# Patient Record
Sex: Female | Born: 1950 | Race: White | Hispanic: No | Smoking: Current every day smoker
Health system: Southern US, Community
[De-identification: ages and names within clinical notes are randomized; demographics above are authoritative.]

## PROBLEM LIST (undated history)

## (undated) DIAGNOSIS — R7989 Other specified abnormal findings of blood chemistry: Secondary | ICD-10-CM

## (undated) DIAGNOSIS — H269 Unspecified cataract: Secondary | ICD-10-CM

## (undated) DIAGNOSIS — K5900 Constipation, unspecified: Secondary | ICD-10-CM

## (undated) DIAGNOSIS — M810 Age-related osteoporosis without current pathological fracture: Secondary | ICD-10-CM

## (undated) DIAGNOSIS — F32A Depression, unspecified: Secondary | ICD-10-CM

## (undated) DIAGNOSIS — I6529 Occlusion and stenosis of unspecified carotid artery: Secondary | ICD-10-CM

## (undated) DIAGNOSIS — F329 Major depressive disorder, single episode, unspecified: Secondary | ICD-10-CM

## (undated) DIAGNOSIS — E7800 Pure hypercholesterolemia, unspecified: Secondary | ICD-10-CM

## (undated) DIAGNOSIS — R011 Cardiac murmur, unspecified: Secondary | ICD-10-CM

## (undated) DIAGNOSIS — M1990 Unspecified osteoarthritis, unspecified site: Secondary | ICD-10-CM

## (undated) DIAGNOSIS — I10 Essential (primary) hypertension: Secondary | ICD-10-CM

## (undated) HISTORY — DX: Other specified abnormal findings of blood chemistry: R79.89

## (undated) HISTORY — PX: KNEE SURGERY: SHX244

## (undated) HISTORY — DX: Cardiac murmur, unspecified: R01.1

## (undated) HISTORY — DX: Constipation, unspecified: K59.00

## (undated) HISTORY — PX: BREAST LUMPECTOMY: SHX2

## (undated) HISTORY — DX: Occlusion and stenosis of unspecified carotid artery: I65.29

## (undated) HISTORY — PX: ANKLE SURGERY: SHX546

## (undated) HISTORY — DX: Unspecified osteoarthritis, unspecified site: M19.90

## (undated) HISTORY — PX: POLYPECTOMY: SHX149

## (undated) HISTORY — DX: Age-related osteoporosis without current pathological fracture: M81.0

## (undated) HISTORY — DX: Unspecified cataract: H26.9

---

## 1998-02-18 ENCOUNTER — Other Ambulatory Visit: Admission: RE | Admit: 1998-02-18 | Discharge: 1998-02-18 | Payer: Self-pay | Admitting: *Deleted

## 1999-03-19 ENCOUNTER — Other Ambulatory Visit: Admission: RE | Admit: 1999-03-19 | Discharge: 1999-03-19 | Payer: Self-pay | Admitting: Obstetrics and Gynecology

## 2000-04-04 ENCOUNTER — Other Ambulatory Visit: Admission: RE | Admit: 2000-04-04 | Discharge: 2000-04-04 | Payer: Self-pay | Admitting: *Deleted

## 2001-04-26 ENCOUNTER — Other Ambulatory Visit: Admission: RE | Admit: 2001-04-26 | Discharge: 2001-04-26 | Payer: Self-pay | Admitting: *Deleted

## 2002-08-22 ENCOUNTER — Other Ambulatory Visit: Admission: RE | Admit: 2002-08-22 | Discharge: 2002-08-22 | Payer: Self-pay | Admitting: Obstetrics and Gynecology

## 2003-10-23 ENCOUNTER — Other Ambulatory Visit: Admission: RE | Admit: 2003-10-23 | Discharge: 2003-10-23 | Payer: Self-pay | Admitting: Obstetrics and Gynecology

## 2007-03-31 ENCOUNTER — Ambulatory Visit: Payer: Self-pay | Admitting: Internal Medicine

## 2007-04-13 ENCOUNTER — Encounter: Payer: Self-pay | Admitting: Internal Medicine

## 2007-04-13 ENCOUNTER — Ambulatory Visit: Payer: Self-pay | Admitting: Internal Medicine

## 2009-04-04 ENCOUNTER — Ambulatory Visit (HOSPITAL_BASED_OUTPATIENT_CLINIC_OR_DEPARTMENT_OTHER): Admission: RE | Admit: 2009-04-04 | Discharge: 2009-04-04 | Payer: Self-pay | Admitting: Orthopedic Surgery

## 2011-02-01 LAB — BASIC METABOLIC PANEL
CO2: 29 mEq/L (ref 19–32)
Calcium: 9.4 mg/dL (ref 8.4–10.5)
Creatinine, Ser: 0.74 mg/dL (ref 0.4–1.2)
GFR calc Af Amer: >60 mL/min (ref >60)
GFR calc non Af Amer: >60 mL/min (ref >60)

## 2011-03-09 NOTE — Op Note (Signed)
NAMESeleena, Reimers [name], M.D.   DATE OF BIRTH:  [birth year]   DATE OF PROCEDURE:  04/04/2009  DATE OF DISCHARGE:  04/04/2009                               OPERATIVE REPORT   PREOPERATIVE DIAGNOSIS:  Medial meniscal tear.   POSTOPERATIVE DIAGNOSES:  1. Medial meniscal tear.  2. Chondromalacia patella.   PROCEDURE:  1. Knee arthroscopy with debridement of medial meniscal tear.  2. Debridement of chondromalacia patellofemoral joint.   SURGEON:  Harvie Junior, [name]  We treated  conservatively for a period of time because of left knee pain.  Because  of continued complaints of left knee pain she was ultimately taken to  the operating room for operative knee arthroscopy after failure of all  conservative care.   PROCEDURE:  The patient was taken to the operating room.  After adequate  anesthesia was obtained with general anesthetic, the patient was placed  supine on the operating table.  The left leg was prepped and draped in a  sterile fashion.  Following this, a routine arthroscopic examination of  the knee revealed there was an obvious posterior horn medial meniscal  tear which was debrided back to a smooth and stable rim.  Once this was  completed, the attention was turned to the ACL which was normal.  Attention was turned to the lateral side normal.  Attention was turned  up in the patellofemoral joint where there was some grade 2 and grade 3  change in the patellofemoral joint which was debrided to a smooth and  stable rim of articular cartilage.  At this point, attention was turned  back medially where the medial meniscal tear was done.  It was  probed at  length to make sure and felt to be stable, to make sure there was no  tendency towards subluxation of the meniscal body and then no evidence  of fragmentation of cartilage or meniscus.  At this point, the knee was  copiously and thoroughly irrigated and suctioned dry.  The arthroscopic  portals were closed with a bandage.  Sterile compressive dressing was  applied.  The patient taken to recovery room where she was noted to be  in satisfactory condition.   ESTIMATED BLOOD LOSS DURING THE PROCEDURE:  None      Harvie Junior, M.D.  Electronically Signed     JLG/MEDQ  D:  05/05/2009  T:  05/06/2009  Job:  [id number]

## 2012-02-23 ENCOUNTER — Encounter: Payer: Self-pay | Admitting: Internal Medicine

## 2013-01-24 ENCOUNTER — Encounter: Payer: Self-pay | Admitting: Internal Medicine

## 2013-02-17 ENCOUNTER — Emergency Department (HOSPITAL_BASED_OUTPATIENT_CLINIC_OR_DEPARTMENT_OTHER): Payer: No Typology Code available for payment source

## 2013-02-17 ENCOUNTER — Encounter (HOSPITAL_BASED_OUTPATIENT_CLINIC_OR_DEPARTMENT_OTHER): Payer: Self-pay

## 2013-02-17 ENCOUNTER — Emergency Department (HOSPITAL_BASED_OUTPATIENT_CLINIC_OR_DEPARTMENT_OTHER)
Admission: EM | Admit: 2013-02-17 | Discharge: 2013-02-17 | Disposition: A | Discharge status: Home / Self Care | Payer: No Typology Code available for payment source | Attending: Emergency Medicine | Admitting: Emergency Medicine

## 2013-02-17 DIAGNOSIS — R079 Chest pain, unspecified: Secondary | ICD-10-CM

## 2013-02-17 DIAGNOSIS — M542 Cervicalgia: Secondary | ICD-10-CM

## 2013-02-17 DIAGNOSIS — Y9389 Activity, other specified: Secondary | ICD-10-CM | POA: Insufficient documentation

## 2013-02-17 DIAGNOSIS — S0990XA Unspecified injury of head, initial encounter: Secondary | ICD-10-CM | POA: Insufficient documentation

## 2013-02-17 DIAGNOSIS — S298XXA Other specified injuries of thorax, initial encounter: Secondary | ICD-10-CM | POA: Insufficient documentation

## 2013-02-17 DIAGNOSIS — Y92410 Unspecified street and highway as the place of occurrence of the external cause: Secondary | ICD-10-CM | POA: Insufficient documentation

## 2013-02-17 DIAGNOSIS — Z79899 Other long term (current) drug therapy: Secondary | ICD-10-CM | POA: Insufficient documentation

## 2013-02-17 DIAGNOSIS — I10 Essential (primary) hypertension: Secondary | ICD-10-CM | POA: Insufficient documentation

## 2013-02-17 HISTORY — DX: Essential (primary) hypertension: I10

## 2013-02-17 MED ORDER — METHOCARBAMOL 500 MG PO TABS: 500.0000 mg | ORAL_TABLET | Freq: Two times a day (BID) | ORAL | Status: DC

## 2013-02-17 MED ORDER — IBUPROFEN 800 MG PO TABS: 800.0000 mg | ORAL_TABLET | Freq: Three times a day (TID) | ORAL | Status: DC
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
## 2013-02-17 NOTE — ED Provider Notes (Signed)
Medical screening examination/treatment/procedure(s) were performed by non-physician practitioner and as supervising physician I was immediately available for consultation/collaboration.  Shelda Jakes, MD 02/17/13 [phone]

## 2013-02-21 ENCOUNTER — Encounter (HOSPITAL_BASED_OUTPATIENT_CLINIC_OR_DEPARTMENT_OTHER): Payer: Self-pay | Admitting: *Deleted

## 2013-02-21 ENCOUNTER — Emergency Department (HOSPITAL_BASED_OUTPATIENT_CLINIC_OR_DEPARTMENT_OTHER)
Admission: EM | Admit: 2013-02-21 | Discharge: 2013-02-21 | Disposition: A | Discharge status: Home / Self Care | Payer: Federal, State, Local not specified - PPO | Attending: Emergency Medicine | Admitting: Emergency Medicine

## 2013-02-21 DIAGNOSIS — H538 Other visual disturbances: Secondary | ICD-10-CM | POA: Insufficient documentation

## 2013-02-21 DIAGNOSIS — Z79899 Other long term (current) drug therapy: Secondary | ICD-10-CM | POA: Insufficient documentation

## 2013-02-21 DIAGNOSIS — I10 Essential (primary) hypertension: Secondary | ICD-10-CM | POA: Insufficient documentation

## 2013-02-21 DIAGNOSIS — H5789 Other specified disorders of eye and adnexa: Secondary | ICD-10-CM | POA: Insufficient documentation

## 2013-02-21 DIAGNOSIS — H02846 Edema of left eye, unspecified eyelid: Secondary | ICD-10-CM

## 2013-02-21 MED ORDER — DIPHENHYDRAMINE HCL 25 MG PO CAPS
50.0000 mg | ORAL_CAPSULE | Freq: Once | ORAL | Status: AC
  Administered 2013-02-21: 50 mg via ORAL
  Filled 2013-02-21: qty 2

## 2013-02-21 NOTE — ED Provider Notes (Signed)
  I performed a history and physical examination of Stacy Duncan and discussed her management with Dr. Elwyn Reach.  I agree with the history, physical, assessment, and plan of care, with the following exceptions: None  I was present for the following procedures: None Time Spent in Critical Care of the patient: None Time spent in discussions with the patient and family: 10    Stacy Axton Corlis Leak, MD 02/21/13 1328

## 2013-02-21 NOTE — ED Notes (Signed)
Pt declines hospital gown.
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
## 2013-05-16 ENCOUNTER — Encounter: Payer: Self-pay | Admitting: Internal Medicine

## 2013-06-12 DIAGNOSIS — F341 Dysthymic disorder: Secondary | ICD-10-CM | POA: Insufficient documentation

## 2013-07-10 ENCOUNTER — Encounter: Payer: Self-pay | Admitting: Internal Medicine

## 2013-07-18 ENCOUNTER — Ambulatory Visit (AMBULATORY_SURGERY_CENTER): Payer: Self-pay | Admitting: *Deleted

## 2013-07-18 VITALS — Ht 65.0 in | Wt 239.8 lb

## 2013-07-18 DIAGNOSIS — Z800 Family history of malignant neoplasm of digestive organs: Secondary | ICD-10-CM

## 2013-07-18 MED ORDER — NA SULFATE-K SULFATE-MG SULF 17.5-3.13-1.6 GM/177ML PO SOLN: ORAL | Status: DC

## 2013-07-18 NOTE — Progress Notes (Signed)
Patient denies any allergies to eggs or soy. Patient denies any problems with anesthesia.  

## 2013-07-20 ENCOUNTER — Encounter: Payer: Self-pay | Admitting: Internal Medicine

## 2013-07-24 ENCOUNTER — Telehealth: Payer: Self-pay | Admitting: Internal Medicine

## 2013-07-24 NOTE — Telephone Encounter (Signed)
Called into [facility]on [facility]. in [place]. a new Rx for Suprep 1 kit ,take as directed / per pt's request. Left message for the pt the RX was called into the pharmacy and to call our office if she has any problems.Cherylann Ratel

## 2013-07-31 ENCOUNTER — Ambulatory Visit (AMBULATORY_SURGERY_CENTER): Payer: Federal, State, Local not specified - PPO | Admitting: Internal Medicine

## 2013-07-31 ENCOUNTER — Encounter: Payer: Self-pay | Admitting: Internal Medicine

## 2013-07-31 VITALS — BP 155/77 | HR 60 | Temp 98.0°F | Resp 15 | Ht 65.0 in | Wt 239.0 lb

## 2013-07-31 DIAGNOSIS — D126 Benign neoplasm of colon, unspecified: Secondary | ICD-10-CM

## 2013-07-31 DIAGNOSIS — Z1211 Encounter for screening for malignant neoplasm of colon: Secondary | ICD-10-CM

## 2013-07-31 DIAGNOSIS — K5730 Diverticulosis of large intestine without perforation or abscess without bleeding: Secondary | ICD-10-CM

## 2013-07-31 DIAGNOSIS — K644 Residual hemorrhoidal skin tags: Secondary | ICD-10-CM

## 2013-07-31 DIAGNOSIS — K648 Other hemorrhoids: Secondary | ICD-10-CM

## 2013-07-31 DIAGNOSIS — Z800 Family history of malignant neoplasm of digestive organs: Secondary | ICD-10-CM

## 2013-07-31 MED ORDER — SODIUM CHLORIDE 0.9 % IV SOLN: 500.0000 mL | INTRAVENOUS | Status: DC

## 2013-07-31 NOTE — Patient Instructions (Addendum)
I found and removed one tiny polyp. It looks benign.   You also have a condition called diverticulosis - common and not usually a problem. Please read the handout provided. Hemorrhoids also seen.   I will let you know pathology results and when to have another routine colonoscopy by mail.  If you have hemorrhoid problems (swelling, itching, bleeding) I am able to treat those with an in-office procedure. If you like, please call my office at [phone] to schedule an appointment and I can evaluate you further.  I appreciate the opportunity to care for you. Iva Boop, MD, FACG   YOU HAD AN ENDOSCOPIC PROCEDURE TODAY AT THE [facility]: Refer to the procedure report that was given to you for any specific questions about what was found during the examination.  If the procedure report does not answer your questions, please call your gastroenterologist to clarify.  If you requested that your care partner not be given the details of your procedure findings, then the procedure report has been included in a sealed envelope for you to review at your convenience later.  YOU SHOULD EXPECT: Some feelings of bloating in the abdomen. Passage of more gas than usual.  Walking can help get rid of the air that was put into your GI tract during the procedure and reduce the bloating. If you had a lower endoscopy (such as a colonoscopy or flexible sigmoidoscopy) you may notice spotting of blood in your stool or on the toilet paper. If you underwent a bowel prep for your procedure, then you may not have a normal bowel movement for a few days.  DIET: Your first meal following the procedure should be a light meal and then it is ok to progress to your normal diet.  A half-sandwich or bowl of soup is an example of a good first meal.  Heavy or fried foods are harder to digest and may make you feel nauseous or bloated.  Likewise meals heavy in dairy and vegetables can cause extra gas to form and this can also  increase the bloating.  Drink plenty of fluids but you should avoid alcoholic beverages for 24 hours.  ACTIVITY: Your care partner should take you home directly after the procedure.  You should plan to take it easy, moving slowly for the rest of the day.  You can resume normal activity the day after the procedure however you should NOT DRIVE or use heavy machinery for 24 hours (because of the sedation medicines used during the test).    SYMPTOMS TO REPORT IMMEDIATELY: A gastroenterologist can be reached at any hour.  During normal business hours, 8:30 AM to 5:00 PM Monday through Friday, call [phone].  After hours and on weekends, please call the GI answering service at [phone]  Emergency number who will take a message and have the physician on call contact you.   Following lower endoscopy (colonoscopy or flexible sigmoidoscopy):  Excessive amounts of blood in the stool  Significant tenderness or worsening of abdominal pains  Swelling of the abdomen that is new, acute  Fever of 100F or higher F FOLLOW UP: If any biopsies were taken you will be contacted by phone or by letter within the next 1-3 weeks.  Call your gastroenterologist if you have not heard about the biopsies in 3 weeks.  Our staff will call the home number listed on your records the next business day following your procedure to check on you and address any questions or concerns  that you may have at that time regarding the information given to you following your procedure. This is a courtesy call and so if there is no answer at the home number and we have not heard from you through the emergency physician on call, we will assume that you have returned to your regular daily activities without incident.  SIGNATURES/CONFIDENTIALITY: You and/or your care partner have signed paperwork which will be entered into your electronic medical record.  These signatures attest to the fact that that the information above on your After  Visit Summary has been reviewed and is understood.  Full responsibility of the confidentiality of this discharge information lies with you and/or your care-partner.

## 2013-07-31 NOTE — Op Note (Signed)
[facility] [address] [place], [address]   COLONOSCOPY PROCEDURE REPORT  PATIENT: Stacy Duncan, Stacy Duncan  MR#: [id number] BIRTHDATE: 01/05/1951 , 61  yrs. old GENDER: Female ENDOSCOPIST: Iva Boop, MD, [facility] PROCEDURE DATE:  07/31/2013 PROCEDURE:   Colonoscopy with biopsy First Screening Colonoscopy - Avg.  risk and is 50 yrs.  old or older - No.  Prior Negative Screening - Now for repeat screening. N/A  History of Adenoma - Now for follow-up colonoscopy & has been > or = to 3 yrs.  N/A  Polyps Removed Today? Yes. ASA CLASS:   Class II INDICATIONS:Patient's immediate family history of colon cancer. MEDICATIONS: propofol (Diprivan) 250mg  IV, MAC sedation, administered by CRNA, and These medications were titrated to patient response per physician's verbal order  DESCRIPTION OF PROCEDURE:   After the risks benefits and alternatives of the procedure were thoroughly explained, informed consent was obtained.  A digital rectal exam revealed no abnormalities of the rectum.   The LB PFC-H190 [id number] and LB CF-H180AL Loaner [id number]  endoscope was introduced through the anus and advanced to the cecum, which was identified by both the appendix and ileocecal valve. No adverse events experienced.   The quality of the prep was excellent using Suprep  The instrument was then slowly withdrawn as the colon was fully examined.   COLON FINDINGS: A sessile polyp measuring 3 mm in size was found in the transverse colon.  A polypectomy was performed with cold forceps.  The resection was complete and the polyp tissue was completely retrieved.   Moderate diverticulosis was noted in the sigmoid colon.   The colon mucosa was otherwise normal.   A right colon retroflexion was performed.  Retroflexed views revealed internal/external hemorrhoids. The time to cecum=2 minutes 13 seconds.  Withdrawal time=7 minutes 49 seconds.  The scope was withdrawn and the procedure  completed. COMPLICATIONS: There were no complications.  ENDOSCOPIC IMPRESSION: 1.   Sessile polyp measuring 3 mm in size was found in the transverse colon; polypectomy was performed with cold forceps 2.   Moderate diverticulosis was noted in the sigmoid colon 3.   The colon mucosa was otherwise normal 4.   Internal hemorrhoids 5.   External hemorrhoids  RECOMMENDATIONS: Timing of repeat colonoscopy will be determined by pathology findings.   eSigned:  Iva Boop, MD, [facility] 07/31/2013 11:04 AM  cc: The Patient and Nolon Nations, MD

## 2013-07-31 NOTE — Progress Notes (Signed)
Called to room to assist during endoscopic procedure.  Patient ID and intended procedure confirmed with present staff. Received instructions for my participation in the procedure from the performing physician.  

## 2013-08-01 ENCOUNTER — Telehealth: Payer: Self-pay | Admitting: *Deleted

## 2013-08-01 NOTE — Telephone Encounter (Signed)
  Follow up Call-  Call back number 07/31/2013  Post procedure Call Back phone  # [phone]  Permission to leave phone message Yes     Patient questions:  Do you have a fever, pain , or abdominal swelling? no Pain Score  0 *  Have you tolerated food without any problems? yes  Have you been able to return to your normal activities? yes  Do you have any questions about your discharge instructions: Diet   no Medications  no Follow up visit  no  Do you have questions or concerns about your Care? no  Actions: * If pain score is 4 or above: No action needed, pain <4.

## 2013-08-08 ENCOUNTER — Encounter: Payer: Self-pay | Admitting: Internal Medicine

## 2013-08-08 DIAGNOSIS — Z860100 Personal history of colon polyps, unspecified: Secondary | ICD-10-CM | POA: Insufficient documentation

## 2013-08-08 DIAGNOSIS — Z800 Family history of malignant neoplasm of digestive organs: Secondary | ICD-10-CM | POA: Insufficient documentation

## 2013-08-08 NOTE — Progress Notes (Signed)
Quick Note:  Diminutive adenoma and FHx CRCA Repeat colonoscopy 2019 ______

## 2015-09-30 DIAGNOSIS — K8020 Calculus of gallbladder without cholecystitis without obstruction: Secondary | ICD-10-CM | POA: Insufficient documentation

## 2015-09-30 DIAGNOSIS — I6523 Occlusion and stenosis of bilateral carotid arteries: Secondary | ICD-10-CM | POA: Insufficient documentation

## 2016-10-26 DIAGNOSIS — E559 Vitamin D deficiency, unspecified: Secondary | ICD-10-CM | POA: Insufficient documentation

## 2016-10-27 ENCOUNTER — Ambulatory Visit: Payer: Medicare Other | Attending: Family Medicine | Admitting: Physical Therapy

## 2016-10-27 DIAGNOSIS — M25562 Pain in left knee: Secondary | ICD-10-CM | POA: Insufficient documentation

## 2016-10-27 DIAGNOSIS — R2689 Other abnormalities of gait and mobility: Secondary | ICD-10-CM | POA: Insufficient documentation

## 2016-10-27 DIAGNOSIS — G8929 Other chronic pain: Secondary | ICD-10-CM | POA: Diagnosis present

## 2016-10-27 DIAGNOSIS — R262 Difficulty in walking, not elsewhere classified: Secondary | ICD-10-CM | POA: Diagnosis present

## 2016-10-27 DIAGNOSIS — M6281 Muscle weakness (generalized): Secondary | ICD-10-CM | POA: Insufficient documentation

## 2016-10-27 DIAGNOSIS — M25561 Pain in right knee: Secondary | ICD-10-CM | POA: Diagnosis present

## 2016-10-27 NOTE — Therapy (Signed)
[facility] [address]  [place], [place], [address] Phone: [phone]   Fax:  [phone]  Physical Therapy Evaluation  Patient Details  Name: Stacy Duncan MRN: [id number] Date of Birth: [birth year] Referring Provider: Gentry Fitz, MD  Encounter Date: 10/27/2016      PT End of Session - 10/27/16 1201    Visit Number 1   Number of Visits 12   Date for PT Re-Evaluation 12/24/16   Authorization Type Medicare/Federal BCBS   Authorization - Number of Visits [place]   PT Start Time 1106   PT Stop Time 1148   PT Time Calculation (min) 42 min   Activity Tolerance Patient tolerated treatment well;Patient limited by pain   Behavior During Therapy [facility] for tasks assessed/performed      Past Medical History:  Diagnosis Date  . Cataract   . Hypertension     Past Surgical History:  Procedure Laterality Date  . ANKLE SURGERY    . BREAST LUMPECTOMY    . KNEE SURGERY      There were no vitals filed for this visit.       Subjective Assessment - 10/27/16 1110    Subjective Pt reports h/o L knee mensical tear which required arthroscopic surgery in ~2009. 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    Pertinent History L knee scope for meniscal tear 2009   Limitations Standing;Walking   How long can you stand comfortably? 15 minutes   How long can you walk comfortably? 15 minutes   Diagnostic tests x-rays taken at MD office show OA (per pt)   Patient Stated Goals "to get my balance back and walk better"   Currently in Pain? Yes   Pain Score --  6-7/10; least 4/10, avg 6-7/10, worst 10/10   Pain Location Knee   Pain Orientation Left   Pain Descriptors / Indicators Jabbing;Dull   Pain Type Chronic pain   Pain Radiating Towards n/a   Pain Onset More than a month ago  ~1 yr   Pain Frequency Constant   Aggravating Factors  activity - standing    Pain Relieving Factors ice, rest/elevation   Effect of Pain on Daily Activities needs to walk with cane, pain causes her to feel unsteady   Multiple Pain Sites Yes   Pain Score --  6-7/10; least 3/10, avg 5-6/10, worst 10/10   Pain Location Knee   Pain Orientation Right   Pain Descriptors / Indicators Sharp  grinding   Pain Type Chronic pain   Pain Radiating Towards down toward ankle (h/o ankle fracture)   Pain Onset More than a month ago  6 mo -1 yr   Pain Frequency Constant   Aggravating Factors  activity - standing   Pain Relieving Factors ice, rest/elevation   Effect of Pain on Daily Activities needs to walk with cane, pain causes her to feel unsteady            [facility] PT Assessment - 10/27/16 1106      Assessment   Medical Diagnosis B knee pain & arthritis   Referring Provider Gentry Fitz, MD   Onset Date/Surgical Date --  6 mo - 1 yr   Next MD Visit TBD   Prior Therapy remote h/o of OP PT after knee scope     Precautions   Precautions Fall     Balance Screen  Has the patient fallen in the past 6 months Yes   How many times? 1   Has the patient had a decrease in activity level because of a fear of falling?  Yes   Is the patient reluctant to leave their home because of a fear of falling?  No     Home Environment   Living Environment Private residence   Type of [place] Access Stairs to enter   Entrance Stairs-Number of Steps 2   Entrance Stairs-Rails Left   Home Layout One level   [place] - single point     Prior Function   Level of Independence Independent   Vocation Retired     Observation/Other Assessments   Focus on Therapeutic Outcomes (FOTO)  Knee - 30% (70% limitation); predicted 45% (55% limitation)     ROM / Strength   AROM / PROM / Strength AROM;PROM;Strength     AROM   AROM Assessment Site Knee   Right/Left Knee Right;Left   Right Knee Extension 2   Right Knee Flexion 119   Left Knee Extension 16   Left  Knee Flexion 100     PROM   PROM Assessment Site Knee   Right/Left Knee Right;Left   Right Knee Extension -3   Right Knee Flexion 126   Left Knee Extension -2   Left Knee Flexion 126     Strength   Strength Assessment Site Hip;Knee   Right/Left Hip Right;Left   Right Hip Flexion 4/5   Right Hip Extension 3+/5   Right Hip External Rotation  4-/5   Right Hip Internal Rotation 3+/5   Right Hip ABduction 4-/5   Right Hip ADduction 4/5   Left Hip Flexion 4/5   Left Hip Extension 4-/5   Left Hip External Rotation 4-/5   Left Hip Internal Rotation 3+/5   Left Hip ABduction 4-/5   Left Hip ADduction 4-/5   Right/Left Knee Right;Left   Right Knee Flexion 4-/5   Right Knee Extension 4/5   Left Knee Flexion 4-/5   Left Knee Extension 4/5     Flexibility   Soft Tissue Assessment /Muscle Length yes   Hamstrings mildly tight B   ITB mildly tight B   Piriformis mildly tight B     Ambulation/Gait   Assistive device Straight cane   Gait Pattern Decreased weight shift to left;Decreased arm swing - left;Decreased hip/knee flexion - left;Decreased hip/knee flexion - right;Poor foot clearance - left;Poor foot clearance - right;Antalgic   Ambulation Surface Level;Indoor                           PT Education - 10/27/16 1200    Education provided Yes   Education Details PT eval findings & POC   Person(s) Educated Patient   Methods Explanation   Comprehension Verbalized understanding          PT Short Term Goals - 10/27/16 1225      PT SHORT TERM GOAL #1   Title Independent with initial HEP by 11/19/16   Status New           PT Long Term Goals - 10/27/16 1225      PT LONG TERM GOAL #1   Title Independent with advanced HEP by 12/24/16   Status New     PT LONG TERM GOAL #2   Title B hip & knee strength >/= 4/5 for improved joint stability by  12/24/16   Status New     PT LONG TERM GOAL #3   Title B knee AROM to w/in 5 dg of PROM to promote normalized  gait and stair negotiation by 12/24/16   Status New     PT LONG TERM GOAL #4   Title Pt able to demonstrate normal gait pattern w/o AD by 12/24/16   Status New     PT LONG TERM GOAL #5   Title Pt will report overall improvement in B knee pain by 50% by 12/24/16   Status New               Plan - November 05, 2016 1205    Clinical Impression Statement Stacy Duncan is a 66 y/o female who presents to OP PT for B knee pain from arthritis. Pt reports pain has worsened over the past 6 months to a year and limits her ability to walk and requires her to use a SPC. Pt also notes difficulty with transfers sit to/from stand. Pt reports pain is constant in B knees but varies with activity. Assessment reveals deficits in AROM as compared to available PROM along with B LE weakness (refer to above flowsheet). Pt demonstrates significant gait deviations from pain and weakness but hopes to be able to wean from cane. POC will focus on restoring full AROM, core/LE strengthening to improve joint stability and balance, with modalities PRN for pain management.   Rehab Potential Good   Clinical Impairments Affecting Rehab Potential h/o R ankle fracture   PT Frequency 2x / week   PT Duration 6 weeks   PT Treatment/Interventions Patient/family education;Therapeutic exercise;Therapeutic activities;Functional mobility training;Neuromuscular re-education;Gait training;Stair training;Manual techniques;Taping;Passive range of motion;Electrical Stimulation;Cryotherapy;Vasopneumatic Device;ADLs/Self Care Home Management   PT Next Visit Plan Initiate HEP; B knee AROM; LE flexibility; Core/LE strengthening; Gait training to normalize gait pattern; Modalities PRN for pain   Consulted and Agree with Plan of Care Patient      Patient will benefit from skilled therapeutic intervention in order to improve the following deficits and impairments:  Pain, Decreased strength, Decreased range of motion, Impaired flexibility, Abnormal gait, Difficulty  walking, Decreased activity tolerance, Decreased balance, Decreased mobility  Visit Diagnosis: Chronic pain of left knee  Chronic pain of right knee  Muscle weakness (generalized)  Difficulty in walking, not elsewhere classified  Other abnormalities of gait and mobility      G-Codes - 11-05-2016 1155    Functional Assessment Tool Used Knee - 30% (70% limitation); predicted 45% (55% limitation)   Functional Limitation Mobility: Walking and moving around   Mobility: Walking and Moving Around Current Status [id number]) At least 60 percent but less than 80 percent impaired, limited or restricted   Mobility: Walking and Moving Around Goal Status [phone]) At least 40 percent but less than 60 percent impaired, limited or restricted       Problem List Patient Active Problem List   Diagnosis Date Noted  . Personal history of colonic adenoma 08/08/2013  . Family history of colon cancer 08/08/2013    Percival Spanish, PT, MPT 11/05/2016, 12:43 PM  [facility] [address]  [place], [place], [address] Phone: [phone]   Fax:  [phone]  Name: Stacy Duncan MRN: [id number] Date of Birth: Jan 12, 1951

## 2016-10-28 ENCOUNTER — Ambulatory Visit: Payer: Medicare Other

## 2016-10-28 DIAGNOSIS — M6281 Muscle weakness (generalized): Secondary | ICD-10-CM

## 2016-10-28 DIAGNOSIS — M25562 Pain in left knee: Secondary | ICD-10-CM | POA: Diagnosis not present

## 2016-10-28 DIAGNOSIS — M25561 Pain in right knee: Secondary | ICD-10-CM

## 2016-10-28 DIAGNOSIS — R2689 Other abnormalities of gait and mobility: Secondary | ICD-10-CM

## 2016-10-28 DIAGNOSIS — R262 Difficulty in walking, not elsewhere classified: Secondary | ICD-10-CM

## 2016-10-28 DIAGNOSIS — G8929 Other chronic pain: Secondary | ICD-10-CM

## 2016-10-28 NOTE — Therapy (Deleted)
[facility] [address]  [place], [place], [address] Phone: [phone]   Fax:  [phone]  Physical Therapy Treatment  Patient Details  Name: Stacy Duncan MRN: [id number] Date of Birth: [birth year] Referring Provider: Gentry Fitz, MD  Encounter Date: 10/28/2016      PT End of Session - 10/28/16 1110    Visit Number 2   Number of Visits 12   Date for PT Re-Evaluation 12/24/16   Authorization Type Medicare/Federal BCBS   Authorization - Number of Visits 75  Federal [employer]   PT Start Time 1104   PT Stop Time 1150   PT Time Calculation (min) 46 min   Activity Tolerance Patient tolerated treatment well;Patient limited by pain   Behavior During Therapy [facility] for tasks assessed/performed      Past Medical History:  Diagnosis Date  . Cataract   . Hypertension     Past Surgical History:  Procedure Laterality Date  . ANKLE SURGERY    . BREAST LUMPECTOMY    . KNEE SURGERY      There were no vitals filed for this visit.      Subjective Assessment - 10/28/16 1107    Subjective Pt. reporting R knee pain is worse than L today.       Patient Stated Goals "to get my balance back and walk better"   Currently in Pain? Yes   Pain Score 4    Pain Location Knee   Pain Orientation Right   Pain Descriptors / Indicators Jabbing;Dull   Pain Type Chronic pain   Pain Onset More than a month ago   Pain Frequency Intermittent   Multiple Pain Sites No                         [facility] Adult PT Treatment/Exercise - 10/28/16 1210      Knee/Hip Exercises: Aerobic   Nustep NuStep: lvl 3, 6 min      Knee/Hip Exercises: Supine   Hip Adduction Isometric AROM;Both;10 reps   Hip Adduction Isometric Limitations 5" hold with ball squeeze  5" hold; added to HEP    Bridges AROM;1 set;10 reps;Both  3" hold; added to HEP    Bridges with [employer] AROM;1 set;10 reps;Both  5" hold; added to HEP    Bridges  with [employer] AROM;Right;Left;1 set;10 reps  with green TB alternating hip abd/ER; added to HEP    Other Supine Knee/Hip Exercises Hooklying hip abduction/ER with green TB x 10 reps each side   added to HEP      Knee/Hip Exercises: Sidelying   Clams with green TB 3" hold   added to HEP      Kinesiotix   Create Space Chrondromalacia patellar pattern to B knees - 2 strips 30% medial and lateral to patella crossing at tibial tuberosity & distal quad + horiz strip 50% across patellar tendon                  PT Short Term Goals - 10/28/16 1110      PT SHORT TERM GOAL #1   Title Independent with initial HEP by 11/19/16   Status On-going           PT Long Term Goals - 10/28/16 1121      PT LONG TERM GOAL #1   Title Independent with advanced HEP by 12/24/16   Status On-going     PT LONG TERM GOAL #  2   Title B hip & knee strength >/= 4/5 for improved joint stability by 12/24/16   Status On-going     PT LONG TERM GOAL #3   Title B knee AROM to w/in 5 dg of PROM to promote normalized gait and stair negotiation by 12/24/16   Status On-going     PT LONG TERM GOAL #4   Title Pt able to demonstrate normal gait pattern w/o AD by 12/24/16   Status On-going     PT LONG TERM GOAL #5   Title Pt will report overall improvement in B knee pain by 50% by 12/24/16   Status On-going               Plan - 10/28/16 1127    Clinical Impression Statement Today's treatment focused on HEP creation/demonstration for hip/LE strengthening.  Bridge and bridge variations included in HEP today with pt. able to perform all.  Pt. with bilateral HS cramping x 2 today requiring rest break with therex however these quickly subsided.  Pt. agreeing to trial of taping to bilateral knees thus taping applied to Bilateral knees applied in chondromalacia patella pattern.  Will plan to monitor response to B knee taping and initial HEP next visit.      PT Treatment/Interventions Patient/family  education;Therapeutic exercise;Therapeutic activities;Functional mobility training;Neuromuscular re-education;Gait training;Stair training;Manual techniques;Taping;Passive range of motion;Electrical Stimulation;Cryotherapy;Vasopneumatic Device;ADLs/Self Care Home Management   PT Next Visit Plan Initiate HEP; B knee AROM; LE flexibility; Core/LE strengthening; Gait training to normalize gait pattern; Modalities PRN for pain      Patient will benefit from skilled therapeutic intervention in order to improve the following deficits and impairments:  Pain, Decreased strength, Decreased range of motion, Impaired flexibility, Abnormal gait, Difficulty walking, Decreased activity tolerance, Decreased balance, Decreased mobility  Visit Diagnosis: Chronic pain of left knee  Chronic pain of right knee  Muscle weakness (generalized)  Difficulty in walking, not elsewhere classified  Other abnormalities of gait and mobility     Problem List Patient Active Problem List   Diagnosis Date Noted  . Personal history of colonic adenoma 08/08/2013  . Family history of colon cancer 08/08/2013    Bess Harvest, PTA 10/28/16 12:17 PM  [facility] [address]  [place], [place], [address] Phone: [phone]   Fax:  [phone]  Name: Stacy Duncan MRN: [id number] Date of Birth: August 09, 1951

## 2016-10-28 NOTE — Therapy (Signed)
[facility] [address]  [place], [place], [address] Phone: [phone]   Fax:  [phone]  Physical Therapy Treatment  Patient Details  Name: Stacy Duncan MRN: [id number] Date of Birth: 11-22-1950 Referring Provider: Gentry Fitz, MD  Encounter Date: 10/28/2016      PT End of Session - 10/28/16 1110    Visit Number 2   Number of Visits 12   Date for PT Re-Evaluation 12/24/16   Authorization Type Medicare/Federal BCBS   Authorization - Number of Visits 75  Federal [employer]BS   PT Start Time 1104   PT Stop Time 1150   PT Time Calculation (min) 46 min   Activity Tolerance Patient tolerated treatment well;Patient limited by pain   Behavior During Therapy [facility] for tasks assessed/performed      Past Medical History:  Diagnosis Date  . Cataract   . Hypertension     Past Surgical History:  Procedure Laterality Date  . ANKLE SURGERY    . BREAST LUMPECTOMY    . KNEE SURGERY      There were no vitals filed for this visit.      Subjective Assessment - 10/28/16 1107    Subjective Pt. reporting R knee pain is worse than L today.       Patient Stated Goals "to get my balance back and walk better"   Currently in Pain? Yes   Pain Score 4    Pain Location Knee   Pain Orientation Right   Pain Descriptors / Indicators Jabbing;Dull   Pain Type Chronic pain   Pain Onset More than a month ago   Pain Frequency Intermittent   Multiple Pain Sites No            [facility] Adult PT Treatment/Exercise - 10/28/16 1210      Knee/Hip Exercises: Aerobic   Nustep NuStep: lvl 3, 6 min      Knee/Hip Exercises: Supine   Hip Adduction Isometric AROM;Both;10 reps   Hip Adduction Isometric Limitations 5" hold with ball squeeze  5" hold; added to HEP    Bridges AROM;1 set;10 reps;Both  3" hold; added to HEP    Bridges with [employer] AROM;1 set;10 reps;Both  5" hold; added to HEP    Bridges with [employer]  AROM;Right;Left;1 set;10 reps  with green TB alternating hip abd/ER; added to HEP    Other Supine Knee/Hip Exercises Hooklying hip abduction/ER with green TB x 10 reps each side   added to HEP      Knee/Hip Exercises: Sidelying   Clams with green TB 3" hold   added to HEP      Kinesiotix   Create Space Chrondromalacia patellar pattern to B knees - 2 strips 30% medial and lateral to patella crossing at tibial tuberosity & distal quad + horiz strip 50% across patellar tendon           PT Education - 10/28/16 1225    Education provided Yes   Education Details seated hip flexion march, bridge, bridge adduction ball squeeze, bridge with sustained hip abd/ER with green TB issued to pt., Hooklying alternating hip abd/ER with green TB    Person(s) Educated Patient   Methods Explanation;Demonstration;Verbal cues;Handout   Comprehension Verbalized understanding;Returned demonstration;Verbal cues required;Need further instruction          PT Short Term Goals - 10/28/16 1110      PT SHORT TERM GOAL #1   Title Independent with initial HEP by 11/19/16  Status On-going           PT Long Term Goals - 10/28/16 1121      PT LONG TERM GOAL #1   Title Independent with advanced HEP by 12/24/16   Status On-going     PT LONG TERM GOAL #2   Title B hip & knee strength >/= 4/5 for improved joint stability by 12/24/16   Status On-going     PT LONG TERM GOAL #3   Title B knee AROM to w/in 5 dg of PROM to promote normalized gait and stair negotiation by 12/24/16   Status On-going     PT LONG TERM GOAL #4   Title Pt able to demonstrate normal gait pattern w/o AD by 12/24/16   Status On-going     PT LONG TERM GOAL #5   Title Pt will report overall improvement in B knee pain by 50% by 12/24/16   Status On-going               Plan - 10/28/16 1127    Clinical Impression Statement Today's treatment focused on HEP creation/demonstration for hip/LE strengthening.  Bridge and bridge  variations included in HEP today with pt. able to perform all.  Pt. with bilateral HS cramping x 2 today requiring rest break with therex however these quickly subsided.  Pt. agreeing to trial of taping to bilateral knees thus taping applied to Bilateral knees applied in chondromalacia patella pattern.  Will plan to monitor response to B knee taping and initial HEP next visit.      PT Treatment/Interventions Patient/family education;Therapeutic exercise;Therapeutic activities;Functional mobility training;Neuromuscular re-education;Gait training;Stair training;Manual techniques;Taping;Passive range of motion;Electrical Stimulation;Cryotherapy;Vasopneumatic Device;ADLs/Self Care Home Management   PT Next Visit Plan Review initial HEP; B knee AROM; LE flexibility; Core/LE strengthening; Gait training to normalize gait pattern; Modalities PRN for pain      Patient will benefit from skilled therapeutic intervention in order to improve the following deficits and impairments:  Pain, Decreased strength, Decreased range of motion, Impaired flexibility, Abnormal gait, Difficulty walking, Decreased activity tolerance, Decreased balance, Decreased mobility  Visit Diagnosis: Chronic pain of left knee  Chronic pain of right knee  Muscle weakness (generalized)  Difficulty in walking, not elsewhere classified  Other abnormalities of gait and mobility     Problem List Patient Active Problem List   Diagnosis Date Noted  . Personal history of colonic adenoma 08/08/2013  . Family history of colon cancer 08/08/2013    Bess Harvest, PTA 10/28/16 12:29 PM  [facility] [address]  [place], [place], [address] Phone: [phone]   Fax:  [phone]  Name: Stacy Duncan MRN: [id number] Date of Birth: [birth year]

## 2016-11-01 ENCOUNTER — Ambulatory Visit: Payer: Medicare Other

## 2016-11-01 DIAGNOSIS — G8929 Other chronic pain: Secondary | ICD-10-CM

## 2016-11-01 DIAGNOSIS — R2689 Other abnormalities of gait and mobility: Secondary | ICD-10-CM

## 2016-11-01 DIAGNOSIS — M25562 Pain in left knee: Secondary | ICD-10-CM | POA: Diagnosis not present

## 2016-11-01 DIAGNOSIS — R262 Difficulty in walking, not elsewhere classified: Secondary | ICD-10-CM

## 2016-11-01 DIAGNOSIS — M6281 Muscle weakness (generalized): Secondary | ICD-10-CM

## 2016-11-01 DIAGNOSIS — M25561 Pain in right knee: Secondary | ICD-10-CM

## 2016-11-01 NOTE — Therapy (Signed)
[facility] [address]  [place], [place], [address] Phone: [phone]   Fax:  [phone]  Physical Therapy Treatment  Patient Details  Name: Stacy Duncan MRN: [id number] Date of Birth: [birth year] Referring Provider: Gentry Fitz, MD  Encounter Date: 11/01/2016      PT End of Session - 11/01/16 1021    Visit Number 3   Number of Visits 12   Date for PT Re-Evaluation 12/24/16   Authorization Type Medicare/Federal BCBS   Authorization - Number of Visits 75  Federal BCBS   PT Start Time 1014   PT Stop Time 1100   PT Time Calculation (min) 46 min   Activity Tolerance Patient tolerated treatment well;Patient limited by pain   Behavior During Therapy [facility] for tasks assessed/performed      Past Medical History:  Diagnosis Date  . Cataract   . Hypertension     Past Surgical History:  Procedure Laterality Date  . ANKLE SURGERY    . BREAST LUMPECTOMY    . KNEE SURGERY      There were no vitals filed for this visit.      Subjective Assessment - 11/01/16 1018    Subjective Pt. reporting she has been performing the HEP without issue over weekend.   Pt. reporting plans to be gone from therapy January 14-22nd.     Patient Stated Goals "to get my balance back and walk better"   Pain Score 6    Pain Location Knee   Pain Orientation Left   Pain Descriptors / Indicators Jabbing;Dull   Pain Type Chronic pain   Pain Onset More than a month ago   Pain Frequency Intermittent   Multiple Pain Sites No            [facility] Adult PT Treatment/Exercise - 11/01/16 1043      Knee/Hip Exercises: Stretches   Active Hamstring Stretch 1 rep;30 seconds   Active Hamstring Stretch Limitations with strap; added to HEP   ITB Stretch 1 rep;30 seconds   ITB Stretch Limitations with strap; added to HEP    Piriformis Stretch 1 rep;30 seconds   Piriformis Stretch Limitations added to HEP; mod piriformis; Bilaterally       Knee/Hip Exercises: Aerobic   Nustep NuStep: lvl 6, 7 min      Knee/Hip Exercises: Seated   Long Arc Quad AROM;10 reps   Long Arc Quad Weight 3 lbs.   Long [employer] Limitations with adduction ball squeeze; Bilaterally   Other Seated Knee/Hip Exercises Seated B fitter leg press (1 black band) x 10 reps     Knee/Hip Exercises: Supine   Hip Adduction Isometric AROM;Both;15 reps   Hip Adduction Isometric Limitations 3" hold    Bridges with Diona Foley Squeeze AROM;1 set;Both;15 reps   Bridges with Clamshell AROM;Right;Left;1 set  x 12 reps; with green TB      Knee/Hip Exercises: Sidelying   Clams x 12 repswith green TB 3" hold      Kinesiotix   Create Space --  B horizontal strips lateral to medial over tibial tuberosity                PT Education - 11/01/16 1035    Education provided Yes   Education Details B ITB stretch, B HS stretch, B modified piriformis stretch (pt. has strap)   Person(s) Educated Patient   Methods Explanation;Demonstration;Verbal cues;Handout   Comprehension Verbalized understanding;Returned demonstration;Verbal cues required;Need further instruction  PT Short Term Goals - 10/28/16 1110      PT SHORT TERM GOAL #1   Title Independent with initial HEP by 11/19/16   Status On-going           PT Long Term Goals - 10/28/16 1121      PT LONG TERM GOAL #1   Title Independent with advanced HEP by 12/24/16   Status On-going     PT LONG TERM GOAL #2   Title B hip & knee strength >/= 4/5 for improved joint stability by 12/24/16   Status On-going     PT LONG TERM GOAL #3   Title B knee AROM to w/in 5 dg of PROM to promote normalized gait and stair negotiation by 12/24/16   Status On-going     PT LONG TERM GOAL #4   Title Pt able to demonstrate normal gait pattern w/o AD by 12/24/16   Status On-going     PT LONG TERM GOAL #5   Title Pt will report overall improvement in B knee pain by 50% by 12/24/16   Status On-going                Plan - 11/01/16 1038    Clinical Impression Statement Pt. reporting being able to perform initial strengthening HEP everyday with green looped TB without issue over weekend.  Pt. noting benefit from B knee taping with taping still intact with exception of reapplication of cross strips today.  Pt. reporting plans to be gone from therapy January 14-22nd.  Supine/seated hip and knee strengthening therex was the focus on today's visit with pt. tolerating all well.  L knee pain > R today however pain nearly unchanged with therex today.  All strengthening therex advanced today with addition of seated fitter leg press and LAQ 3#.  Pt. issued handout for LE flexibility HEP today and reports she has a strap at home to assist with stretches.  Pt. continues to be compliant and motivated with therapy at this point and will continues to benefit from therapy to improve LE strength, flexibility, and maximize function.     PT Treatment/Interventions Patient/family education;Therapeutic exercise;Therapeutic activities;Functional mobility training;Neuromuscular re-education;Gait training;Stair training;Manual techniques;Taping;Passive range of motion;Electrical Stimulation;Cryotherapy;Vasopneumatic Device;ADLs/Self Care Home Management   PT Next Visit Plan Review stretching HEP; B knee AROM; LE flexibility; Core/LE strengthening; Gait training to normalize gait pattern; Modalities PRN for pain      Patient will benefit from skilled therapeutic intervention in order to improve the following deficits and impairments:  Pain, Decreased strength, Decreased range of motion, Impaired flexibility, Abnormal gait, Difficulty walking, Decreased activity tolerance, Decreased balance, Decreased mobility  Visit Diagnosis: Chronic pain of left knee  Chronic pain of right knee  Muscle weakness (generalized)  Difficulty in walking, not elsewhere classified  Other abnormalities of gait and mobility     Problem List Patient  Active Problem List   Diagnosis Date Noted  . Personal history of colonic adenoma 08/08/2013  . Family history of colon cancer 08/08/2013    Bess Harvest, PTA 11/01/16 11:38 AM  [facility] [address]  [place], [place], [address] Phone: [phone]   Fax:  [phone]  Name: Stacy Duncan MRN: [id number] Date of Birth: [birth year]

## 2016-11-04 ENCOUNTER — Ambulatory Visit: Payer: Medicare Other | Admitting: Physical Therapy

## 2016-11-04 DIAGNOSIS — M25562 Pain in left knee: Principal | ICD-10-CM

## 2016-11-04 DIAGNOSIS — M25561 Pain in right knee: Secondary | ICD-10-CM

## 2016-11-04 DIAGNOSIS — G8929 Other chronic pain: Secondary | ICD-10-CM

## 2016-11-04 DIAGNOSIS — R2689 Other abnormalities of gait and mobility: Secondary | ICD-10-CM

## 2016-11-04 DIAGNOSIS — R262 Difficulty in walking, not elsewhere classified: Secondary | ICD-10-CM

## 2016-11-04 DIAGNOSIS — M6281 Muscle weakness (generalized): Secondary | ICD-10-CM

## 2016-11-04 NOTE — Therapy (Signed)
[facility] [address]  [place], [place], [address] Phone: [phone]   Fax:  [phone]  Physical Therapy Treatment  Patient Details  Name: Stacy Duncan MRN: [id number] Date of Birth: [birth year] Referring Provider: Gentry Fitz, MD  Encounter Date: 11/04/2016      PT End of Session - 11/04/16 1107    Visit Number 4   Number of Visits 12   Date for PT Re-Evaluation 12/24/16   Authorization Type Medicare/Federal BCBS   Authorization - Number of Visits 75   PT Start Time 1100   PT Stop Time 1148   PT Time Calculation (min) 48 min   Activity Tolerance Patient tolerated treatment well   Behavior During Therapy [facility] for tasks assessed/performed      Past Medical History:  Diagnosis Date  . Cataract   . Hypertension     Past Surgical History:  Procedure Laterality Date  . ANKLE SURGERY    . BREAST LUMPECTOMY    . KNEE SURGERY      There were no vitals filed for this visit.      Subjective Assessment - 11/04/16 1103    Subjective Pt reports she had reaction to pain medication and has hemorrhoids so has been unable to perform HEP. Both knees felt better with the tape following last session. L Knee is feeling painful today.   Patient Stated Goals "to get my balance back and walk better"   Currently in Pain? Yes   Pain Score 7    Pain Location Knee   Pain Orientation Left   Pain Descriptors / Indicators Dull;Sharp   Pain Frequency Intermittent                         [facility] Adult PT Treatment/Exercise - 11/04/16 1059      Knee/Hip Exercises: Stretches   Passive Hamstring Stretch 1 rep;30 seconds;Both   Passive Hamstring Stretch Limitations supine with strap    ITB Stretch 1 rep;30 seconds;Both   ITB Stretch Limitations supine with strap   Piriformis Stretch 1 rep;30 seconds;Both     Knee/Hip Exercises: Aerobic   Nustep NuStep: lvl 6 x 7'     Knee/Hip Exercises: Standing    Knee Flexion Both;1 set;10 reps   Knee Flexion Limitations  2#; counter for support     Knee/Hip Exercises: Seated   Other Seated Knee/Hip Exercises Seated B fitter leg press; 1 black  x 10 reps     Knee/Hip Exercises: Supine   Bridges with Clamshell AROM;Right;Left;1 set;10 reps   Straight Leg Raises Both;1 set;10 reps   Straight Leg Raises Limitations 2#      Knee/Hip Exercises: Sidelying   Clams x 10 reps with green TB 3" hold     Kinesiotix   Create Space Chrondromalacia patellar pattern to B knees - 2 strips 30% medial and lateral to patella crossing at tibial tuberosity & distal quad + horiz strip 50% across patellar tendon                  PT Short Term Goals - 10/28/16 1110      PT SHORT TERM GOAL #1   Title Independent with initial HEP by 11/19/16   Status On-going           PT Long Term Goals - 10/28/16 1121      PT LONG TERM GOAL #1   Title Independent with advanced HEP by 12/24/16  Status On-going     PT LONG TERM GOAL #2   Title B hip & knee strength >/= 4/5 for improved joint stability by 12/24/16   Status On-going     PT LONG TERM GOAL #3   Title B knee AROM to w/in 5 dg of PROM to promote normalized gait and stair negotiation by 12/24/16   Status On-going     PT LONG TERM GOAL #4   Title Pt able to demonstrate normal gait pattern w/o AD by 12/24/16   Status On-going     PT LONG TERM GOAL #5   Title Pt will report overall improvement in B knee pain by 50% by 12/24/16   Status On-going               Plan - 11/04/16 1146    Clinical Impression Statement Pt reported being unable to perform HEP over the last few days due to not feeling well. Pt was able to recall stretches recently added to HEP as well as receive clarification on a few exercises from the original HEP. Pt was able to tolerate new exercises focusing on hip & knee strengthening without an increase in pain during or following the session. Pt will be out of town next week but was  reminded to continue with current HEP. Pt reports that knees feel much better with walking following taping for chondromalacia patella.   Rehab Potential Good   PT Treatment/Interventions Patient/family education;Therapeutic exercise;Therapeutic activities;Functional mobility training;Neuromuscular re-education;Gait training;Stair training;Manual techniques;Taping;Passive range of motion;Electrical Stimulation;Cryotherapy;Vasopneumatic Device;ADLs/Self Care Home Management   PT Next Visit Plan Review how HEP went over break; progression of Core/LE strengthening; gait training to normalize gait pattern; modalities & manual therapy PRN for pain.   Consulted and Agree with Plan of Care Patient      Patient will benefit from skilled therapeutic intervention in order to improve the following deficits and impairments:  Pain, Decreased strength, Decreased range of motion, Impaired flexibility, Abnormal gait, Difficulty walking, Decreased activity tolerance, Decreased balance, Decreased mobility  Visit Diagnosis: Chronic pain of left knee  Chronic pain of right knee  Muscle weakness (generalized)  Difficulty in walking, not elsewhere classified  Other abnormalities of gait and mobility     Problem List Patient Active Problem List   Diagnosis Date Noted  . Personal history of colonic adenoma 08/08/2013  . Family history of colon cancer 08/08/2013    Lauralee Evener, SPT 11/04/2016, 1:21 PM  [facility] [address]  [place], [place], [address] Phone: [phone]   Fax:  [phone]  Name: Stacy Duncan MRN: [id number] Date of Birth: April 15, 1951

## 2016-11-17 ENCOUNTER — Ambulatory Visit: Payer: Medicare Other | Admitting: Physical Therapy

## 2016-11-17 DIAGNOSIS — M25562 Pain in left knee: Principal | ICD-10-CM

## 2016-11-17 DIAGNOSIS — G8929 Other chronic pain: Secondary | ICD-10-CM

## 2016-11-17 DIAGNOSIS — R2689 Other abnormalities of gait and mobility: Secondary | ICD-10-CM

## 2016-11-17 DIAGNOSIS — M25561 Pain in right knee: Secondary | ICD-10-CM

## 2016-11-17 DIAGNOSIS — R262 Difficulty in walking, not elsewhere classified: Secondary | ICD-10-CM

## 2016-11-17 DIAGNOSIS — M6281 Muscle weakness (generalized): Secondary | ICD-10-CM

## 2016-11-17 NOTE — Therapy (Signed)
[facility] [address]  [place], [place], [address] Phone: [phone]   Fax:  [phone]  Physical Therapy Treatment  Patient Details  Name: Stacy Duncan MRN: [id number] Date of Birth: [birth year] Referring Provider: Gentry Fitz, MD  Encounter Date: 11/17/2016      PT End of Session - 11/17/16 1109    Visit Number 5   Number of Visits 12   Date for PT Re-Evaluation 12/24/16   Authorization Type Medicare/Federal BCBS   Authorization - Number of Visits 32   PT Start Time 1103   PT Stop Time 1148   PT Time Calculation (min) 45 min   Activity Tolerance Patient tolerated treatment well   Behavior During Therapy [facility] for tasks assessed/performed      Past Medical History:  Diagnosis Date  . Cataract   . Hypertension     Past Surgical History:  Procedure Laterality Date  . ANKLE SURGERY    . BREAST LUMPECTOMY    . KNEE SURGERY      There were no vitals filed for this visit.      Subjective Assessment - 11/17/16 1107    Subjective Pt reports she has been sick over the past week and also rolled her L ankle on the ice. She states that her foot feels more flat than normal and that the ankle pain is a 6-7/10 which is an improvement from last Tuesday when it occurred. She has not gone to see anyone yet for her ankle. She reports her knees have been feeling "weak and wobbly". Pt reports she was able to do bridges and the hamstring stretch while away from therapy last week but was unable to do the other stretches & exercises due to being sick. Pt reports she did feel stronger and could tell a difference following last session.   Patient Stated Goals "to get my balance back and walk better"   Currently in Pain? Yes   Pain Score 4    Pain Location Knee   Pain Orientation Left   Pain Descriptors / Indicators Dull;Sharp   Pain Type Chronic pain   Pain Onset More than a month ago   Pain Frequency  Intermittent   Aggravating Factors  activity- standing   Pain Relieving Factors ice, rest/elevation   Effect of Pain on Daily Activities uses cane to steady when coming out of chair   Pain Score 4   Pain Location Knee   Pain Orientation Right   Pain Descriptors / Indicators Sharp   Pain Type Chronic pain   Pain Onset More than a month ago   Pain Frequency Constant   Aggravating Factors  activity- standing   Pain Relieving Factors ice, rest/elevation   Effect of Pain on Daily Activities uses cane to steady when getting out of chair                         [facility] Adult PT Treatment/Exercise - 11/17/16 1103      Knee/Hip Exercises: Aerobic   Nustep lvl 6 x 7'     Knee/Hip Exercises: Standing   Functional Squat 10 reps;3 seconds   Functional Squat Limitations Counter for B UE support   Other Standing Knee Exercises Side stepping with yellow band; 20' each side     Knee/Hip Exercises: Supine   Bridges Both;10 reps   Bridges Limitations 1st set: legs extended on peanut ball, 5 sec holds; 2nd set: hamstring  curl with legs on peanut ball (performed 2 reps before pain in knee occurred)     Knee/Hip Exercises: Sidelying   Hip ABduction Both;15 reps   Hip ABduction Limitations 3" holds, 2# ankle weight     Kinesiotix   Create Space Chrondromalacia patellar pattern to B knees - 2 strips 30% medial and lateral to patella crossing at tibial tuberosity & distal quad + horiz strip 50% across patellar tendon                  PT Short Term Goals - 10/28/16 1110      PT SHORT TERM GOAL #1   Title Independent with initial HEP by 11/19/16   Status On-going           PT Long Term Goals - 10/28/16 1121      PT LONG TERM GOAL #1   Title Independent with advanced HEP by 12/24/16   Status On-going     PT LONG TERM GOAL #2   Title B hip & knee strength >/= 4/5 for improved joint stability by 12/24/16   Status On-going     PT LONG TERM GOAL #3   Title B knee AROM  to w/in 5 dg of PROM to promote normalized gait and stair negotiation by 12/24/16   Status On-going     PT LONG TERM GOAL #4   Title Pt able to demonstrate normal gait pattern w/o AD by 12/24/16   Status On-going     PT LONG TERM GOAL #5   Title Pt will report overall improvement in B knee pain by 50% by 12/24/16   Status On-going               Plan - 11/17/16 1148    Clinical Impression Statement Pt was unable to perform HEP while gone from therapy last week due to having the flu. She was able to do the hamstring stretch and bridges a few times but not regularly. She sprained her L ankle last Tuesday on the ice and reports the pain being 6-7/10. Pt was able to tolerate progression of core/LE strengthening with only a few standing exercises included due to the L ankle sprain.  She was able to tolerate the few standing exercises well with only minimal knee pain when performing squats. She required cueing during squats to keep her knees behind her toes and to remain in an upright posture. She did not have any increased pain following treatment.    Rehab Potential Good   Clinical Impairments Affecting Rehab Potential h/o R ankle fracture   PT Treatment/Interventions Patient/family education;Therapeutic exercise;Therapeutic activities;Functional mobility training;Neuromuscular re-education;Gait training;Stair training;Manual techniques;Taping;Passive range of motion;Electrical Stimulation;Cryotherapy;Vasopneumatic Device;ADLs/Self Care Home Management   PT Next Visit Plan Assess short term goal; continue with progression of core/LE strengthening; try to introduce more standing exercises if able to tolerate; modalities and manual therapy PRN for pain   Consulted and Agree with Plan of Care Patient      Patient will benefit from skilled therapeutic intervention in order to improve the following deficits and impairments:  Pain, Decreased strength, Decreased range of motion, Impaired flexibility,  Abnormal gait, Difficulty walking, Decreased activity tolerance, Decreased balance, Decreased mobility  Visit Diagnosis: Chronic pain of left knee  Chronic pain of right knee  Muscle weakness (generalized)  Difficulty in walking, not elsewhere classified  Other abnormalities of gait and mobility     Problem List Patient Active Problem List   Diagnosis Date Noted  . Personal history  of colonic adenoma 08/08/2013  . Family history of colon cancer 08/08/2013    Lauralee Evener, SPT 11/17/2016, 12:18 PM  [facility] [address]  [place], [place], [address] Phone: [phone]   Fax:  [phone]  Name: Tamantha Sedberry MRN: [id number] Date of Birth: 1951/06/04

## 2016-11-19 ENCOUNTER — Ambulatory Visit: Payer: Medicare Other

## 2016-11-19 DIAGNOSIS — G8929 Other chronic pain: Secondary | ICD-10-CM

## 2016-11-19 DIAGNOSIS — M25562 Pain in left knee: Principal | ICD-10-CM

## 2016-11-19 DIAGNOSIS — M25561 Pain in right knee: Secondary | ICD-10-CM

## 2016-11-19 DIAGNOSIS — M6281 Muscle weakness (generalized): Secondary | ICD-10-CM

## 2016-11-19 DIAGNOSIS — R2689 Other abnormalities of gait and mobility: Secondary | ICD-10-CM

## 2016-11-19 DIAGNOSIS — R262 Difficulty in walking, not elsewhere classified: Secondary | ICD-10-CM

## 2016-11-19 NOTE — Therapy (Signed)
[facility] [address]  [place], [place], [address] Phone: [phone]   Fax:  [phone]  Physical Therapy Treatment  Patient Details  Name: Stacy Duncan MRN: [id number] Date of Birth: 1951-05-30 Referring Provider: Gentry Fitz, MD  Encounter Date: 11/19/2016      PT End of Session - 11/19/16 1109    Visit Number 6   Number of Visits 12   Date for PT Re-Evaluation 12/24/16   Authorization Type Medicare/Federal BCBS   Authorization - Number of Visits 75   PT Start Time 1105   PT Stop Time 1150   PT Time Calculation (min) 45 min   Activity Tolerance Patient tolerated treatment well   Behavior During Therapy [facility] for tasks assessed/performed      Past Medical History:  Diagnosis Date  . Cataract   . Hypertension     Past Surgical History:  Procedure Laterality Date  . ANKLE SURGERY    . BREAST LUMPECTOMY    . KNEE SURGERY      There were no vitals filed for this visit.      Subjective Assessment - 11/19/16 1109    Subjective Pt. reporting she has been more consistent with HEP since being sick two weeks ago.     Patient Stated Goals "to get my balance back and walk better"   Currently in Pain? Yes   Pain Score 6    Pain Location Knee   Pain Orientation Left   Pain Descriptors / Indicators Dull;Sharp   Pain Type Chronic pain   Pain Onset More than a month ago   Pain Frequency Intermittent   Aggravating Factors  use, prolonged walking   Pain Relieving Factors ice, rest/   Multiple Pain Sites No           [facility] Adult PT Treatment/Exercise - 11/19/16 1130      Knee/Hip Exercises: Aerobic   Nustep lvl 6 x 6'     Knee/Hip Exercises: Standing   Hip Flexion AROM;Stengthening;Right;Left;1 set;10 reps;Knee straight   Hip Flexion Limitations holding onto counter   Hip Abduction AROM;Right;Left;1 set;10 reps;Knee straight   Abduction Limitations Holding onto counter    Hip Extension  AROM;Stengthening;Right;Left;1 set;10 reps;Knee straight   Extension Limitations holding onto counter    Functional Squat 10 reps;3 seconds   Functional Squat Limitations Counter for B UE support; verbal cues provided proper LE spacing     Knee/Hip Exercises: Seated   Long Arc Quad AROM;15 reps   Long Arc Quad Weight 3 lbs.   Long [employer] Limitations with adduction ball squeeze; Bilaterally   Other Seated Knee/Hip Exercises Seated B fitter leg press; (1 black, 1blue) x 15 reps     Knee/Hip Exercises: Supine   Bridges with Ball Squeeze AROM;Both;1 set;15 reps   Bridges with Clamshell AROM;Strengthening;Right;Left;1 set;15 reps  with green TB    Straight Leg Raises --   Straight Leg Raises Limitations --   Other Supine Knee/Hip Exercises Hooklying alteranting hip abd/ER with green TB x 15 reps each side      Knee/Hip Exercises: Sidelying   Clams x 15 reps with green TB 3" hold            PT Short Term Goals - 11/19/16 1124      PT SHORT TERM GOAL #1   Title Independent with initial HEP by 11/19/16   Status Achieved  1.26.18: pt. excellent recall of initial HEP  PT Long Term Goals - 10/28/16 1121      PT LONG TERM GOAL #1   Title Independent with advanced HEP by 12/24/16   Status On-going     PT LONG TERM GOAL #2   Title B hip & knee strength >/= 4/5 for improved joint stability by 12/24/16   Status On-going     PT LONG TERM GOAL #3   Title B knee AROM to w/in 5 dg of PROM to promote normalized gait and stair negotiation by 12/24/16   Status On-going     PT LONG TERM GOAL #4   Title Pt able to demonstrate normal gait pattern w/o AD by 12/24/16   Status On-going     PT LONG TERM GOAL #5   Title Pt will report overall improvement in B knee pain by 50% by 12/24/16   Status On-going               Plan - 11/19/16 1114    Clinical Impression Statement Pt. still noting benefit from B knee taping at this point with taping still intact today.  Today's  treatment focused on full HEP review with demonstration of all strengthening activities to check for appropriate difficulty level following sickness with flue two weeks ago.  Pt. still reporting appropriate difficulty level with green TB with HEP with muscular soreness following performance.  Today's treatment with more standing hip strengthening activities, which was tolerated well.  Pt. with complaint of B knee stiffness and aching pain throughout treatment today with all therex.  Will plan to continue progressing strengthening activities per pt. tolerance in coming visits.    PT Treatment/Interventions Patient/family education;Therapeutic exercise;Therapeutic activities;Functional mobility training;Neuromuscular re-education;Gait training;Stair training;Manual techniques;Taping;Passive range of motion;Electrical Stimulation;Cryotherapy;Vasopneumatic Device;ADLs/Self Care Home Management   PT Next Visit Plan Continue with progression of core/LE strengthening; try to introduce more standing exercises if able to tolerate; modalities and manual therapy PRN for pain      Patient will benefit from skilled therapeutic intervention in order to improve the following deficits and impairments:  Pain, Decreased strength, Decreased range of motion, Impaired flexibility, Abnormal gait, Difficulty walking, Decreased activity tolerance, Decreased balance, Decreased mobility  Visit Diagnosis: Chronic pain of left knee  Chronic pain of right knee  Muscle weakness (generalized)  Difficulty in walking, not elsewhere classified  Other abnormalities of gait and mobility     Problem List Patient Active Problem List   Diagnosis Date Noted  . Personal history of colonic adenoma 08/08/2013  . Family history of colon cancer 08/08/2013    Bess Harvest, PTA 11/19/16 12:09 PM   [facility] [address]  [place], [place], [address] Phone:  [phone]   Fax:  [phone]  Name: Stacy Duncan MRN: [id number] Date of Birth: 04/26/1951

## 2016-11-22 ENCOUNTER — Ambulatory Visit: Payer: Medicare Other

## 2016-11-22 DIAGNOSIS — M6281 Muscle weakness (generalized): Secondary | ICD-10-CM

## 2016-11-22 DIAGNOSIS — M25562 Pain in left knee: Principal | ICD-10-CM

## 2016-11-22 DIAGNOSIS — M25561 Pain in right knee: Secondary | ICD-10-CM

## 2016-11-22 DIAGNOSIS — R2689 Other abnormalities of gait and mobility: Secondary | ICD-10-CM

## 2016-11-22 DIAGNOSIS — R262 Difficulty in walking, not elsewhere classified: Secondary | ICD-10-CM

## 2016-11-22 DIAGNOSIS — G8929 Other chronic pain: Secondary | ICD-10-CM

## 2016-11-22 NOTE — Therapy (Signed)
[facility] [address]  [place], [place], [address] Phone: [phone]   Fax:  [phone]  Physical Therapy Treatment  Patient Details  Name: Stacy Duncan MRN: [id number] Date of Birth: 01-Aug-1951 Referring Provider: Gentry Fitz, MD  Encounter Date: 11/22/2016      PT End of Session - 11/22/16 1108    Visit Number 7   Number of Visits 12   Date for PT Re-Evaluation 12/24/16   Authorization Type Medicare/Federal BCBS   Authorization - Number of Visits 25   PT Start Time 1103   PT Stop Time 1152   PT Time Calculation (min) 49 min   Activity Tolerance Patient tolerated treatment well   Behavior During Therapy [facility] for tasks assessed/performed      Past Medical History:  Diagnosis Date  . Cataract   . Hypertension     Past Surgical History:  Procedure Laterality Date  . ANKLE SURGERY    . BREAST LUMPECTOMY    . KNEE SURGERY      There were no vitals filed for this visit.      Subjective Assessment - 11/22/16 1105    Subjective Pt. reporting she was able to get to the HEP over weekend and felt good following last treatment.  Pt. still noting benefit from B taping however noting taping still intact with exception of R LE cross-strip.   Patient Stated Goals "to get my balance back and walk better"   Currently in Pain? Yes   Pain Score 5    Pain Location Knee   Pain Orientation Right;Left   Pain Descriptors / Indicators Dull;Sharp   Pain Type Chronic pain   Pain Onset More than a month ago   Pain Frequency Intermittent   Multiple Pain Sites No           [facility] Adult PT Treatment/Exercise - 11/22/16 1113      Knee/Hip Exercises: Aerobic   Nustep lvl 7 x 6'     Knee/Hip Exercises: Standing   Hip Flexion AROM;Stengthening;Right;Left;1 set;10 reps;Knee straight   Hip Flexion Limitations holding onto counter; with yellow looped TB   Hip Abduction AROM;Right;Left;1 set;10 reps;Knee  straight   Abduction Limitations Holding onto counter; with yellow looped TB    Hip Extension AROM;Stengthening;Right;Left;1 set;10 reps;Knee straight   Extension Limitations holding onto counter; with yellow TB    Functional Squat 3 seconds;15 reps   Functional Squat Limitations Counter for B UE support    Other Standing Knee Exercises Side stepping with red TB 2 x 20 ft; monster walk with red TB 2 x 20 ft      Knee/Hip Exercises: Seated   Long Arc Quad AROM;15 reps   Long Arc Quad Weight 4 lbs.   Long [employer] Limitations with adduction ball squeeze; Bilaterally   Other Seated Knee/Hip Exercises Seated B fitter leg press; (1 black, 1blue) x 20 reps   Hamstring Curl AROM;Strengthening;Right;Left;15 reps   Hamstring Limitations with black looped TB aroun ankles and anchored by therapist            PT Short Term Goals - 11/19/16 1124      PT SHORT TERM GOAL #1   Title Independent with initial HEP by 11/19/16   Status Achieved  1.26.18: pt. excellent recall of initial HEP           PT Long Term Goals - 10/28/16 1121      PT LONG TERM GOAL #1  Title Independent with advanced HEP by 12/24/16   Status On-going     PT LONG TERM GOAL #2   Title B hip & knee strength >/= 4/5 for improved joint stability by 12/24/16   Status On-going     PT LONG TERM GOAL #3   Title B knee AROM to w/in 5 dg of PROM to promote normalized gait and stair negotiation by 12/24/16   Status On-going     PT LONG TERM GOAL #4   Title Pt able to demonstrate normal gait pattern w/o AD by 12/24/16   Status On-going     PT LONG TERM GOAL #5   Title Pt will report overall improvement in B knee pain by 50% by 12/24/16   Status On-going               Plan - 11/22/16 1117    Clinical Impression Statement Pt. still noting benefit from B knee taping with taping still intact with exception of R LE cross-strip.  R LE cross-strip reapplied today.  Today's treatment focused on hip/knee strengthening activity  with advancement of side stepping and monster walk.  Pt. tolerating therex well reporting appropriate fatigue following treatment.  Pt. will continue to benefit from further skilled therapy to improve LE strength and functional capacity.      PT Treatment/Interventions Patient/family education;Therapeutic exercise;Therapeutic activities;Functional mobility training;Neuromuscular re-education;Gait training;Stair training;Manual techniques;Taping;Passive range of motion;Electrical Stimulation;Cryotherapy;Vasopneumatic Device;ADLs/Self Care Home Management   PT Next Visit Plan Continue with progression of core/LE strengthening; try to introduce more standing exercises if able to tolerate; modalities and manual therapy PRN for pain      Patient will benefit from skilled therapeutic intervention in order to improve the following deficits and impairments:  Pain, Decreased strength, Decreased range of motion, Impaired flexibility, Abnormal gait, Difficulty walking, Decreased activity tolerance, Decreased balance, Decreased mobility  Visit Diagnosis: Chronic pain of left knee  Chronic pain of right knee  Muscle weakness (generalized)  Difficulty in walking, not elsewhere classified  Other abnormalities of gait and mobility     Problem List Patient Active Problem List   Diagnosis Date Noted  . Personal history of colonic adenoma 08/08/2013  . Family history of colon cancer 08/08/2013    Bess Harvest, PTA 11/22/16 12:18 PM  [facility] [address]  [place], [place], [address] Phone: [phone]   Fax:  [phone]  Name: Stacy Duncan MRN: [id number] Date of Birth: 1951/04/12

## 2016-11-25 ENCOUNTER — Ambulatory Visit: Payer: Medicare Other | Attending: Family Medicine

## 2016-11-25 DIAGNOSIS — M6281 Muscle weakness (generalized): Secondary | ICD-10-CM | POA: Diagnosis present

## 2016-11-25 DIAGNOSIS — M25561 Pain in right knee: Secondary | ICD-10-CM | POA: Diagnosis present

## 2016-11-25 DIAGNOSIS — M25562 Pain in left knee: Secondary | ICD-10-CM | POA: Diagnosis not present

## 2016-11-25 DIAGNOSIS — R2689 Other abnormalities of gait and mobility: Secondary | ICD-10-CM | POA: Diagnosis present

## 2016-11-25 DIAGNOSIS — G8929 Other chronic pain: Secondary | ICD-10-CM | POA: Diagnosis present

## 2016-11-25 DIAGNOSIS — R262 Difficulty in walking, not elsewhere classified: Secondary | ICD-10-CM | POA: Insufficient documentation

## 2016-11-25 NOTE — Therapy (Signed)
[facility] [address]  [place], [place], [address] Phone: [phone]   Fax:  [phone]  Physical Therapy Treatment  Patient Details  Name: Stacy Duncan MRN: [id number] Date of Birth: 09/03/1951 Referring Provider: Gentry Fitz, MD  Encounter Date: 11/25/2016      PT End of Session - 11/25/16 1152    Visit Number 8   Number of Visits 12   Date for PT Re-Evaluation 12/24/16   Authorization Type Medicare/Federal BCBS   Authorization - Number of Visits 75   PT Start Time 1106   PT Stop Time 1200   PT Time Calculation (min) 54 min   Activity Tolerance Patient tolerated treatment well   Behavior During Therapy [facility] for tasks assessed/performed      Past Medical History:  Diagnosis Date  . Cataract   . Hypertension     Past Surgical History:  Procedure Laterality Date  . ANKLE SURGERY    . BREAST LUMPECTOMY    . KNEE SURGERY      There were no vitals filed for this visit.      Subjective Assessment - 11/25/16 1108    Subjective Pt. reporting B knee stiffness is worse today with L>R.  Pt. noting she removed taping to R knee this morning however L knee taping still intact.   Patient Stated Goals "to get my balance back and walk better"   Currently in Pain? Yes   Pain Score 7    Pain Location Knee   Pain Orientation Left   Pain Descriptors / Indicators Sharp;Aching   Pain Type Chronic pain   Pain Radiating Towards n/a    Pain Onset More than a month ago   Pain Frequency Intermittent   Multiple Pain Sites No   Pain Score 5   Pain Location Knee   Pain Orientation Right   Pain Descriptors / Indicators Dull   Pain Type Chronic pain   Pain Onset More than a month ago   Pain Frequency Constant   Pain Relieving Factors ice, rest            [facility] Adult PT Treatment/Exercise - 11/25/16 1119      Knee/Hip Exercises: Aerobic   Nustep lvl 7 x 6'     Knee/Hip Exercises: Standing   Hip  Flexion AROM;Stengthening;Right;Left;1 set;Knee straight;15 reps   Hip Flexion Limitations holding onto counter; with yellow looped TB   Hip Abduction AROM;Right;Left;1 set;Knee straight;15 reps;Stengthening   Abduction Limitations Holding onto counter; with yellow looped TB    Hip Extension AROM;Stengthening;Right;Left;1 set;Knee straight;15 reps   Extension Limitations holding onto counter; with yellow TB    Other Standing Knee Exercises Side stepping with red TB 2 x 20 ft; monster walk with red TB 2 x 20 ft      Knee/Hip Exercises: Seated   Long Arc Quad AROM;20 reps;Right;Left   Long Arc Quad Weight 4 lbs.   Long [employer] Limitations with adduction ball squeeze; Bilaterally   Other Seated Knee/Hip Exercises Seated B fitter leg press; (1 black, 1blue) x 20 reps     Modalities   Modalities Vasopneumatic     Vasopneumatic   Number Minutes Vasopneumatic  10 minutes   Vasopnuematic Location  Knee  L    Vasopneumatic Pressure Medium   Vasopneumatic Temperature  coldest temp.       [employer]   Create Space Chrondromalacia patellar pattern to B knees - 2 strips 30% medial and lateral to  patella crossing at tibial tuberosity & distal quad + horiz strip 50% across patellar tendon           PT Short Term Goals - 11/19/16 1124      PT SHORT TERM GOAL #1   Title Independent with initial HEP by 11/19/16   Status Achieved  1.26.18: pt. excellent recall of initial HEP           PT Long Term Goals - 10/28/16 1121      PT LONG TERM GOAL #1   Title Independent with advanced HEP by 12/24/16   Status On-going     PT LONG TERM GOAL #2   Title B hip & knee strength >/= 4/5 for improved joint stability by 12/24/16   Status On-going     PT LONG TERM GOAL #3   Title B knee AROM to w/in 5 dg of PROM to promote normalized gait and stair negotiation by 12/24/16   Status On-going     PT LONG TERM GOAL #4   Title Pt able to demonstrate normal gait pattern w/o AD by 12/24/16   Status On-going      PT LONG TERM GOAL #5   Title Pt will report overall improvement in B knee pain by 50% by 12/24/16   Status On-going               Plan - 11/25/16 1153    Clinical Impression Statement Pt. noting worse B knee stiffness this morning with L > R without known trigger.  L knee taping still intact however pt. reporting she removed R knee taping this morning.  Pt. still noting benefit from taping thus R knee taping reapplied in chondromalacia patella pattern.  Hip/knee strengthening activity with only slight progression today due to increased L knee stiffness and pain.  Pt. with limited tolerance for standing activity today due to B knee stiffness.  Pt. admitting to limited adherence to HEP this past week due to increased pain and stiffness in B knees. Visible swelling noted at L knee following therex with pt. reporting this as typical response to standing activity at home.  Ice/compression applied to L knee to end treatment to promote reduction in edema and pain.  Pt. will continue to benefit from further skilled therapy to improve LE strength and activity tolerance.   PT Treatment/Interventions Patient/family education;Therapeutic exercise;Therapeutic activities;Functional mobility training;Neuromuscular re-education;Gait training;Stair training;Manual techniques;Taping;Passive range of motion;Electrical Stimulation;Cryotherapy;Vasopneumatic Device;ADLs/Self Care Home Management   PT Next Visit Plan Continue with progression of core/LE strengthening; Continue standing exercises as tolerated; modalities and manual therapy PRN for pain      Patient will benefit from skilled therapeutic intervention in order to improve the following deficits and impairments:  Pain, Decreased strength, Decreased range of motion, Impaired flexibility, Abnormal gait, Difficulty walking, Decreased activity tolerance, Decreased balance, Decreased mobility  Visit Diagnosis: Chronic pain of left knee  Chronic pain of  right knee  Muscle weakness (generalized)  Difficulty in walking, not elsewhere classified  Other abnormalities of gait and mobility     Problem List Patient Active Problem List   Diagnosis Date Noted  . Personal history of colonic adenoma 08/08/2013  . Family history of colon cancer 08/08/2013    Bess Harvest, PTA 11/25/16 12:14 PM  [facility] [address]  [place], [place], [address] Phone: [phone]   Fax:  [phone]  Name: Stacy Duncan MRN: [id number] Date of Birth: 10-Mar-1951

## 2016-11-29 ENCOUNTER — Ambulatory Visit: Payer: Medicare Other | Admitting: Physical Therapy

## 2016-11-29 DIAGNOSIS — G8929 Other chronic pain: Secondary | ICD-10-CM

## 2016-11-29 DIAGNOSIS — R2689 Other abnormalities of gait and mobility: Secondary | ICD-10-CM

## 2016-11-29 DIAGNOSIS — M25561 Pain in right knee: Secondary | ICD-10-CM

## 2016-11-29 DIAGNOSIS — M25562 Pain in left knee: Secondary | ICD-10-CM | POA: Diagnosis not present

## 2016-11-29 DIAGNOSIS — M6281 Muscle weakness (generalized): Secondary | ICD-10-CM

## 2016-11-29 DIAGNOSIS — R262 Difficulty in walking, not elsewhere classified: Secondary | ICD-10-CM

## 2016-11-29 NOTE — Therapy (Signed)
[facility] [address]  [place], [place], [address] Phone: [phone]   Fax:  [phone]  Physical Therapy Treatment  Patient Details  Name: Stacy Duncan MRN: [id number] Date of Birth: [birth year] Referring Provider: Gentry Fitz, MD  Encounter Date: 11/29/2016      PT End of Session - 11/29/16 1106    Visit Number 9   Number of Visits 12   Date for PT Re-Evaluation 12/24/16   Authorization Type Medicare/Federal BCBS   Authorization - Number of Visits 48   PT Start Time 1103   PT Stop Time 1143   PT Time Calculation (min) 40 min   Activity Tolerance Patient tolerated treatment well   Behavior During Therapy [facility] for tasks assessed/performed      Past Medical History:  Diagnosis Date  . Cataract   . Hypertension     Past Surgical History:  Procedure Laterality Date  . ANKLE SURGERY    . BREAST LUMPECTOMY    . KNEE SURGERY      There were no vitals filed for this visit.      Subjective Assessment - 11/29/16 1107    Subjective Pt reports knee pain comes and goes. Pt does report HEP seems to be giving her more mobility and that she has been able to do the exercises more consistently.    Pertinent History L knee scope for meniscal tear 2009   Limitations Standing;Walking   Patient Stated Goals "to get my balance back and walk better"   Currently in Pain? Yes   Pain Score 5    Pain Location Knee   Pain Orientation Left   Pain Descriptors / Indicators Aching  "pulling"   Pain Type Chronic pain   Pain Onset More than a month ago   Pain Frequency Constant   Aggravating Factors  Use, prolonged walking   Pain Relieving Factors ice, rest   Pain Score 4   Pain Location Knee   Pain Orientation Right   Pain Descriptors / Indicators Dull;Aching   Pain Type Chronic pain   Pain Onset More than a month ago   Pain Frequency Constant   Aggravating Factors  use,prolonged walking   Pain Relieving  Factors ice, rest                         [facility] Adult PT Treatment/Exercise - 11/29/16 0001      Knee/Hip Exercises: Aerobic   Nustep lvl 7 x 6'      Knee/Hip Exercises: Standing   Hip Flexion Both;10 reps;Knee straight   Hip Flexion Limitations yellow TB; 2 poles for B UE support   Hip ADduction Both;10 reps   Hip ADduction Limitations Yellow TB; 2 poles for B UE support   Hip Abduction Both;15 reps;Knee straight   Abduction Limitations Yellow TB; 2 poles for B UE support   Hip Extension Both;10 reps;Knee straight   Extension Limitations Yellow TB; 2 poles for B UE support   Functional Squat 10 reps;3 seconds   Functional Squat Limitations Counter for B UE support   Other Standing Knee Exercises Side Stepping with red TB; 20 ft; Both legs     Knee/Hip Exercises: Supine   Bridges Both;10 reps   Bridges Limitations legs extended on peanut ball                PT Education - 11/29/16 1158    Education provided Yes  Education Details updated HEP; Red TB for lateral band walks   Person(s) Educated Patient   Methods Explanation;Demonstration;Handout   Comprehension Verbalized understanding;Returned demonstration          PT Short Term Goals - 11/29/16 1306      PT SHORT TERM GOAL #1   Title Independent with initial HEP by 11/19/16   Status Achieved           PT Long Term Goals - 10/28/16 1121      PT LONG TERM GOAL #1   Title Independent with advanced HEP by 12/24/16   Status On-going     PT LONG TERM GOAL #2   Title B hip & knee strength >/= 4/5 for improved joint stability by 12/24/16   Status On-going     PT LONG TERM GOAL #3   Title B knee AROM to w/in 5 dg of PROM to promote normalized gait and stair negotiation by 12/24/16   Status On-going     PT LONG TERM GOAL #4   Title Pt able to demonstrate normal gait pattern w/o AD by 12/24/16   Status On-going     PT LONG TERM GOAL #5   Title Pt will report overall improvement in B knee pain  by 50% by 12/24/16   Status On-going               Plan - 11/29/16 1144    Clinical Impression Statement Pt reports she has been doing her HEP more consistently and notices improvement in knee pain and mobility when she begins performing. Pt was reminded that movement will be beneficial to help with improvement in knee pain. She was able to tolerate progressions in standing LE exercises with an update to her current HEP. Pt reports she will be gone again next week, starting 12-06-16, due to being out of town. She did not report any increased pain but did state that the exercises were challenging. Pt does continue to report benefit from B knee taping. She will continue to benefit from skilled therapy to improve LE strength & overall function.     Rehab Potential Good   Clinical Impairments Affecting Rehab Potential h/o R ankle fracture   PT Treatment/Interventions Patient/family education;Therapeutic exercise;Therapeutic activities;Functional mobility training;Neuromuscular re-education;Gait training;Stair training;Manual techniques;Taping;Passive range of motion;Electrical Stimulation;Cryotherapy;Vasopneumatic Device;ADLs/Self Care Home Management   PT Next Visit Plan needs 10th visit FOTO; update HEP with hip 4 way and monster walks if pt tolerates well; continue with LE strengthening & standing exercises as tolerate; manual therapy & modalities PRN   Consulted and Agree with Plan of Care Patient      Patient will benefit from skilled therapeutic intervention in order to improve the following deficits and impairments:  Pain, Decreased strength, Decreased range of motion, Impaired flexibility, Abnormal gait, Difficulty walking, Decreased activity tolerance, Decreased balance, Decreased mobility  Visit Diagnosis: Chronic pain of left knee  Chronic pain of right knee  Muscle weakness (generalized)  Difficulty in walking, not elsewhere classified  Other abnormalities of gait and  mobility     Problem List Patient Active Problem List   Diagnosis Date Noted  . Personal history of colonic adenoma 08/08/2013  . Family history of colon cancer 08/08/2013    Lauralee Evener, SPT 11/29/2016, 1:06 PM  [facility] [address]  [place], [place], [address] Phone: [phone]   Fax:  [phone]  Name: Stacy Duncan MRN: [id number] Date of Birth: 1951/07/24

## 2016-12-02 ENCOUNTER — Ambulatory Visit: Payer: Medicare Other | Admitting: Physical Therapy

## 2016-12-02 DIAGNOSIS — R262 Difficulty in walking, not elsewhere classified: Secondary | ICD-10-CM

## 2016-12-02 DIAGNOSIS — M25562 Pain in left knee: Principal | ICD-10-CM

## 2016-12-02 DIAGNOSIS — G8929 Other chronic pain: Secondary | ICD-10-CM

## 2016-12-02 DIAGNOSIS — M6281 Muscle weakness (generalized): Secondary | ICD-10-CM

## 2016-12-02 DIAGNOSIS — M25561 Pain in right knee: Secondary | ICD-10-CM

## 2016-12-02 DIAGNOSIS — R2689 Other abnormalities of gait and mobility: Secondary | ICD-10-CM

## 2016-12-02 NOTE — Therapy (Signed)
[facility] [address]  [place], [place], [address] Phone: [phone]   Fax:  [phone]  Physical Therapy Treatment  Patient Details  Name: Stacy Duncan MRN: [id number] Date of Birth: [birth year] Referring Provider: Gentry Fitz, MD  Encounter Date: 12/02/2016      PT End of Session - 12/02/16 1057    Visit Number 10   Number of Visits 12   Date for PT Re-Evaluation 12/24/16   Authorization Type Medicare/Federal BCBS   Authorization - Number of Visits 15   PT Start Time 1057   PT Stop Time 1200   PT Time Calculation (min) 63 min   Activity Tolerance Patient tolerated treatment well   Behavior During Therapy [facility] for tasks assessed/performed      Past Medical History:  Diagnosis Date  . Cataract   . Hypertension     Past Surgical History:  Procedure Laterality Date  . ANKLE SURGERY    . BREAST LUMPECTOMY    . KNEE SURGERY      There were no vitals filed for this visit.      Subjective Assessment - 12/02/16 1059    Subjective pt reports some soreness in the lateral muscles of leg following HEP. Pt reports she was able to do her HEP over the last two days. Pt will be out of town next week.    Diagnostic tests x-rays taken at MD office show OA (per pt)   Patient Stated Goals "to get my balance back and walk better"   Currently in Pain? Yes   Pain Score 4    Pain Location Knee   Pain Orientation Left   Pain Score --  3.5/10   Pain Location Knee   Pain Orientation Right            [facility] PT Assessment - 12/02/16 1110      AROM   Right Knee Extension 0   Right Knee Flexion 119   Left Knee Extension 12   Left Knee Flexion 112     PROM   Right Knee Extension -3   Right Knee Flexion 128   Left Knee Extension -2   Left Knee Flexion 126     Strength   Right Hip Flexion 4/5   Right Hip Extension 3+/5   Right Hip External Rotation  4/5   Right Hip Internal Rotation 4-/5   Right  Hip ABduction 4/5   Right Hip ADduction 4/5   Left Hip Flexion 4/5   Left Hip Extension 4-/5   Left Hip External Rotation 4-/5   Left Hip Internal Rotation 4-/5   Left Hip ABduction 4/5   Left Hip ADduction 4/5   Right Knee Flexion 4+/5   Right Knee Extension 4+/5   Left Knee Flexion 4/5   Left Knee Extension 4+/5  Within Available AROM                     [facility] Adult PT Treatment/Exercise - 12/02/16 1055      Knee/Hip Exercises: Aerobic   Nustep lvl 7 x 6'     Knee/Hip Exercises: Standing   Hip Flexion Both;10 reps;Knee straight   Hip Flexion Limitations Red TB; Chair for UE suport   Hip ADduction Both;10 reps   Hip ADduction Limitations Red Tb; chair for UE support   Hip Abduction Both;10 reps;Knee straight   Abduction Limitations Red TB; chair for UE support; 15 reps on L leg  Hip Extension Both;10 reps;Knee straight   Extension Limitations Red Tb; chair for UE support     Knee/Hip Exercises: Supine   Bridges with Clamshell Both;10 reps  Green TB with alt Hip ER/ABD      Kinesiotix   Create Space Chrondromalacia patellar pattern to B knees - 2 strips 30% medial and lateral to patella crossing at tibial tuberosity & distal quad + horiz strip 50% across patellar tendon                PT Education - 12/02/16 1149    Education provided Yes   Education Details Standing Hip 4 way with Red TB & bridge with alt clamshells with green TB   Person(s) Educated Patient   Methods Explanation;Demonstration;Handout   Comprehension Verbalized understanding;Returned demonstration          PT Short Term Goals - 11/29/16 1306      PT SHORT TERM GOAL #1   Title Independent with initial HEP by 11/19/16   Status Achieved           PT Long Term Goals - 12/02/16 1125      PT LONG TERM GOAL #1   Title Independent with advanced HEP by 12/24/16   Status On-going     PT LONG TERM GOAL #2   Title B hip & knee strength >/= 4/5 for improved joint stability by  12/24/16   Status On-going     PT LONG TERM GOAL #3   Title B knee AROM to w/in 5 dg of PROM to promote normalized gait and stair negotiation by 12/24/16   Status On-going     PT LONG TERM GOAL #4   Title Pt able to demonstrate normal gait pattern w/o AD by 12/24/16   Status On-going     PT LONG TERM GOAL #5   Title Pt will report overall improvement in B knee pain by 50% by 12/24/16   Status Achieved  Pt reports at least 50% improvement               Plan - 12/02/16 1156    Clinical Impression Statement Pt reports to therapy with report that her knees were sore following her HEP but she has felt good today. Today's session involved discussing goals & progress made with patient, as well as going over HEP for pt since she will be out of town next week. Pt has shown slight improvement in B knee range of motion & B hip & knee strength. She reports she only uses her cane at home after she has been resting for a while due to knee stiffness upon moving. She does note she feels she has made improvement since beginning therapy. Pt will attempt HEP throughout the next week and will return the following week to further discuss direction of plan of care. Pt will continue to benefit from skilled therapy to improve B knee ROM, B hip/knee strength, and overall function of LEs.    Rehab Potential Good   Clinical Impairments Affecting Rehab Potential h/o R ankle fracture   PT Treatment/Interventions Patient/family education;Therapeutic exercise;Therapeutic activities;Functional mobility training;Neuromuscular re-education;Gait training;Stair training;Manual techniques;Taping;Passive range of motion;Electrical Stimulation;Cryotherapy;Vasopneumatic Device;ADLs/Self Care Home Management   PT Next Visit Plan Follow up with pt about continuing with HEP independently after week off and since approaching end of POC; Continue with LE strengthening & standing exercises as tolerated; manual therapy & modalities PRN.     Consulted and Agree with Plan of Care Patient  Patient will benefit from skilled therapeutic intervention in order to improve the following deficits and impairments:  Pain, Decreased strength, Decreased range of motion, Impaired flexibility, Abnormal gait, Difficulty walking, Decreased activity tolerance, Decreased balance, Decreased mobility  Visit Diagnosis: Chronic pain of left knee  Chronic pain of right knee  Muscle weakness (generalized)  Difficulty in walking, not elsewhere classified  Other abnormalities of gait and mobility       G-Codes - 12/03/16 1206    Functional Assessment Tool Used Knee - 41% (59% limitation)   Functional Limitation Mobility: Walking and moving around   Mobility: Walking and Moving Around Current Status [phone]) At least 40 percent but less than 60 percent impaired, limited or restricted   Mobility: Walking and Moving Around Goal Status [id number]) At least 40 percent but less than 60 percent impaired, limited or restricted      Problem List Patient Active Problem List   Diagnosis Date Noted  . Personal history of colonic adenoma 08/08/2013  . Family history of colon cancer 08/08/2013    Lauralee Evener, SPT 03-Dec-2016, 1:49 PM  [facility] [address]  [place], [place], [address] Phone: [phone]   Fax:  [phone]  Percival Spanish, PT, MPT 03-Dec-2016, 1:49 PM  [facility] [address]  [place], [place], [address] Phone: [phone]   Fax:  [phone]   Name: Stacy Duncan MRN: [id number] Date of Birth: [birth year]  Percival Spanish, PT, MPT 12/03/16, 1:49 PM  [facility] [address]  [place], [place], [address] Phone: [phone]   Fax:  [phone]

## 2016-12-13 ENCOUNTER — Ambulatory Visit: Payer: Medicare Other

## 2016-12-13 DIAGNOSIS — R262 Difficulty in walking, not elsewhere classified: Secondary | ICD-10-CM

## 2016-12-13 DIAGNOSIS — R2689 Other abnormalities of gait and mobility: Secondary | ICD-10-CM

## 2016-12-13 DIAGNOSIS — M25561 Pain in right knee: Secondary | ICD-10-CM

## 2016-12-13 DIAGNOSIS — M6281 Muscle weakness (generalized): Secondary | ICD-10-CM

## 2016-12-13 DIAGNOSIS — M25562 Pain in left knee: Principal | ICD-10-CM

## 2016-12-13 DIAGNOSIS — G8929 Other chronic pain: Secondary | ICD-10-CM

## 2016-12-13 NOTE — Therapy (Addendum)
[facility] [address]  [place], [place], [address] Phone: [phone]   Fax:  [phone]  Physical Therapy Treatment  Patient Details  Name: Stacy Duncan MRN: [id number] Date of Birth: [birth year] Referring Provider: Gentry Fitz, MD  Encounter Date: 12/13/2016      PT End of Session - 12/13/16 1112    Visit Number 11   Number of Visits 12   Date for PT Re-Evaluation 12/24/16   Authorization Type Medicare/Federal BCBS   Authorization - Number of Visits 75   PT Start Time [id number]   PT Stop Time 1147  only 2 units due to MMT   PT Time Calculation (min) 45 min   Activity Tolerance Patient tolerated treatment well   Behavior During Therapy [facility] for tasks assessed/performed      Past Medical History:  Diagnosis Date  . Cataract   . Hypertension     Past Surgical History:  Procedure Laterality Date  . ANKLE SURGERY    . BREAST LUMPECTOMY    . KNEE SURGERY      There were no vitals filed for this visit.      Subjective Assessment - 12/13/16 1107    Subjective Pt. reports only performing HEP once since last treatment.     Patient Stated Goals "to get my balance back and walk better"   Currently in Pain? Yes   Pain Score 6    Pain Location Knee   Pain Orientation Left   Pain Descriptors / Indicators Dull   Pain Type Chronic pain   Pain Onset More than a month ago   Pain Frequency Constant   Aggravating Factors  HEP activities, flexing knees, stanidng up from chair   Pain Relieving Factors ice, rest   Effect of Pain on Daily Activities less able to do housework   Multiple Pain Sites Yes   Pain Score 4   Pain Location Knee   Pain Orientation Right   Pain Descriptors / Indicators Dull;Sharp   Pain Type Chronic pain   Pain Onset More than a month ago   Pain Frequency Intermittent   Aggravating Factors  use, getting up/down    Pain Relieving Factors ice, rest            [facility] PT  Assessment - 12/13/16 1119      Strength   Strength Assessment Site Hip;Knee   Right/Left Hip Right;Left   Right Hip Flexion 4/5   Right Hip Extension 4-/5   Right Hip ABduction 4/5   Right Hip ADduction 4-/5   Left Hip Flexion 4/5   Left Hip Extension 4/5   Left Hip External Rotation 4/5   Left Hip ADduction 4/5   Right/Left Knee Right;Left   Right Knee Flexion 4+/5   Right Knee Extension 4+/5   Left Knee Flexion 4/5   Left Knee Extension 4+/5            [facility] Adult PT Treatment/Exercise - 12/13/16 1155      Self-Care   Self-Care Other Self-Care Comments   Other Self-Care Comments  Pt. educated on rationale for daily HEP performance and current POC status regarding this     Knee/Hip Exercises: Aerobic   Stationary Bike Recumbent bike: level 2, 7 min      Knee/Hip Exercises: Standing   Hip Flexion Both;Knee straight;5 reps   Hip Flexion Limitations yellow TB; lower resistance/reps for HEP review  mod cues for technique   Hip ADduction Both;5  reps   Hip ADduction Limitations Yellow TB; lower resistance/reps for HEP review   mod cues for technique   Hip Abduction Both;Knee straight;5 reps   Abduction Limitations yellow TB; lower resistance/reps for HEP review  mod cues for technique   Hip Extension Both;Knee straight;5 reps   Extension Limitations Yellow TB; lower resistance/reps for HEP review  mod cues for techinqeu    Functional Squat 10 reps;3 seconds   Functional Squat Limitations Counter for B UE support  cues required for feet spacing and upright posture    Other Standing Knee Exercises Side-Stepping with red TB; 30 ft; Both legs  verbal cues required to prevent back foot drag             PT Short Term Goals - 11/29/16 1306      PT SHORT TERM GOAL #1   Title Independent with initial HEP by 11/19/16   Status Achieved           PT Long Term Goals - 12/13/16 1114      PT LONG TERM GOAL #1   Title Independent with advanced HEP by 12/24/16    Status On-going     PT LONG TERM GOAL #2   Title B hip & knee strength >/= 4/5 for improved joint stability by 12/24/16   Status On-going     PT LONG TERM GOAL #3   Title B knee AROM to w/in 5 dg of PROM to promote normalized gait and stair negotiation by 12/24/16   Status On-going     PT LONG TERM GOAL #4   Title Pt able to demonstrate normal gait pattern w/o AD by 12/24/16   Status On-going     PT LONG TERM GOAL #5   Title Pt will report overall improvement in B knee pain by 50% by 12/24/16   Status Achieved  Pt reports at least 50% improvement               Plan - 12/13/16 1132    Clinical Impression Statement Pt. returning to therapy after 11 day break reporting she has only performed HEP x 1 since last treatment.  Pt. has showed limited progress toward therapy goals and has had limited performance of HEP over duration of POC.   Consulted with PT regarding pt. status regarding pt.'s limited progression and poor compliance with HEP.  Decision made to plan for d/c at end of current POC.  Possibility of d/c discussed with pt. today with pt. confirming she feels comfortable transitioning to home program.  Today's treatment focusing on HEP review in preparation for planned d/c next visit.  Pt. requiring mod cues today to correct form with updated HEP.     PT Treatment/Interventions Patient/family education;Therapeutic exercise;Therapeutic activities;Functional mobility training;Neuromuscular re-education;Gait training;Stair training;Manual techniques;Taping;Passive range of motion;Electrical Stimulation;Cryotherapy;Vasopneumatic Device;ADLs/Self Care Home Management   PT Next Visit Plan Monitor adherence to HEP; check goals; prepare for d/c       Patient will benefit from skilled therapeutic intervention in order to improve the following deficits and impairments:  Pain, Decreased strength, Decreased range of motion, Impaired flexibility, Abnormal gait, Difficulty walking, Decreased activity  tolerance, Decreased balance, Decreased mobility  Visit Diagnosis: Chronic pain of left knee  Chronic pain of right knee  Muscle weakness (generalized)  Difficulty in walking, not elsewhere classified  Other abnormalities of gait and mobility     Problem List Patient Active Problem List   Diagnosis Date Noted  . Personal history of colonic adenoma 08/08/2013  .  Family history of colon cancer 08/08/2013    Bess Harvest, PTA 12/13/16 4:30 PM  [facility] [address]  [place], [place], [address] Phone: [phone]   Fax:  [phone]  Name: Stacy Duncan MRN: [id number] Date of Birth: 13-Oct-1951

## 2016-12-16 ENCOUNTER — Ambulatory Visit: Payer: Medicare Other | Admitting: Physical Therapy

## 2016-12-16 DIAGNOSIS — G8929 Other chronic pain: Secondary | ICD-10-CM

## 2016-12-16 DIAGNOSIS — M25562 Pain in left knee: Secondary | ICD-10-CM | POA: Diagnosis not present

## 2016-12-16 DIAGNOSIS — M25561 Pain in right knee: Secondary | ICD-10-CM

## 2016-12-16 DIAGNOSIS — M6281 Muscle weakness (generalized): Secondary | ICD-10-CM

## 2016-12-16 DIAGNOSIS — R2689 Other abnormalities of gait and mobility: Secondary | ICD-10-CM

## 2016-12-16 DIAGNOSIS — R262 Difficulty in walking, not elsewhere classified: Secondary | ICD-10-CM

## 2016-12-16 NOTE — Therapy (Signed)
[facility] [address]  [place], [place], [address] Phone: [phone]   Fax:  [phone]  Physical Therapy Treatment  Patient Details  Name: Stacy Duncan MRN: [id number] Date of Birth: [birth year] Referring Provider: Gentry Fitz, MD  Encounter Date: 12/16/2016      PT End of Session - 12/16/16 1105    Visit Number 12   Number of Visits 12   Date for PT Re-Evaluation 12/24/16   Authorization Type Medicare/Federal BCBS   Authorization - Number of Visits 88   PT Start Time 1102   PT Stop Time 1143   PT Time Calculation (min) 41 min   Activity Tolerance Patient tolerated treatment well   Behavior During Therapy [facility] for tasks assessed/performed      Past Medical History:  Diagnosis Date  . Cataract   . Hypertension     Past Surgical History:  Procedure Laterality Date  . ANKLE SURGERY    . BREAST LUMPECTOMY    . KNEE SURGERY      There were no vitals filed for this visit.      Subjective Assessment - 12/16/16 1103    Subjective Pt reports her knees are feeling tired because of doing her HEP more regularly.    Patient Stated Goals "to get my balance back and walk better"   Currently in Pain? Yes   Pain Score 6    Pain Location Knee   Pain Orientation Left   Pain Score 4   Pain Location Knee   Pain Orientation Right            [facility] PT Assessment - 12/16/16 0001      Observation/Other Assessments   Focus on Therapeutic Outcomes (FOTO)  Knee - 51% (49% limitation)      AROM   Right Knee Extension 0   Right Knee Flexion 131   Left Knee Extension 10   Left Knee Flexion 126     PROM   Right Knee Extension -3   Right Knee Flexion 135   Left Knee Extension -2   Left Knee Flexion 133     Strength   Right Hip Flexion 4/5   Right Hip Extension 4/5   Right Hip ABduction 4/5   Right Hip ADduction 4/5   Left Hip Flexion 4/5   Left Hip Extension 4/5   Left Hip ABduction 4/5   Left Hip ADduction 4/5   Right Knee Flexion 4+/5   Right Knee Extension 4+/5   Left Knee Flexion 4/5   Left Knee Extension 4+/5                     [facility] Adult PT Treatment/Exercise - 12/16/16 0001      Knee/Hip Exercises: Aerobic   Nustep lvl 7 x 6'     Knee/Hip Exercises: Standing   Hip Flexion Both;10 reps;Knee straight   Hip Flexion Limitations Red TB; B poles for UE support   Hip ADduction Both;10 reps   Hip ADduction Limitations Red TB; B poles for UE support   Hip Abduction Both;10 reps;Knee straight   Abduction Limitations Red TB; B poles for UE support   Hip Extension Both;10 reps;Knee straight   Extension Limitations Red TB; B poles for UE support   Functional Squat 10 reps;3 seconds   Functional Squat Limitations Counter for B UE support; chair placed behind pt for cue about where to direct weight  cues required for feet spacing  and upright posture     Knee/Hip Exercises: Seated   Long Arc Quad Both;10 reps   Long Arc Quad Weight 2 lbs.                PT Education - Jan 10, 2017 1201    Education provided Yes   Education Details updated final HEP   Person(s) Educated Patient   Methods Explanation;Demonstration;Handout   Comprehension Verbalized understanding;Returned demonstration          PT Short Term Goals - 11/29/16 1306      PT SHORT TERM GOAL #1   Title Independent with initial HEP by 11/19/16   Status Achieved           PT Long Term Goals - 01/10/2017 1107      PT LONG TERM GOAL #1   Title Independent with advanced HEP by 12/24/16   Status Achieved     PT LONG TERM GOAL #2   Title B hip & knee strength >/= 4/5 for improved joint stability by 12/24/16   Status Achieved     PT LONG TERM GOAL #3   Title B knee AROM to w/in 5 dg of PROM to promote normalized gait and stair negotiation by 12/24/16   Status Partially Met  L Knee AROM: 10-126; L Knee PROM: -2-133     PT LONG TERM GOAL #4   Title Pt able to demonstrate normal gait  pattern w/o AD by 12/24/16   Status Achieved     PT LONG TERM GOAL #5   Title Pt will report overall improvement in B knee pain by 50% by 12/24/16   Status Achieved               Plan - 01/10/2017 1106    Clinical Impression Statement Pt is reporting that she was more compliant with her HEP since she was last seen and is feeling some soreness in her knees. Today's session focused on assessing goals for planned D/C & reviewing any exercises from her HEP that she has questions or concerns about. Pt has improved B AROM & PROM of her knees. She has increased her B hip & knee strength. She reports she is only using her cane when rising from her chair at home but does not need it when she goes out into the community. She is reporting that she does see the benefits of remaining active & getting regular exercise to help reduce her B knee pain. She was able to have correct form with her exercises today & reported no problems with them. Pt feels she is ready for discharge and has everything she needs to be successful at home. Pt will be discharged following today's visit.    Rehab Potential Good   PT Treatment/Interventions Patient/family education;Therapeutic exercise;Therapeutic activities;Functional mobility training;Neuromuscular re-education;Gait training;Stair training;Manual techniques;Taping;Passive range of motion;Electrical Stimulation;Cryotherapy;Vasopneumatic Device;ADLs/Self Care Home Management   PT Next Visit Plan Discharge   Consulted and Agree with Plan of Care Patient      Patient will benefit from skilled therapeutic intervention in order to improve the following deficits and impairments:  Pain, Decreased strength, Decreased range of motion, Impaired flexibility, Abnormal gait, Difficulty walking, Decreased activity tolerance, Decreased balance, Decreased mobility  Visit Diagnosis: Chronic pain of left knee  Chronic pain of right knee  Muscle weakness (generalized)  Difficulty in  walking, not elsewhere classified  Other abnormalities of gait and mobility       G-Codes - 2017-01-10 1202    Functional Assessment Tool Used (  Outpatient Only) Knee- 51% (49% limitation)   Functional Limitation Mobility: Walking and moving around   Mobility: Walking and Moving Around Goal Status [phone]) At least 40 percent but less than 60 percent impaired, limited or restricted   Mobility: Walking and Moving Around Discharge Status [phone]) At least 40 percent but less than 60 percent impaired, limited or restricted      Problem List Patient Active Problem List   Diagnosis Date Noted  . Personal history of colonic adenoma 08/08/2013  . Family history of colon cancer 08/08/2013     PHYSICAL THERAPY DISCHARGE SUMMARY  Visits from Start of Care: 12  Current functional level related to goals / functional outcomes: Refer to clinical impression for current functional level. She met all goals except for her L knee ROM.   Remaining deficits: As above.    Education / Equipment: HEP Plan: Patient agrees to discharge.  Patient goals were partially met. Patient is being discharged due to being pleased with the current functional level.  ?????         Lauralee Evener, SPT 12/16/2016, 1:53 PM  [facility] [address]  [place], [place], [address] Phone: [phone]   Fax:  [phone]   Percival Spanish, PT, MPT 12/16/16, 1:56 PM  [facility] [address]  [place], [place], [address] Phone: [phone]   Fax:  [phone]    Name: Stacy Duncan MRN: [id number] Date of Birth: Dec 30, 1950

## 2018-09-19 ENCOUNTER — Other Ambulatory Visit: Payer: Self-pay | Admitting: Family Medicine

## 2018-09-19 DIAGNOSIS — M25561 Pain in right knee: Secondary | ICD-10-CM

## 2018-10-08 ENCOUNTER — Ambulatory Visit
Admission: RE | Admit: 2018-10-08 | Discharge: 2018-10-08 | Disposition: A | Discharge status: Home / Self Care | Payer: Federal, State, Local not specified - PPO | Source: Ambulatory Visit | Attending: Family Medicine | Admitting: Family Medicine

## 2018-10-08 DIAGNOSIS — M25561 Pain in right knee: Secondary | ICD-10-CM

## 2018-11-02 ENCOUNTER — Other Ambulatory Visit: Payer: Self-pay | Admitting: Orthopaedic Surgery

## 2018-11-09 ENCOUNTER — Encounter (HOSPITAL_COMMUNITY): Payer: Self-pay

## 2018-11-09 NOTE — Pre-Procedure Instructions (Signed)
Falisa Dezeeuw  11/09/2018      [id number] - [place], [place] [address] [place] [address] Phone: [phone] Fax: [phone]    Your procedure is scheduled on Tuesday January 28th.  Report to [facility] Admitting at 1030 A.M.  Call this number if you have problems the morning of surgery:  [phone]   Remember:  Do not eat or drink after midnight.    Take these medicines the morning of surgery with A SIP OF WATER  acetaminophen (TYLENOL)  If needed mometasone (NASONEX)   rosuvastatin (CRESTOR)   Follow your surgeon's instructions on when to stop Asprin.  If no instructions were given by your surgeon then you will need to call the office to get those instructions.    7 days prior to surgery STOP taking any Aspirin (unless otherwise instructed by your surgeon), Aleve, Naproxen, Ibuprofen, Motrin, Advil, Goody's, BC's, all herbal medications, fish oil, and all vitamins.    Do not wear jewelry, make-up or nail polish.  Do not wear lotions, powders, or perfumes, or deodorant.  Do not shave 48 hours prior to surgery.  Men may shave face and neck.  Do not bring valuables to the hospital.  [facility] is not responsible for any belongings or valuables.  Contacts, dentures or bridgework may not be worn into surgery.  Leave your suitcase in the car.  After surgery it may be brought to your room.  For patients admitted to the hospital, discharge time will be determined by your treatment team.  Patients discharged the day of surgery will not be allowed to drive home.    [place]- Preparing For Surgery  Before surgery, you can play an important role. Because skin is not sterile, your skin needs to be as free of germs as possible. You can reduce the number of germs on your skin by washing with CHG (chlorahexidine gluconate) Soap before surgery.  CHG is an antiseptic cleaner which kills germs and bonds  with the skin to continue killing germs even after washing.    Oral Hygiene is also important to reduce your risk of infection.  Remember - BRUSH YOUR TEETH THE MORNING OF SURGERY WITH YOUR REGULAR TOOTHPASTE  Please do not use if you have an allergy to CHG or antibacterial soaps. If your skin becomes reddened/irritated stop using the CHG.  Do not shave (including legs and underarms) for at least 48 hours prior to first CHG shower. It is OK to shave your face.  Please follow these instructions carefully.   1. Shower the NIGHT BEFORE SURGERY and the MORNING OF SURGERY with CHG.   2. If you chose to wash your hair, wash your hair first as usual with your normal shampoo.  3. After you shampoo, rinse your hair and body thoroughly to remove the shampoo.  4. Use CHG as you would any other liquid soap. You can apply CHG directly to the skin and wash gently with a scrungie or a clean washcloth.   5. Apply the CHG Soap to your body ONLY FROM THE NECK DOWN.  Do not use on open wounds or open sores. Avoid contact with your eyes, ears, mouth and genitals (private parts). Wash Face and genitals (private parts)  with your normal soap.  6. Wash thoroughly, paying special attention to the area where your surgery will be performed.  7. Thoroughly rinse your body with warm water from  the neck down.  8. DO NOT shower/wash with your normal soap after using and rinsing off the CHG Soap.  9. Pat yourself dry with a CLEAN TOWEL.  10. Wear CLEAN PAJAMAS to bed the night before surgery, wear comfortable clothes the morning of surgery  11. Place CLEAN SHEETS on your bed the night of your first shower and DO NOT SLEEP WITH PETS.    Day of Surgery:  Do not apply any deodorants/lotions.  Please wear clean clothes to the hospital/surgery center.   Remember to brush your teeth WITH YOUR REGULAR TOOTHPASTE.    Please read over the following fact sheets that you were given.

## 2018-11-10 ENCOUNTER — Other Ambulatory Visit: Payer: Self-pay

## 2018-11-10 ENCOUNTER — Encounter (HOSPITAL_COMMUNITY): Payer: Self-pay

## 2018-11-10 ENCOUNTER — Encounter (HOSPITAL_COMMUNITY)
Admission: RE | Admit: 2018-11-10 | Discharge: 2018-11-10 | Disposition: A | Discharge status: Home / Self Care | Payer: Medicare Other | Source: Ambulatory Visit | Attending: Orthopaedic Surgery | Admitting: Orthopaedic Surgery

## 2018-11-10 ENCOUNTER — Ambulatory Visit (HOSPITAL_COMMUNITY)
Admission: RE | Admit: 2018-11-10 | Discharge: 2018-11-10 | Disposition: A | Discharge status: Home / Self Care | Payer: Medicare Other | Source: Ambulatory Visit | Attending: Orthopaedic Surgery | Admitting: Orthopaedic Surgery

## 2018-11-10 DIAGNOSIS — Z01818 Encounter for other preprocedural examination: Secondary | ICD-10-CM | POA: Diagnosis not present

## 2018-11-10 HISTORY — DX: Depression, unspecified: F32.A

## 2018-11-10 HISTORY — DX: Pure hypercholesterolemia, unspecified: E78.00

## 2018-11-10 HISTORY — DX: Major depressive disorder, single episode, unspecified: F32.9

## 2018-11-10 LAB — BASIC METABOLIC PANEL
Anion gap: 13 (ref 5–15)
BUN: 17 mg/dL (ref 8–23)
CO2: 25 mmol/L (ref 22–32)
Calcium: 9.7 mg/dL (ref 8.9–10.3)
Chloride: 103 mmol/L (ref 98–111)
Creatinine, Ser: 0.83 mg/dL (ref 0.44–1.00)
GFR calc Af Amer: >60 mL/min (ref >60)
GFR calc non Af Amer: >60 mL/min (ref >60)
Glucose, Bld: 85 mg/dL (ref 70–99)
Potassium: 3.5 mmol/L (ref 3.5–5.1)
Sodium: 141 mmol/L (ref 135–145)

## 2018-11-10 LAB — URINALYSIS, ROUTINE W REFLEX MICROSCOPIC
Bilirubin Urine: NEGATIVE
Glucose, UA: NEGATIVE mg/dL
Hgb urine dipstick: NEGATIVE
KETONES UR: NEGATIVE mg/dL
Leukocytes, UA: NEGATIVE
Nitrite: NEGATIVE
Protein, ur: NEGATIVE mg/dL
Specific Gravity, Urine: 1.005 (ref 1.005–1.030)
pH: 7 (ref 5.0–8.0)

## 2018-11-10 LAB — CBC WITH DIFFERENTIAL/PLATELET
Abs Immature Granulocytes: 0.02 10*3/uL (ref 0.00–0.07)
Basophils Absolute: 0.1 10*3/uL (ref 0.0–0.1)
Basophils Relative: 1 %
EOS PCT: 4 %
Eosinophils Absolute: 0.3 10*3/uL (ref 0.0–0.5)
HCT: 47.9 % — ABNORMAL HIGH (ref 36.0–46.0)
Hemoglobin: 15.8 g/dL — ABNORMAL HIGH (ref 12.0–15.0)
Immature Granulocytes: 0 %
Lymphocytes Relative: 35 %
Lymphs Abs: 3.2 10*3/uL (ref 0.7–4.0)
MCH: 28.8 pg (ref 26.0–34.0)
MCHC: 33 g/dL (ref 30.0–36.0)
MCV: 87.4 fL (ref 80.0–100.0)
MONO ABS: 0.5 10*3/uL (ref 0.1–1.0)
Monocytes Relative: 6 %
Neutro Abs: 4.9 10*3/uL (ref 1.7–7.7)
Neutrophils Relative %: 54 %
Platelets: 314 10*3/uL (ref 150–400)
RBC: 5.48 MIL/uL — ABNORMAL HIGH (ref 3.87–5.11)
RDW: 14.3 % (ref 11.5–15.5)
WBC: 8.9 10*3/uL (ref 4.0–10.5)
nRBC: 0 % (ref 0.0–0.2)

## 2018-11-10 LAB — TYPE AND SCREEN
ABO/RH(D): A POS
Antibody Screen: NEGATIVE

## 2018-11-10 LAB — APTT: aPTT: 29 seconds (ref 24–36)

## 2018-11-10 LAB — PROTIME-INR
INR: 1
Prothrombin Time: 13.1 seconds (ref 11.4–15.2)

## 2018-11-10 LAB — SURGICAL PCR SCREEN
MRSA, PCR: NEGATIVE
Staphylococcus aureus: NEGATIVE

## 2018-11-10 LAB — ABO/RH: ABO/RH(D): A POS

## 2018-11-10 NOTE — Progress Notes (Signed)
PCP - Dr. Ethelene Hal  Chest x-ray - 11/10/18 EKG - 11/10/18  Blood Thinner Instructions: N/A Aspirin Instructions: pt will call Dr. Jerald Kief office   Anesthesia review: EKG review  Patient denies shortness of breath, fever, cough and chest pain at PAT appointment   Patient verbalized understanding of instructions that were given to them at the PAT appointment. Patient was also instructed that they will need to review over the PAT instructions again at home before surgery.

## 2018-11-13 NOTE — Anesthesia Preprocedure Evaluation (Addendum)
Anesthesia Evaluation  Patient identified by MRN, date of birth, ID band Patient awake    Reviewed: Allergy & Precautions, NPO status , Patient's Chart, lab work & pertinent test results  Airway Mallampati: II  TM Distance: >3 FB Neck ROM: Full    Dental no notable dental hx.    Pulmonary neg pulmonary ROS, former smoker,    Pulmonary exam normal breath sounds clear to auscultation       Cardiovascular hypertension, Pt. on medications negative cardio ROS Normal cardiovascular exam Rhythm:Regular Rate:Normal     Neuro/Psych Depression negative neurological ROS  negative psych ROS   GI/Hepatic negative GI ROS, Neg liver ROS,   Endo/Other  negative endocrine ROS  Renal/GU negative Renal ROS  negative genitourinary   Musculoskeletal negative musculoskeletal ROS (+)   Abdominal   Peds negative pediatric ROS (+)  Hematology negative hematology ROS (+)   Anesthesia Other Findings   Reproductive/Obstetrics negative OB ROS                            Anesthesia Physical Anesthesia Plan  ASA: II  Anesthesia Plan: Spinal   Post-op Pain Management:  Regional for Post-op pain   Induction: Intravenous  PONV Risk Score and Plan: 2 and Ondansetron and Midazolam  Airway Management Planned: Simple Face Mask  Additional Equipment:   Intra-op Plan:   Post-operative Plan:   Informed Consent: I have reviewed the patients History and Physical, chart, labs and discussed the procedure including the risks, benefits and alternatives for the proposed anesthesia with the patient or authorized representative who has indicated his/her understanding and acceptance.     Dental advisory given  Plan Discussed with: CRNA  Anesthesia Plan Comments: (Comparison EKG in [facility] from 04/02/2009, minimal interval change. )       Anesthesia Quick Evaluation

## 2018-11-16 ENCOUNTER — Encounter: Payer: Self-pay | Admitting: Internal Medicine

## 2018-11-16 NOTE — H&P (Signed)
TOTAL KNEE ADMISSION H&P  Patient is being admitted for right total knee arthroplasty.  Subjective:  Chief Complaint:right knee pain.  HPI: Stacy Duncan, 68 y.o. female, has a history of pain and functional disability in the right knee due to arthritis and has failed non-surgical conservative treatments for greater than 12 weeks to includeNSAID's and/or analgesics, corticosteriod injections, viscosupplementation injections, flexibility and strengthening excercises, use of assistive devices, weight reduction as appropriate and activity modification.  Onset of symptoms was gradual, starting 5 years ago with gradually worsening course since that time. The patient noted no past surgery on the right knee(s).  Patient currently rates pain in the right knee(s) at 10 out of 10 with activity. Patient has night pain, worsening of pain with activity and weight bearing, pain that interferes with activities of daily living, crepitus and joint swelling.  Patient has evidence of subchondral cysts, subchondral sclerosis, periarticular osteophytes and joint space narrowing by imaging studies. There is no active infection.  Patient Active Problem List   Diagnosis Date Noted  . Personal history of colonic adenoma 08/08/2013  . Family history of colon cancer 08/08/2013   Past Medical History:  Diagnosis Date  . Cataract   . Depression   . Hypercholesteremia   . Hypertension     Past Surgical History:  Procedure Laterality Date  . ANKLE SURGERY Right   . BREAST LUMPECTOMY Left    mid 3s  . KNEE SURGERY Left     No current facility-administered medications for this encounter.    Current Outpatient Medications  Medication Sig Dispense Refill Last Dose  . acetaminophen (TYLENOL) 500 MG tablet Take 500 mg by mouth every 6 (six) hours as needed for moderate pain.     Marland Kitchen aspirin EC 325 MG tablet Take 325 mg by mouth every morning.     . chlorthalidone (HYGROTON) 25 MG tablet Take 25 mg by mouth every morning.      . escitalopram (LEXAPRO) 10 MG tablet Take 10 mg by mouth every morning.     . meloxicam (MOBIC) 15 MG tablet Take 15 mg by mouth at bedtime as needed for pain.     . mometasone (NASONEX) 50 MCG/ACT nasal spray Place 1 spray into the nose daily.      . Multiple Vitamin (MULTIVITAMIN WITH MINERALS) TABS tablet Take 1 tablet by mouth daily.     . rosuvastatin (CRESTOR) 20 MG tablet Take 20 mg by mouth every morning.      . Vitamin D, Ergocalciferol, (DRISDOL) 1.25 MG (50000 UT) CAPS capsule Take 50,000 Units by mouth every 7 (seven) days.     Marland Kitchen ibuprofen (ADVIL,MOTRIN) 800 MG tablet Take 1 tablet (800 mg total) by mouth 3 (three) times daily. (Patient not taking: Reported on 11/07/2018) 21 tablet 0 Not Taking at Unknown time   No Known Allergies  Social History   Tobacco Use  . Smoking status: Former Smoker    Last attempt to quit: 10/25/2006    Years since quitting: 12.0  . Smokeless tobacco: Never Used  Substance Use Topics  . Alcohol use: Yes    Alcohol/week: 2.0 standard drinks    Types: 2 Glasses of wine per week    Comment: per week    Family History  Problem Relation Age of Onset  . Colon cancer Mother 61     Review of Systems  Musculoskeletal: Positive for joint pain.       Right knee  All other systems reviewed and are negative.  Objective:  Physical Exam  Constitutional: She is oriented to person, place, and time. She appears well-developed and well-nourished.  HENT:  Head: Normocephalic and atraumatic.  Eyes: Pupils are equal, round, and reactive to light.  Neck: Normal range of motion.  Cardiovascular: Normal rate and regular rhythm.  Respiratory: Effort normal.  GI: Soft.  Musculoskeletal:     Comments: Right knee motion is from 5-120.  She has crepitation and medial joint line pain.  Ligaments are stable.  Her skin is benign.  Sensation and motor function are intact distally with palpable pulses in her feet.  She has good hip motion which is painless.     Neurological: She is alert and oriented to person, place, and time.  Skin: Skin is warm and dry.  Psychiatric: She has a normal mood and affect. Her behavior is normal. Judgment and thought content normal.    Vital signs in last 24 hours:    Labs:   Estimated body mass index is 33.8 kg/m as calculated from the following:   Height as of 11/10/18: 5\' 5"  (1.651 m).   Weight as of 11/10/18: 92.1 kg.   Imaging Review Plain radiographs demonstrate severe degenerative joint disease of the right knee(s). The overall alignment isneutral. The bone quality appears to be good for age and reported activity level.   Preoperative templating of the joint replacement has been completed, documented, and submitted to the Operating Room personnel in order to optimize intra-operative equipment management.    Patient's anticipated LOS is less than 2 midnights, meeting these requirements: - Younger than 70 - Lives within 1 hour of care - Has a competent adult at home to recover with post-op recover - NO history of  - Chronic pain requiring opiods  - Diabetes  - Coronary Artery Disease  - Heart failure  - Heart attack  - Stroke  - DVT/VTE  - Cardiac arrhythmia  - Respiratory Failure/COPD  - Renal failure  - Anemia  - Advanced Liver disease        Assessment/Plan:  End stage primary arthritis, right knee   The patient history, physical examination, clinical judgment of the provider and imaging studies are consistent with end stage degenerative joint disease of the right knee(s) and total knee arthroplasty is deemed medically necessary. The treatment options including medical management, injection therapy arthroscopy and arthroplasty were discussed at length. The risks and benefits of total knee arthroplasty were presented and reviewed. The risks due to aseptic loosening, infection, stiffness, patella tracking problems, thromboembolic complications and other imponderables were discussed.  The patient acknowledged the explanation, agreed to proceed with the plan and consent was signed. Patient is being admitted for inpatient treatment for surgery, pain control, PT, OT, prophylactic antibiotics, VTE prophylaxis, progressive ambulation and ADL's and discharge planning. The patient is planning to be discharged home with home health services

## 2018-11-17 ENCOUNTER — Other Ambulatory Visit: Payer: Self-pay | Admitting: Orthopaedic Surgery

## 2018-11-17 NOTE — Care Plan (Signed)
Spoke with patient prior to surgery. She plans to discharge to home with roommate and HHPT. I have ordered her a rolling walker from [facility]. Choice offered.   Stacy Duncan, [place]

## 2018-11-20 MED ORDER — TRANEXAMIC ACID 1000 MG/10ML IV SOLN
2000.0000 mg | INTRAVENOUS | Status: AC
  Administered 2018-11-21: 2000 mg via TOPICAL
  Filled 2018-11-20: qty 20

## 2018-11-20 MED ORDER — BUPIVACAINE LIPOSOME 1.3 % IJ SUSP
20.0000 mL | INTRAMUSCULAR | Status: AC
  Administered 2018-11-21: 20 mL
  Filled 2018-11-20: qty 20

## 2018-11-21 ENCOUNTER — Ambulatory Visit (HOSPITAL_COMMUNITY): Payer: Medicare Other | Admitting: Physician Assistant

## 2018-11-21 ENCOUNTER — Encounter (HOSPITAL_COMMUNITY)
Admission: RE | Disposition: A | Discharge status: Home Health | Payer: Self-pay | Source: Home / Self Care | Attending: Orthopaedic Surgery

## 2018-11-21 ENCOUNTER — Inpatient Hospital Stay (HOSPITAL_COMMUNITY)
Admission: RE | Admit: 2018-11-21 | Discharge: 2018-11-23 | DRG: 470 | Disposition: A | Discharge status: Home Health | Payer: Medicare Other | Attending: Orthopaedic Surgery | Admitting: Orthopaedic Surgery

## 2018-11-21 ENCOUNTER — Ambulatory Visit (HOSPITAL_COMMUNITY): Payer: Medicare Other | Admitting: Anesthesiology

## 2018-11-21 ENCOUNTER — Other Ambulatory Visit: Payer: Self-pay

## 2018-11-21 ENCOUNTER — Encounter (HOSPITAL_COMMUNITY): Payer: Self-pay

## 2018-11-21 DIAGNOSIS — Z6833 Body mass index (BMI) 33.0-33.9, adult: Secondary | ICD-10-CM

## 2018-11-21 DIAGNOSIS — Z87891 Personal history of nicotine dependence: Secondary | ICD-10-CM

## 2018-11-21 DIAGNOSIS — E7800 Pure hypercholesterolemia, unspecified: Secondary | ICD-10-CM | POA: Diagnosis present

## 2018-11-21 DIAGNOSIS — F329 Major depressive disorder, single episode, unspecified: Secondary | ICD-10-CM | POA: Diagnosis present

## 2018-11-21 DIAGNOSIS — E669 Obesity, unspecified: Secondary | ICD-10-CM | POA: Diagnosis present

## 2018-11-21 DIAGNOSIS — M1711 Unilateral primary osteoarthritis, right knee: Principal | ICD-10-CM | POA: Diagnosis present

## 2018-11-21 DIAGNOSIS — Z79899 Other long term (current) drug therapy: Secondary | ICD-10-CM

## 2018-11-21 DIAGNOSIS — Z7982 Long term (current) use of aspirin: Secondary | ICD-10-CM

## 2018-11-21 DIAGNOSIS — I10 Essential (primary) hypertension: Secondary | ICD-10-CM | POA: Diagnosis present

## 2018-11-21 DIAGNOSIS — Z791 Long term (current) use of non-steroidal anti-inflammatories (NSAID): Secondary | ICD-10-CM

## 2018-11-21 HISTORY — PX: TOTAL KNEE ARTHROPLASTY: SHX125

## 2018-11-21 SURGERY — ARTHROPLASTY, KNEE, TOTAL
Anesthesia: Spinal | Site: Knee | Laterality: Right

## 2018-11-21 MED ORDER — ACETAMINOPHEN 325 MG PO TABS: 325.0000 mg | ORAL_TABLET | Freq: Four times a day (QID) | ORAL | Status: DC | PRN

## 2018-11-21 MED ORDER — PROPOFOL 500 MG/50ML IV EMUL
INTRAVENOUS | Status: DC | PRN
  Administered 2018-11-21: 100 ug/kg/min via INTRAVENOUS

## 2018-11-21 MED ORDER — ALUM & MAG HYDROXIDE-SIMETH 200-200-20 MG/5ML PO SUSP: 30.0000 mL | ORAL | Status: DC | PRN

## 2018-11-21 MED ORDER — ROPIVACAINE HCL 5 MG/ML IJ SOLN
INTRAMUSCULAR | Status: DC | PRN
  Administered 2018-11-21: 20 mL via PERINEURAL

## 2018-11-21 MED ORDER — HYDROCODONE-ACETAMINOPHEN 5-325 MG PO TABS
ORAL_TABLET | ORAL | Status: AC
  Administered 2018-11-21: 2 via ORAL
  Filled 2018-11-21: qty 2

## 2018-11-21 MED ORDER — METOCLOPRAMIDE HCL 5 MG PO TABS: 5.0000 mg | ORAL_TABLET | Freq: Three times a day (TID) | ORAL | Status: DC | PRN

## 2018-11-21 MED ORDER — SODIUM CHLORIDE 0.9 % IR SOLN
Status: DC | PRN
  Administered 2018-11-21: 3000 mL

## 2018-11-21 MED ORDER — ONDANSETRON HCL 4 MG/2ML IJ SOLN
INTRAMUSCULAR | Status: DC | PRN
  Administered 2018-11-21: 4 mg via INTRAVENOUS

## 2018-11-21 MED ORDER — PROMETHAZINE HCL 25 MG/ML IJ SOLN
INTRAMUSCULAR | Status: AC
  Filled 2018-11-21: qty 1

## 2018-11-21 MED ORDER — FENTANYL CITRATE (PF) 100 MCG/2ML IJ SOLN
100.0000 ug | Freq: Once | INTRAMUSCULAR | Status: AC
  Administered 2018-11-21: 100 ug via INTRAVENOUS

## 2018-11-21 MED ORDER — DOCUSATE SODIUM 100 MG PO CAPS
100.0000 mg | ORAL_CAPSULE | Freq: Two times a day (BID) | ORAL | Status: DC
  Administered 2018-11-22 – 2018-11-23 (×3): 100 mg via ORAL
  Filled 2018-11-21 (×3): qty 1

## 2018-11-21 MED ORDER — MIDAZOLAM HCL 2 MG/2ML IJ SOLN
2.0000 mg | Freq: Once | INTRAMUSCULAR | Status: AC
  Administered 2018-11-21: 2 mg via INTRAVENOUS

## 2018-11-21 MED ORDER — MORPHINE SULFATE (PF) 2 MG/ML IV SOLN: 0.5000 mg | INTRAVENOUS | Status: DC | PRN

## 2018-11-21 MED ORDER — HYDROCODONE-ACETAMINOPHEN 7.5-325 MG PO TABS
1.0000 | ORAL_TABLET | ORAL | Status: DC | PRN
  Administered 2018-11-22 (×2): 1 via ORAL
  Filled 2018-11-21 (×2): qty 1

## 2018-11-21 MED ORDER — ONDANSETRON HCL 4 MG/2ML IJ SOLN
INTRAMUSCULAR | Status: AC
  Filled 2018-11-21: qty 2

## 2018-11-21 MED ORDER — SODIUM CHLORIDE 0.9 % IV SOLN
INTRAVENOUS | Status: DC | PRN
  Administered 2018-11-21: 40 ug/min via INTRAVENOUS

## 2018-11-21 MED ORDER — OXYCODONE HCL 5 MG PO TABS: 5.0000 mg | ORAL_TABLET | Freq: Once | ORAL | Status: DC | PRN

## 2018-11-21 MED ORDER — CEFAZOLIN SODIUM-DEXTROSE 2-4 GM/100ML-% IV SOLN
2.0000 g | INTRAVENOUS | Status: AC
  Administered 2018-11-21: 2 g via INTRAVENOUS
  Filled 2018-11-21: qty 100

## 2018-11-21 MED ORDER — KETOROLAC TROMETHAMINE 15 MG/ML IJ SOLN
7.5000 mg | Freq: Four times a day (QID) | INTRAMUSCULAR | Status: AC
  Administered 2018-11-21 – 2018-11-22 (×4): 7.5 mg via INTRAVENOUS
  Filled 2018-11-21 (×4): qty 1

## 2018-11-21 MED ORDER — SUCCINYLCHOLINE CHLORIDE 20 MG/ML IJ SOLN
INTRAMUSCULAR | Status: DC | PRN
  Administered 2018-11-21: 120 mg via INTRAVENOUS

## 2018-11-21 MED ORDER — MIDAZOLAM HCL 2 MG/2ML IJ SOLN
INTRAMUSCULAR | Status: AC
  Administered 2018-11-21: 2 mg via INTRAVENOUS
  Filled 2018-11-21: qty 2

## 2018-11-21 MED ORDER — CEFAZOLIN SODIUM-DEXTROSE 2-4 GM/100ML-% IV SOLN
2.0000 g | Freq: Four times a day (QID) | INTRAVENOUS | Status: AC
  Administered 2018-11-21 – 2018-11-22 (×2): 2 g via INTRAVENOUS
  Filled 2018-11-21 (×2): qty 100

## 2018-11-21 MED ORDER — CHLORTHALIDONE 25 MG PO TABS
25.0000 mg | ORAL_TABLET | Freq: Every morning | ORAL | Status: DC
  Administered 2018-11-22 – 2018-11-23 (×2): 25 mg via ORAL
  Filled 2018-11-21 (×2): qty 1

## 2018-11-21 MED ORDER — PHENOL 1.4 % MT LIQD: 1.0000 | OROMUCOSAL | Status: DC | PRN

## 2018-11-21 MED ORDER — DIPHENHYDRAMINE HCL 12.5 MG/5ML PO ELIX: 12.5000 mg | ORAL_SOLUTION | ORAL | Status: DC | PRN

## 2018-11-21 MED ORDER — ESMOLOL HCL 100 MG/10ML IV SOLN
INTRAVENOUS | Status: AC
  Filled 2018-11-21: qty 10

## 2018-11-21 MED ORDER — SODIUM CHLORIDE 0.9% FLUSH
INTRAVENOUS | Status: DC | PRN
  Administered 2018-11-21: 30 mL

## 2018-11-21 MED ORDER — HYDROCODONE-ACETAMINOPHEN 5-325 MG PO TABS
1.0000 | ORAL_TABLET | ORAL | Status: DC | PRN
  Administered 2018-11-21 – 2018-11-23 (×5): 2 via ORAL
  Filled 2018-11-21 (×4): qty 2

## 2018-11-21 MED ORDER — PHENYLEPHRINE 40 MCG/ML (10ML) SYRINGE FOR IV PUSH (FOR BLOOD PRESSURE SUPPORT)
PREFILLED_SYRINGE | INTRAVENOUS | Status: DC | PRN
  Administered 2018-11-21: 200 ug via INTRAVENOUS
  Administered 2018-11-21: 120 ug via INTRAVENOUS

## 2018-11-21 MED ORDER — OXYCODONE HCL 5 MG/5ML PO SOLN: 5.0000 mg | Freq: Once | ORAL | Status: DC | PRN

## 2018-11-21 MED ORDER — ACETAMINOPHEN 500 MG PO TABS
500.0000 mg | ORAL_TABLET | Freq: Four times a day (QID) | ORAL | Status: AC
  Administered 2018-11-22 (×3): 500 mg via ORAL
  Filled 2018-11-21 (×3): qty 1

## 2018-11-21 MED ORDER — PROMETHAZINE HCL 25 MG/ML IJ SOLN: 6.2500 mg | INTRAMUSCULAR | Status: DC | PRN

## 2018-11-21 MED ORDER — FENTANYL CITRATE (PF) 250 MCG/5ML IJ SOLN
INTRAMUSCULAR | Status: AC
  Filled 2018-11-21: qty 5

## 2018-11-21 MED ORDER — CHLORHEXIDINE GLUCONATE 4 % EX LIQD: 60.0000 mL | Freq: Once | CUTANEOUS | Status: DC

## 2018-11-21 MED ORDER — FENTANYL CITRATE (PF) 100 MCG/2ML IJ SOLN
INTRAMUSCULAR | Status: AC
  Administered 2018-11-21: 100 ug via INTRAVENOUS
  Filled 2018-11-21: qty 2

## 2018-11-21 MED ORDER — TRANEXAMIC ACID-NACL 1000-0.7 MG/100ML-% IV SOLN
1000.0000 mg | INTRAVENOUS | Status: AC
  Administered 2018-11-21: 1000 mg via INTRAVENOUS
  Filled 2018-11-21: qty 100

## 2018-11-21 MED ORDER — METHOCARBAMOL 1000 MG/10ML IJ SOLN
500.0000 mg | Freq: Four times a day (QID) | INTRAVENOUS | Status: DC | PRN
  Filled 2018-11-21: qty 5

## 2018-11-21 MED ORDER — ASPIRIN EC 325 MG PO TBEC
325.0000 mg | DELAYED_RELEASE_TABLET | Freq: Two times a day (BID) | ORAL | Status: DC
  Administered 2018-11-22 – 2018-11-23 (×3): 325 mg via ORAL
  Filled 2018-11-21 (×3): qty 1

## 2018-11-21 MED ORDER — METHOCARBAMOL 500 MG PO TABS
500.0000 mg | ORAL_TABLET | Freq: Four times a day (QID) | ORAL | Status: DC | PRN
  Administered 2018-11-22 – 2018-11-23 (×2): 500 mg via ORAL
  Filled 2018-11-21 (×2): qty 1

## 2018-11-21 MED ORDER — BUPIVACAINE-EPINEPHRINE (PF) 0.25% -1:200000 IJ SOLN
INTRAMUSCULAR | Status: DC | PRN
  Administered 2018-11-21: 30 mL

## 2018-11-21 MED ORDER — BUPIVACAINE HCL (PF) 0.75 % IJ SOLN
INTRAMUSCULAR | Status: DC | PRN
  Administered 2018-11-21: 1.8 mL via INTRATHECAL

## 2018-11-21 MED ORDER — TRANEXAMIC ACID-NACL 1000-0.7 MG/100ML-% IV SOLN
1000.0000 mg | Freq: Once | INTRAVENOUS | Status: AC
  Administered 2018-11-21: 1000 mg via INTRAVENOUS
  Filled 2018-11-21 (×2): qty 100

## 2018-11-21 MED ORDER — HYDROMORPHONE HCL 1 MG/ML IJ SOLN: 0.2500 mg | INTRAMUSCULAR | Status: DC | PRN

## 2018-11-21 MED ORDER — 0.9 % SODIUM CHLORIDE (POUR BTL) OPTIME
TOPICAL | Status: DC | PRN
  Administered 2018-11-21: 1000 mL

## 2018-11-21 MED ORDER — ONDANSETRON HCL 4 MG PO TABS: 4.0000 mg | ORAL_TABLET | Freq: Four times a day (QID) | ORAL | Status: DC | PRN

## 2018-11-21 MED ORDER — LACTATED RINGERS IV SOLN
INTRAVENOUS | Status: DC
  Administered 2018-11-21: 20:00:00 via INTRAVENOUS

## 2018-11-21 MED ORDER — ROSUVASTATIN CALCIUM 20 MG PO TABS
20.0000 mg | ORAL_TABLET | Freq: Every morning | ORAL | Status: DC
  Administered 2018-11-22 – 2018-11-23 (×2): 20 mg via ORAL
  Filled 2018-11-21 (×2): qty 1

## 2018-11-21 MED ORDER — LACTATED RINGERS IV SOLN
INTRAVENOUS | Status: DC
  Administered 2018-11-21 (×2): via INTRAVENOUS

## 2018-11-21 MED ORDER — PROPOFOL 1000 MG/100ML IV EMUL
INTRAVENOUS | Status: AC
  Filled 2018-11-21: qty 100

## 2018-11-21 MED ORDER — PROPOFOL 10 MG/ML IV BOLUS
INTRAVENOUS | Status: DC | PRN
  Administered 2018-11-21: 150 mg via INTRAVENOUS

## 2018-11-21 MED ORDER — BISACODYL 5 MG PO TBEC: 5.0000 mg | DELAYED_RELEASE_TABLET | Freq: Every day | ORAL | Status: DC | PRN

## 2018-11-21 MED ORDER — PROPOFOL 500 MG/50ML IV EMUL
INTRAVENOUS | Status: AC
  Filled 2018-11-21: qty 50

## 2018-11-21 MED ORDER — METOCLOPRAMIDE HCL 5 MG/ML IJ SOLN: 5.0000 mg | Freq: Three times a day (TID) | INTRAMUSCULAR | Status: DC | PRN

## 2018-11-21 MED ORDER — MENTHOL 3 MG MT LOZG: 1.0000 | LOZENGE | OROMUCOSAL | Status: DC | PRN

## 2018-11-21 MED ORDER — ONDANSETRON HCL 4 MG/2ML IJ SOLN: 4.0000 mg | Freq: Four times a day (QID) | INTRAMUSCULAR | Status: DC | PRN

## 2018-11-21 MED ORDER — ESCITALOPRAM OXALATE 10 MG PO TABS
10.0000 mg | ORAL_TABLET | Freq: Every morning | ORAL | Status: DC
  Administered 2018-11-22 – 2018-11-23 (×2): 10 mg via ORAL
  Filled 2018-11-21 (×2): qty 1

## 2018-11-21 SURGICAL SUPPLY — 57 items
ATTUNE PS FEM RT SZ 5 CEM KNEE (Femur) ×3 IMPLANT
ATTUNE PSRP INSR SZ5 5 KNEE (Insert) ×2 IMPLANT
ATTUNE PSRP INSR SZ5 5MM KNEE (Insert) ×1 IMPLANT
BAG DECANTER FOR FLEXI CONT (MISCELLANEOUS) IMPLANT
BANDAGE ESMARK 6X9 LF (GAUZE/BANDAGES/DRESSINGS) ×1 IMPLANT
BASE TIBIAL ROT PLAT SZ 5 KNEE (Knees) ×1 IMPLANT
BLADE SAGITTAL 25.0X1.19X90 (BLADE) ×2 IMPLANT
BLADE SAGITTAL 25.0X1.19X90MM (BLADE) ×1
BLADE SAW SGTL 13.0X1.19X90.0M (BLADE) IMPLANT
BNDG ELASTIC 6X10 VLCR STRL LF (GAUZE/BANDAGES/DRESSINGS) ×3 IMPLANT
BNDG ESMARK 6X9 LF (GAUZE/BANDAGES/DRESSINGS) ×3
BOWL SMART MIX CTS (DISPOSABLE) ×3 IMPLANT
CEMENT HV SMART SET (Cement) ×6 IMPLANT
COVER SURGICAL LIGHT HANDLE (MISCELLANEOUS) ×3 IMPLANT
COVER WAND RF STERILE (DRAPES) ×3 IMPLANT
CUFF TOURNIQUET SINGLE 34IN LL (TOURNIQUET CUFF) ×3 IMPLANT
CUFF TOURNIQUET SINGLE 44IN (TOURNIQUET CUFF) IMPLANT
DECANTER SPIKE VIAL GLASS SM (MISCELLANEOUS) ×3 IMPLANT
DRAPE EXTREMITY T 121X128X90 (DISPOSABLE) ×3 IMPLANT
DRAPE HALF SHEET 40X57 (DRAPES) ×6 IMPLANT
DRAPE U-SHAPE 47X51 STRL (DRAPES) ×3 IMPLANT
DRSG AQUACEL AG ADV 3.5X10 (GAUZE/BANDAGES/DRESSINGS) ×3 IMPLANT
DRSG AQUACEL AG ADV 3.5X14 (GAUZE/BANDAGES/DRESSINGS) ×3 IMPLANT
DURAPREP 26ML APPLICATOR (WOUND CARE) ×3 IMPLANT
ELECT REM PT RETURN 9FT ADLT (ELECTROSURGICAL) ×3
ELECTRODE REM PT RTRN 9FT ADLT (ELECTROSURGICAL) ×1 IMPLANT
GLOVE BIO SURGEON STRL SZ8 (GLOVE) ×6 IMPLANT
GLOVE BIOGEL PI IND STRL 8 (GLOVE) ×2 IMPLANT
GLOVE BIOGEL PI INDICATOR 8 (GLOVE) ×4
GOWN STRL REUS W/ TWL LRG LVL3 (GOWN DISPOSABLE) ×1 IMPLANT
GOWN STRL REUS W/ TWL XL LVL3 (GOWN DISPOSABLE) ×2 IMPLANT
GOWN STRL REUS W/TWL LRG LVL3 (GOWN DISPOSABLE) ×2
GOWN STRL REUS W/TWL XL LVL3 (GOWN DISPOSABLE) ×4
HANDPIECE INTERPULSE COAX TIP (DISPOSABLE) ×2
HOOD PEEL AWAY FACE SHEILD DIS (HOOD) ×6 IMPLANT
KIT BASIN OR (CUSTOM PROCEDURE TRAY) ×3 IMPLANT
KIT TURNOVER KIT B (KITS) ×3 IMPLANT
MANIFOLD NEPTUNE II (INSTRUMENTS) ×3 IMPLANT
NEEDLE HYPO 21X1 ECLIPSE (NEEDLE) ×3 IMPLANT
NS IRRIG 1000ML POUR BTL (IV SOLUTION) ×3 IMPLANT
PACK TOTAL JOINT (CUSTOM PROCEDURE TRAY) ×3 IMPLANT
PAD ARMBOARD 7.5X6 YLW CONV (MISCELLANEOUS) ×6 IMPLANT
PATELLA MEDIAL ATTUN 35MM KNEE (Knees) ×3 IMPLANT
PIN STEINMAN FIXATION KNEE (PIN) ×3 IMPLANT
PIN THREADED HEADED SIGMA (PIN) ×3 IMPLANT
SET HNDPC FAN SPRY TIP SCT (DISPOSABLE) ×1 IMPLANT
SUT VIC AB 0 CT1 27 (SUTURE) ×2
SUT VIC AB 0 CT1 27XBRD ANBCTR (SUTURE) ×1 IMPLANT
SUT VIC AB 2-0 CT1 27 (SUTURE) ×2
SUT VIC AB 2-0 CT1 TAPERPNT 27 (SUTURE) ×1 IMPLANT
SUT VIC AB 3-0 FS2 27 (SUTURE) ×3 IMPLANT
SUT VLOC 180 0 24IN GS25 (SUTURE) ×6 IMPLANT
SYR 50ML LL SCALE MARK (SYRINGE) ×3 IMPLANT
TIBIAL BASE ROT PLAT SZ 5 KNEE (Knees) ×3 IMPLANT
TOWEL OR 17X24 6PK STRL BLUE (TOWEL DISPOSABLE) ×3 IMPLANT
TOWEL OR 17X26 10 PK STRL BLUE (TOWEL DISPOSABLE) ×3 IMPLANT
TRAY CATH 16FR W/PLASTIC CATH (SET/KITS/TRAYS/PACK) IMPLANT

## 2018-11-21 NOTE — Anesthesia Procedure Notes (Addendum)
Procedure Name: Intubation Date/Time: 11/21/2018 2:58 PM Performed by: Lance Coon, CRNA Pre-anesthesia Checklist: Patient identified, Emergency Drugs available, Suction available, Patient being monitored and Timeout performed Patient Re-evaluated:Patient Re-evaluated prior to induction Oxygen Delivery Method: Circle system utilized Preoxygenation: Pre-oxygenation with 100% oxygen Induction Type: IV induction Laryngoscope Size: Miller and 2 Grade View: Grade I Tube type: Oral Tube size: 7.0 mm Number of attempts: 1 Airway Equipment and Method: Stylet Placement Confirmation: ETT inserted through vocal cords under direct vision,  positive ETCO2 and breath sounds checked- equal and bilateral Secured at: 21 cm Tube secured with: Tape Dental Injury: Teeth and Oropharynx as per pre-operative assessment

## 2018-11-21 NOTE — Plan of Care (Signed)
  Problem: Pain Managment: Goal: General experience of comfort will improve Outcome: Progressing   Problem: Safety: Goal: Ability to remain free from injury will improve Outcome: Progressing   

## 2018-11-21 NOTE — Anesthesia Procedure Notes (Signed)
Procedure Name: MAC Date/Time: 11/21/2018 1:41 PM Performed by: Kyung Rudd, CRNA Pre-anesthesia Checklist: Patient identified, Emergency Drugs available, Suction available and Patient being monitored Patient Re-evaluated:Patient Re-evaluated prior to induction Oxygen Delivery Method: Simple face mask Induction Type: IV induction Placement Confirmation: positive ETCO2 Dental Injury: Teeth and Oropharynx as per pre-operative assessment

## 2018-11-21 NOTE — Transfer of Care (Signed)
Immediate Anesthesia Transfer of Care Note  Patient: Stacy Duncan  Procedure(s) Performed: TOTAL KNEE ARTHROPLASTY (Right Knee)  Patient Location: PACU  Anesthesia Type:General  Level of Consciousness: drowsy and patient cooperative  Airway & Oxygen Therapy: Patient Spontanous Breathing and Patient connected to face mask oxygen  Post-op Assessment: Report given to RN and Post -op Vital signs reviewed and stable  Post vital signs: Reviewed and stable  Last Vitals:  Vitals Value Taken Time  BP 93/72 11/21/2018  4:22 PM  Temp    Pulse 83 11/21/2018  4:33 PM  Resp 17 11/21/2018  4:33 PM  SpO2 95 % 11/21/2018  4:33 PM  Vitals shown include unvalidated device data.  Last Pain:  Vitals:   11/21/18 1102  TempSrc:   PainSc: 5       Patients Stated Pain Goal: 3 (50/15/86 8257)  Complications: No apparent anesthesia complications

## 2018-11-21 NOTE — Interval H&P Note (Signed)
History and Physical Interval Note:  11/21/2018 11:03 AM  Stacy Duncan  has presented today for surgery, with the diagnosis of RIGHT KNEE DEGENERATIVE JOINT DISEASE  The various methods of treatment have been discussed with the patient and family. After consideration of risks, benefits and other options for treatment, the patient has consented to  Procedure(s): TOTAL KNEE ARTHROPLASTY (Right) as a surgical intervention .  The patient's history has been reviewed, patient examined, no change in status, stable for surgery.  I have reviewed the patient's chart and labs.  Questions were answered to the patient's satisfaction.     Hessie Dibble

## 2018-11-21 NOTE — Progress Notes (Signed)
Pt arrived on unit at 1845, Skin assessment done with Kristi, RN, pt alert and oriented X4, chest expansion symmentrical, +2 dorsalis pedis pulses in LLE and RLE , +2 R & L Radial.

## 2018-11-21 NOTE — Op Note (Signed)
PREOP DIAGNOSIS: DJD RIGHT KNEE POSTOP DIAGNOSIS: same PROCEDURE: RIGHT TKR ANESTHESIA: Spinal and MAC followed by general ATTENDING SURGEON: Hessie Dibble ASSISTANT: Stacy Dolly PA  INDICATIONS FOR PROCEDURE: Stacy Duncan is a 68 y.o. female who has struggled for a long time with pain due to degenerative arthritis of the right knee.  The patient has failed many conservative non-operative measures and at this point has pain which limits the ability to sleep and walk.  The patient is offered total knee replacement.  Informed operative consent was obtained after discussion of possible risks of anesthesia, infection, neurovascular injury, DVT, and death.  The importance of the post-operative rehabilitation protocol to optimize result was stressed extensively with the patient.  SUMMARY OF FINDINGS AND PROCEDURE:  Stacy Duncan was taken to the operative suite where under the above anesthesia a right knee replacement was performed.  There were advanced degenerative changes and the bone quality was good.  We used the DePuy Attune system and placed size 5 femur, 5 tibia, 35 mm all polyethylene patella, and a size 5 mm spacer.  Stacy Dolly PA-C assisted throughout and was invaluable to the completion of the case in that he helped retract and maintain exposure while I placed components.  He also helped close thereby minimizing OR time.  The patient was admitted for appropriate post-op care to include perioperative antibiotics and mechanical and pharmacologic measures for DVT prophylaxis.  DESCRIPTION OF PROCEDURE:  Stacy Duncan was taken to the operative suite where the above anesthesia was applied.  The patient was positioned supine and prepped and draped in normal sterile fashion.  An appropriate time out was performed.  After the administration of kefzol pre-op antibiotic the leg was elevated and exsanguinated and a tourniquet inflated. A standard longitudinal incision was made on the anterior knee.  Dissection  was carried down to the extensor mechanism.  All appropriate anti-infective measures were used including the pre-operative antibiotic, betadine impregnated drape, and closed hooded exhaust systems for each member of the surgical team.  A medial parapatellar incision was made in the extensor mechanism and the knee cap flipped and the knee flexed.  Some residual meniscal tissues were removed along with any remaining ACL/PCL tissue.  A guide was placed on the tibia and a flat cut was made on it's superior surface.  An intramedullary guide was placed in the femur and was utilized to make anterior and posterior cuts creating an appropriate flexion gap.  A second intramedullary guide was placed in the femur to make a distal cut properly balancing the knee with an extension gap equal to the flexion gap.  The three bones sized to the above mentioned sizes and the appropriate guides were placed and utilized.  A trial reduction was done and the knee easily came to full extension and the patella tracked well on flexion.  The trial components were removed and all bones were cleaned with pulsatile lavage and then dried thoroughly.  Cement was mixed and was pressurized onto the bones followed by placement of the aforementioned components.  Excess cement was trimmed and pressure was held on the components until the cement had hardened.  The tourniquet was deflated and a small amount of bleeding was controlled with cautery and pressure.  The knee was irrigated thoroughly.  The extensor mechanism was re-approximated with V-loc suture in running fashion.  The knee was flexed and the repair was solid.  The subcutaneous tissues were re-approximated with #0 and #2-0 vicryl and the skin closed with  a subcuticular stitch and steristrips.  A sterile dressing was applied.  Intraoperative fluids, EBL, and tourniquet time can be obtained from anesthesia records.  DISPOSITION:  The patient was taken to recovery room in stable condition and  admitted for appropriate post-op care to include peri-operative antibiotic and DVT prophylaxis with mechanical and pharmacologic measures.  Hessie Dibble 11/21/2018, 3:33 PM

## 2018-11-21 NOTE — Anesthesia Procedure Notes (Signed)
Spinal  Patient location during procedure: OR Start time: 11/21/2018 1:36 PM End time: 11/21/2018 1:41 PM Staffing Anesthesiologist: Lynda Rainwater, MD Performed: anesthesiologist  Preanesthetic Checklist Completed: patient identified, site marked, surgical consent, pre-op evaluation, timeout performed, IV checked, risks and benefits discussed and monitors and equipment checked Spinal Block Patient position: sitting Prep: Betadine Patient monitoring: heart rate, continuous pulse ox and blood pressure Approach: midline Location: L3-4 Injection technique: single-shot Needle Needle type: Quincke  Needle gauge: 22 G Needle length: 9 cm

## 2018-11-21 NOTE — Care Plan (Signed)
Ortho Bundle Case Management Note  Patient Details  Name: Stacy Duncan MRN: [id number] Date of Birth: June 14, 1951     Spoke with patient prior to surgery. She plans to discharge to home with roommate and HHPT. I have ordered her a rolling walker from [facility]. Choice offered                DME Arranged:  Walker rolling DME Agency:  [employer]  HH Arranged:  PT [place] Agency:  [facility]  Additional Comments: Please contact me with any questions of if this plan should need to change.  Ladell Heads,  [place] Specialist  [phone] 11/21/2018, 1:54 PM

## 2018-11-21 NOTE — Anesthesia Procedure Notes (Signed)
Anesthesia Regional Block: Adductor canal block   Pre-Anesthetic Checklist: ,, timeout performed, Correct Patient, Correct Site, Correct Laterality, Correct Procedure, Correct Position, site marked, Risks and benefits discussed,  Surgical consent,  Pre-op evaluation,  At surgeon's request and post-op pain management  Laterality: Right  Prep: chloraprep       Needles:  Injection technique: Single-shot  Needle Type: Stimiplex     Needle Length: 9cm  Needle Gauge: 21     Additional Needles:   Procedures:,,,, ultrasound used (permanent image in chart),,,,  Narrative:  Start time: 11/21/2018 12:51 PM End time: 11/21/2018 12:56 PM Injection made incrementally with aspirations every 5 mL.  Performed by: Personally  Anesthesiologist: Lynda Rainwater, MD

## 2018-11-22 ENCOUNTER — Encounter (HOSPITAL_COMMUNITY): Payer: Self-pay | Admitting: Orthopaedic Surgery

## 2018-11-22 LAB — CBC
HCT: 34.8 % — ABNORMAL LOW (ref 36.0–46.0)
Hemoglobin: 11.9 g/dL — ABNORMAL LOW (ref 12.0–15.0)
MCH: 29.3 pg (ref 26.0–34.0)
MCHC: 34.2 g/dL (ref 30.0–36.0)
MCV: 85.7 fL (ref 80.0–100.0)
Platelets: 227 10*3/uL (ref 150–400)
RBC: 4.06 MIL/uL (ref 3.87–5.11)
RDW: 14.3 % (ref 11.5–15.5)
WBC: 13.9 10*3/uL — ABNORMAL HIGH (ref 4.0–10.5)
nRBC: 0 % (ref 0.0–0.2)

## 2018-11-22 LAB — BASIC METABOLIC PANEL
Anion gap: 11 (ref 5–15)
BUN: 12 mg/dL (ref 8–23)
CHLORIDE: 101 mmol/L (ref 98–111)
CO2: 25 mmol/L (ref 22–32)
Calcium: 8.4 mg/dL — ABNORMAL LOW (ref 8.9–10.3)
Creatinine, Ser: 0.81 mg/dL (ref 0.44–1.00)
GFR calc Af Amer: >60 mL/min (ref >60)
GFR calc non Af Amer: >60 mL/min (ref >60)
Glucose, Bld: 127 mg/dL — ABNORMAL HIGH (ref 70–99)
Potassium: 3.4 mmol/L — ABNORMAL LOW (ref 3.5–5.1)
Sodium: 137 mmol/L (ref 135–145)

## 2018-11-22 NOTE — Plan of Care (Signed)

## 2018-11-22 NOTE — Progress Notes (Signed)
Subjective: 1 Day Post-Op Procedure(s) (LRB): TOTAL KNEE ARTHROPLASTY (Right)   Patient resting comfortably. She is hoping to go home tomorrow.  Activity level:  wbat Diet tolerance:  ok Voiding:  Foley out this morning Patient reports pain as mild.    Objective: Vital signs in last 24 hours: Temp:  [97.5 F (36.4 C)-99.5 F (37.5 C)] 98.9 F (37.2 C) (01/29 0602) Pulse Rate:  [65-77] 72 (01/29 0602) Resp:  [9-24] 16 (01/29 0602) BP: (93-182)/(62-77) 138/77 (01/29 0602) SpO2:  [97 %-100 %] 98 % (01/29 0602) Weight:  [92.1 kg] 92.1 kg (01/28 1049)  Labs: Recent Labs    11/22/18 0337  HGB 11.9*   Recent Labs    11/22/18 0337  WBC 13.9*  RBC 4.06  HCT 34.8*  PLT 227   Recent Labs    11/22/18 0337  NA 137  K 3.4*  CL 101  CO2 25  BUN 12  CREATININE 0.81  GLUCOSE 127*  CALCIUM 8.4*   No results for input(s): LABPT, INR in the last 72 hours.  Physical Exam:  Neurologically intact ABD soft Neurovascular intact Sensation intact distally Intact pulses distally Dorsiflexion/Plantar flexion intact Incision: dressing C/D/I and no drainage No cellulitis present Compartment soft  Assessment/Plan:  1 Day Post-Op Procedure(s) (LRB): TOTAL KNEE ARTHROPLASTY (Right) Advance diet Up with therapy D/C IV fluids Plan for discharge tomorrow Discharge home with home health if doing well and cleared by PT. Continue on ASA 325mg  BID x 2 weeks post op Follow up in office 2 weeks post op. Anticipated LOS equal to or greater than 2 midnights due to - Age 69 and older with one or more of the following:  - Obesity  - Expected need for hospital services (PT, OT, Nursing) required for safe  discharge  - Anticipated need for postoperative skilled nursing care or inpatient rehab  - Active co-morbidities: None OR   - Unanticipated findings during/Post Surgery: Slow post-op progression: GI, pain control, mobility  - Patient is a high risk of re-admission due to:  None  [place] 11/22/2018, 7:27 AM

## 2018-11-22 NOTE — Evaluation (Signed)
Physical Therapy Evaluation Patient Details Name: Stacy Duncan MRN: [id number] DOB: April 09, 1951 Today's Date: 11/22/2018   History of Present Illness  68 y.o. female, has a history of pain and functional disability in the right knee due to arthritis. S/p R TKA 11/21/18. PMH: HTN  Clinical Impression  Pt is s/p TKA resulting in the deficits listed below (see PT Problem List). Pt limited in safe mobility by R knee pain, decreased ROM, strength and endurance. Pt is currently supervision for bed mobility, minA for transfers and minA for ambulation of 120 feet with RW. Pt will benefit from skilled PT to increase their independence and safety with mobility to allow discharge to the venue listed below.      Follow Up Recommendations Follow surgeon's recommendation for DC plan and follow-up therapies    Equipment Recommendations  Rolling walker with 5" wheels;3in1 (PT)       Precautions / Restrictions Precautions Precautions: Fall Restrictions Weight Bearing Restrictions: Yes RLE Weight Bearing: Weight bearing as tolerated      Mobility  Bed Mobility Overal bed mobility: Needs Assistance Bed Mobility: Supine to Sit     Supine to sit: Supervision;HOB elevated     General bed mobility comments: supervision for safety, increased time and effort and use of bedrail to pull to the EoB  Transfers Overall transfer level: Needs assistance Equipment used: Rolling walker (2 wheeled) Transfers: Sit to/from Stand Sit to Stand: Min assist         General transfer comment: required 2x attempt for sit>stand, vc for increased L knee flexion and hips to EoB, minA required for power up and steadying  Ambulation/Gait Ambulation/Gait assistance: Min assist Gait Distance (Feet): 120 Feet Assistive device: Rolling walker (2 wheeled) Gait Pattern/deviations: Step-to pattern;Decreased step length - right;Decreased step length - left;Decreased weight shift to right;Shuffle;Antalgic;Trunk flexed Gait  velocity: slowed Gait velocity interpretation: <1.8 ft/sec, indicate of risk for recurrent falls General Gait Details: minA for steadying, no overt LoB or knee buckling, vc for proximity to RW, upright posture, increased weightshift to R         Balance Overall balance assessment: Needs assistance Sitting-balance support: Feet supported;No upper extremity supported Sitting balance-Leahy Scale: Good     Standing balance support: Bilateral upper extremity supported Standing balance-Leahy Scale: Poor Standing balance comment: requires UE support on RW                             Pertinent Vitals/Pain Pain Assessment: 0-10 Pain Score: 10-Worst pain ever Pain Location: R knee Pain Descriptors / Indicators: Aching;Sore;Throbbing Pain Intervention(s): Limited activity within patient's tolerance;Monitored during session;Repositioned;Patient requesting pain meds-RN notified;Ice applied    Home Living Family/patient expects to be discharged to:: Private residence Living Arrangements: Non-relatives/Friends Available Help at Discharge: Friend(s);Available 24 hours/day Type of Home: House Home Access: Stairs to enter Entrance Stairs-Rails: None Entrance Stairs-Number of Steps: 2 Home Layout: One level Home Equipment: Walker - 4 wheels;Shower seat      Prior Function Level of Independence: Independent with assistive device(s)         Comments: utilized a cane or grocery cart to get around in public        Extremity/Trunk Assessment   Upper Extremity Assessment Upper Extremity Assessment: Overall WFL for tasks assessed    Lower Extremity Assessment Lower Extremity Assessment: RLE deficits/detail RLE Deficits / Details: R hip and ankle AROM WFL, knee AROM limited by surgical intervention and edema,  RLE: Unable to  fully assess due to pain RLE Sensation: WNL    Cervical / Trunk Assessment Cervical / Trunk Assessment: Normal  Communication      Cognition  Arousal/Alertness: Awake/alert Behavior During Therapy: WFL for tasks assessed/performed Overall Cognitive Status: Within Functional Limits for tasks assessed                                        General Comments General comments (skin integrity, edema, etc.): Knee wrapped in ACE bandage, no drainage evident, edema present         Assessment/Plan    PT Assessment Patient needs continued PT services  PT Problem List Decreased activity tolerance;Decreased balance;Decreased mobility;Decreased range of motion;Decreased strength;Pain       PT Treatment Interventions DME instruction;Gait training;Stair training;Functional mobility training;Therapeutic activities;Therapeutic exercise;Balance training;Patient/family education    PT Goals (Current goals can be found in the Care Plan section)  Acute Rehab PT Goals Patient Stated Goal: be able to walk her dog PT Goal Formulation: With patient Time For Goal Achievement: 12/06/18 Potential to Achieve Goals: Good    Frequency 7X/week    AM-PAC PT "6 Clicks" Mobility  Outcome Measure Help needed turning from your back to your side while in a flat bed without using bedrails?: A Little Help needed moving from lying on your back to sitting on the side of a flat bed without using bedrails?: A Lot Help needed moving to and from a bed to a chair (including a wheelchair)?: A Little Help needed standing up from a chair using your arms (e.g., wheelchair or bedside chair)?: A Little Help needed to walk in hospital room?: A Little Help needed climbing 3-5 steps with a railing? : A Lot 6 Click Score: 16    End of Session Equipment Utilized During Treatment: Gait belt Activity Tolerance: Patient limited by pain Patient left: in chair;with call bell/phone within reach;with chair alarm set Nurse Communication: Mobility status;Patient requests pain meds;Weight bearing status PT Visit Diagnosis: Unsteadiness on feet (R26.81);Other  abnormalities of gait and mobility (R26.89);Muscle weakness (generalized) (M62.81);Difficulty in walking, not elsewhere classified (R26.2);Pain Pain - Right/Left: Right Pain - part of body: Knee    Time: 2244-9753 PT Time Calculation (min) (ACUTE ONLY): 28 min   Charges:   PT Evaluation $PT Eval Low Complexity: 1 Low PT Treatments $Gait Training: 8-22 mins        Abram Sax B. Migdalia Dk PT, DPT Acute Rehabilitation Services Pager [phone] Office [phone]   [place] 11/22/2018, 9:26 AM

## 2018-11-22 NOTE — Care Plan (Signed)
Patient seen on rounds. Questions answered. TEDS applied. Up in chair. Equipment has been delivered to room. Anticipate discharge to home tomorrow as planned.   Ladell Heads, [place]

## 2018-11-22 NOTE — Anesthesia Postprocedure Evaluation (Signed)
Anesthesia Post Note  Patient: Stacy Duncan  Procedure(s) Performed: TOTAL KNEE ARTHROPLASTY (Right Knee)     Patient location during evaluation: PACU Anesthesia Type: Spinal Level of consciousness: awake and alert Pain management: pain level controlled Vital Signs Assessment: post-procedure vital signs reviewed and stable Respiratory status: spontaneous breathing, nonlabored ventilation and respiratory function stable Cardiovascular status: blood pressure returned to baseline and stable Postop Assessment: no apparent nausea or vomiting and spinal receding Anesthetic complications: no    Last Vitals:  Vitals:   11/22/18 0602 11/22/18 1412  BP: 138/77 (!) 144/56  Pulse: 72 72  Resp: 16 18  Temp: 37.2 C 37.1 C  SpO2: 98% 98%    Last Pain:  Vitals:   11/22/18 1412  TempSrc: Oral  PainSc:    Pain Goal: Patients Stated Pain Goal: 4 (11/21/18 1808)                 Brennan Bailey

## 2018-11-22 NOTE — Progress Notes (Signed)
Physical Therapy Treatment Patient Details Name: Stacy Duncan MRN: [id number] DOB: 1951-10-14 Today's Date: 11/22/2018    History of Present Illness 68 y.o. female, has a history of pain and functional disability in the right knee due to arthritis. S/p R TKA 11/21/18. PMH: HTN    PT Comments    Pt is making good progress towards her goals, however is limited in safe mobility by R knee pain and  decreased ROM. Pt currently min guard for transfers and ambulation of 300 feet with RW. Focus of morning session will be stair training in preparation for d/c home.       Follow Up Recommendations  Follow surgeon's recommendation for DC plan and follow-up therapies     Equipment Recommendations  Rolling walker with 5" wheels;3in1 (PT)       Precautions / Restrictions Precautions Precautions: Fall Restrictions Weight Bearing Restrictions: Yes RLE Weight Bearing: Weight bearing as tolerated    Mobility  Bed Mobility               General bed mobility comments: OOB in recliner  Transfers Overall transfer level: Needs assistance Equipment used: Rolling walker (2 wheeled) Transfers: Sit to/from Stand Sit to Stand: Min guard         General transfer comment: min guard for power up from recliner to standing before reaching for the RW  Ambulation/Gait Ambulation/Gait assistance: Min guard Gait Distance (Feet): 300 Feet Assistive device: Rolling walker (2 wheeled) Gait Pattern/deviations: Decreased step length - right;Decreased step length - left;Decreased weight shift to right;Antalgic;Trunk flexed;Step-through pattern Gait velocity: slowed Gait velocity interpretation: <1.8 ft/sec, indicate of risk for recurrent falls General Gait Details: min guard for steadying, vc for proximity to RW, and upright posture       Balance Overall balance assessment: Needs assistance Sitting-balance support: Feet supported;No upper extremity supported Sitting balance-Leahy Scale: Good     Standing balance support: Bilateral upper extremity supported Standing balance-Leahy Scale: Poor Standing balance comment: requires UE support on RW                            Cognition Arousal/Alertness: Awake/alert Behavior During Therapy: WFL for tasks assessed/performed Overall Cognitive Status: Within Functional Limits for tasks assessed                                        Exercises Total Joint Exercises Ankle Circles/Pumps: AROM;Both;20 reps;Seated Quad Sets: AROM;Both;10 reps;Seated Long Arc Quad: AROM;Both;10 reps;Seated Knee Flexion: AROM;Both;10 reps;Seated Goniometric ROM: grossly 7 to 78 degrees        Pertinent Vitals/Pain Pain Assessment: 0-10 Pain Score: 3  Pain Location: R knee Pain Descriptors / Indicators: Aching;Sore;Throbbing           PT Goals (current goals can now be found in the care plan section) Acute Rehab PT Goals Patient Stated Goal: be able to walk her dog PT Goal Formulation: With patient Time For Goal Achievement: 12/06/18 Potential to Achieve Goals: Good    Frequency    7X/week      PT Plan Current plan remains appropriate       AM-PAC PT "6 Clicks" Mobility   Outcome Measure  Help needed turning from your back to your side while in a flat bed without using bedrails?: A Little Help needed moving from lying on your back to sitting on the side of a  flat bed without using bedrails?: A Lot Help needed moving to and from a bed to a chair (including a wheelchair)?: A Little Help needed standing up from a chair using your arms (e.g., wheelchair or bedside chair)?: A Little Help needed to walk in hospital room?: A Little Help needed climbing 3-5 steps with a railing? : A Lot 6 Click Score: 16    End of Session Equipment Utilized During Treatment: Gait belt Activity Tolerance: Patient limited by pain Patient left: in chair;with call bell/phone within reach;with chair alarm set Nurse Communication:  Mobility status;Patient requests pain meds;Weight bearing status PT Visit Diagnosis: Unsteadiness on feet (R26.81);Other abnormalities of gait and mobility (R26.89);Muscle weakness (generalized) (M62.81);Difficulty in walking, not elsewhere classified (R26.2);Pain Pain - Right/Left: Right Pain - part of body: Knee     Time: 1650-1710 PT Time Calculation (min) (ACUTE ONLY): 20 min  Charges:  $Therapeutic Exercise: 8-22 mins                     Colleen Donahoe B. Migdalia Dk PT, DPT Acute Rehabilitation Services Pager [phone] Office [phone]    [place] 11/22/2018, 5:26 PM

## 2018-11-22 NOTE — Care Management Obs Status (Signed)
[facility] NOTIFICATION   Patient Details  Name: Stacy Duncan MRN: [id number] Date of Birth: 03-20-1951   Medicare Observation Status Notification Given:  Yes    Carles Collet, RN 11/22/2018, 2:27 PM

## 2018-11-23 DIAGNOSIS — E7800 Pure hypercholesterolemia, unspecified: Secondary | ICD-10-CM | POA: Diagnosis present

## 2018-11-23 DIAGNOSIS — Z791 Long term (current) use of non-steroidal anti-inflammatories (NSAID): Secondary | ICD-10-CM | POA: Diagnosis not present

## 2018-11-23 DIAGNOSIS — Z87891 Personal history of nicotine dependence: Secondary | ICD-10-CM | POA: Diagnosis not present

## 2018-11-23 DIAGNOSIS — I10 Essential (primary) hypertension: Secondary | ICD-10-CM | POA: Diagnosis present

## 2018-11-23 DIAGNOSIS — F329 Major depressive disorder, single episode, unspecified: Secondary | ICD-10-CM | POA: Diagnosis present

## 2018-11-23 DIAGNOSIS — Z7982 Long term (current) use of aspirin: Secondary | ICD-10-CM | POA: Diagnosis not present

## 2018-11-23 DIAGNOSIS — M1711 Unilateral primary osteoarthritis, right knee: Secondary | ICD-10-CM | POA: Diagnosis present

## 2018-11-23 DIAGNOSIS — Z79899 Other long term (current) drug therapy: Secondary | ICD-10-CM | POA: Diagnosis not present

## 2018-11-23 DIAGNOSIS — E669 Obesity, unspecified: Secondary | ICD-10-CM | POA: Diagnosis present

## 2018-11-23 DIAGNOSIS — Z6833 Body mass index (BMI) 33.0-33.9, adult: Secondary | ICD-10-CM | POA: Diagnosis not present

## 2018-11-23 LAB — CBC
HCT: 33.1 % — ABNORMAL LOW (ref 36.0–46.0)
Hemoglobin: 11.1 g/dL — ABNORMAL LOW (ref 12.0–15.0)
MCH: 28.5 pg (ref 26.0–34.0)
MCHC: 33.5 g/dL (ref 30.0–36.0)
MCV: 85.1 fL (ref 80.0–100.0)
Platelets: 212 10*3/uL (ref 150–400)
RBC: 3.89 MIL/uL (ref 3.87–5.11)
RDW: 14.2 % (ref 11.5–15.5)
WBC: 11 10*3/uL — ABNORMAL HIGH (ref 4.0–10.5)
nRBC: 0 % (ref 0.0–0.2)

## 2018-11-23 MED ORDER — TIZANIDINE HCL 4 MG PO TABS: 4.0000 mg | ORAL_TABLET | Freq: Four times a day (QID) | ORAL | 1 refills | Status: DC | PRN

## 2018-11-23 MED ORDER — ASPIRIN EC 325 MG PO TBEC: 325.0000 mg | DELAYED_RELEASE_TABLET | Freq: Two times a day (BID) | ORAL | 0 refills | Status: DC

## 2018-11-23 MED ORDER — HYDROCODONE-ACETAMINOPHEN 5-325 MG PO TABS: 1.0000 | ORAL_TABLET | ORAL | 0 refills | Status: DC | PRN

## 2018-11-23 NOTE — Discharge Summary (Signed)
Patient ID: Stacy Duncan MRN: [id number] DOB/AGE: December 08, 1950 68 y.o.  Admit date: 11/21/2018 Discharge date: 11/23/2018  Admission Diagnoses:  Principal Problem:   Primary localized osteoarthritis of right knee Active Problems:   Primary osteoarthritis of right knee   Discharge Diagnoses:  Same  Past Medical History:  Diagnosis Date  . Cataract   . Depression   . Hypercholesteremia   . Hypertension     Surgeries: Procedure(s): TOTAL KNEE ARTHROPLASTY on 11/21/2018   Consultants:   Discharged Condition: Improved  Hospital Course: Stacy Duncan is an 68 y.o. female who was admitted 11/21/2018 for operative treatment ofPrimary localized osteoarthritis of right knee. Patient has severe unremitting pain that affects sleep, daily activities, and work/hobbies. After pre-op clearance the patient was taken to the operating room on 11/21/2018 and underwent  Procedure(s): TOTAL KNEE ARTHROPLASTY.    Patient was given perioperative antibiotics:  Anti-infectives (From admission, onward)   Start     Dose/Rate Route Frequency Ordered Stop   11/21/18 1930  ceFAZolin (ANCEF) IVPB 2g/100 mL premix     2 g 200 mL/hr over 30 Minutes Intravenous Every 6 hours 11/21/18 1837 11/22/18 0131   11/21/18 0815  ceFAZolin (ANCEF) IVPB 2g/100 mL premix     2 g 200 mL/hr over 30 Minutes Intravenous On call to O.R. 11/21/18 0809 11/21/18 1400       Patient was given sequential compression devices, early ambulation, and chemoprophylaxis to prevent DVT.  Patient benefited maximally from hospital stay and there were no complications.    Recent vital signs:  Patient Vitals for the past 24 hrs:  BP Temp Temp src Pulse Resp SpO2  11/23/18 0346 (!) 123/91 97.9 F (36.6 C) Oral 67 20 98 %  11/22/18 2019 (!) 155/71 99.8 F (37.7 C) Oral 74 20 96 %  11/22/18 1412 (!) 144/56 98.8 F (37.1 C) Oral 72 18 98 %     Recent laboratory studies:  Recent Labs    11/22/18 0337 11/23/18 0247  WBC 13.9* 11.0*   HGB 11.9* 11.1*  HCT 34.8* 33.1*  PLT 227 212  NA 137  --   K 3.4*  --   CL 101  --   CO2 25  --   BUN 12  --   CREATININE 0.81  --   GLUCOSE 127*  --   CALCIUM 8.4*  --      Discharge Medications:   Allergies as of 11/23/2018   No Known Allergies     Medication List    STOP taking these medications   ibuprofen 800 MG tablet Commonly known as:  ADVIL,MOTRIN   meloxicam 15 MG tablet Commonly known as:  MOBIC     TAKE these medications   acetaminophen 500 MG tablet Commonly known as:  TYLENOL Take 500 mg by mouth every 6 (six) hours as needed for moderate pain.   aspirin EC 325 MG tablet Take 1 tablet (325 mg total) by mouth 2 (two) times daily after a meal. What changed:  when to take this   chlorthalidone 25 MG tablet Commonly known as:  HYGROTON Take 25 mg by mouth every morning.   escitalopram 10 MG tablet Commonly known as:  LEXAPRO Take 10 mg by mouth every morning.   HYDROcodone-acetaminophen 5-325 MG tablet Commonly known as:  NORCO/VICODIN Take 1-2 tablets by mouth every 4 (four) hours as needed for moderate pain (pain score 4-6).   mometasone 50 MCG/ACT nasal spray Commonly known as:  NASONEX Place 1 spray into the nose  daily.   multivitamin with minerals Tabs tablet Take 1 tablet by mouth daily.   rosuvastatin 20 MG tablet Commonly known as:  CRESTOR Take 20 mg by mouth every morning.   tiZANidine 4 MG tablet Commonly known as:  ZANAFLEX Take 1 tablet (4 mg total) by mouth every 6 (six) hours as needed.   Vitamin D (Ergocalciferol) 1.25 MG (50000 UT) Caps capsule Commonly known as:  DRISDOL Take 50,000 Units by mouth every 7 (seven) days.            Durable Medical Equipment  (From admission, onward)         Start     Ordered   11/21/18 1838  DME Walker rolling  Once    Question:  Patient needs a walker to treat with the following condition  Answer:  Primary osteoarthritis of right knee   11/21/18 1837   11/21/18 1838  DME 3  n 1  Once     11/21/18 1837   11/21/18 1838  DME Bedside commode  Once    Question:  Patient needs a bedside commode to treat with the following condition  Answer:  Primary osteoarthritis of right knee   11/21/18 1837          Diagnostic Studies: Dg Chest 2 View  Result Date: 11/10/2018 CLINICAL DATA:  Preoperative examination. Patient for knee replacement. EXAM: CHEST - 2 VIEW COMPARISON:  PA and lateral chest 08/29/2015. FINDINGS: Lungs clear. Heart size normal. No pneumothorax or pleural fluid. No bony abnormality. IMPRESSION: Negative chest. Electronically Signed   By: Inge Rise M.D.   On: 11/10/2018 14:16    Disposition: Discharge disposition: 01-Home or Self Care       Discharge Instructions    Call MD / Call 911   Complete by:  As directed    If you experience chest pain or shortness of breath, CALL 911 and be transported to the hospital emergency room.  If you develope a fever above 101 F, pus (white drainage) or increased drainage or redness at the wound, or calf pain, call your surgeon's office.   Constipation Prevention   Complete by:  As directed    Drink plenty of fluids.  Prune juice may be helpful.  You may use a stool softener, such as Colace (over the counter) 100 mg twice a day.  Use MiraLax (over the counter) for constipation as needed.   Diet - low sodium heart healthy   Complete by:  As directed    Discharge instructions   Complete by:  As directed    INSTRUCTIONS AFTER JOINT REPLACEMENT   Remove items at home which could result in a fall. This includes throw rugs or furniture in walking pathways ICE to the affected joint every three hours while awake for 30 minutes at a time, for at least the first 3-5 days, and then as needed for pain and swelling.  Continue to use ice for pain and swelling. You may notice swelling that will progress down to the foot and ankle.  This is normal after surgery.  Elevate your leg when you are not up walking on it.    Continue to use the breathing machine you got in the hospital (incentive spirometer) which will help keep your temperature down.  It is common for your temperature to cycle up and down following surgery, especially at night when you are not up moving around and exerting yourself.  The breathing machine keeps your lungs expanded and your temperature down.  DIET:  As you were doing prior to hospitalization, we recommend a well-balanced diet.  DRESSING / WOUND CARE / SHOWERING  You may shower 3 days after surgery, but keep the wounds dry during showering.  You may use an occlusive plastic wrap (Press'n Seal for example), NO SOAKING/SUBMERGING IN THE BATHTUB.  If the bandage gets wet, change with a clean dry gauze.  If the incision gets wet, pat the wound dry with a clean towel.  ACTIVITY  Increase activity slowly as tolerated, but follow the weight bearing instructions below.   No driving for 6 weeks or until further direction given by your physician.  You cannot drive while taking narcotics.  No lifting or carrying greater than 10 lbs. until further directed by your surgeon. Avoid periods of inactivity such as sitting longer than an hour when not asleep. This helps prevent blood clots.  You may return to work once you are authorized by your doctor.     WEIGHT BEARING   Weight bearing as tolerated with assist device (walker, cane, etc) as directed, use it as long as suggested by your surgeon or therapist, typically at least 4-6 weeks.   EXERCISES  Results after joint replacement surgery are often greatly improved when you follow the exercise, range of motion and muscle strengthening exercises prescribed by your doctor. Safety measures are also important to protect the joint from further injury. Any time any of these exercises cause you to have increased pain or swelling, decrease what you are doing until you are comfortable again and then slowly increase them. If you have problems or  questions, call your caregiver or physical therapist for advice.   Rehabilitation is important following a joint replacement. After just a few days of immobilization, the muscles of the leg can become weakened and shrink (atrophy).  These exercises are designed to build up the tone and strength of the thigh and leg muscles and to improve motion. Often times heat used for twenty to thirty minutes before working out will loosen up your tissues and help with improving the range of motion but do not use heat for the first two weeks following surgery (sometimes heat can increase post-operative swelling).   These exercises can be done on a training (exercise) mat, on the floor, on a table or on a bed. Use whatever works the best and is most comfortable for you.    Use music or television while you are exercising so that the exercises are a pleasant break in your day. This will make your life better with the exercises acting as a break in your routine that you can look forward to.   Perform all exercises about fifteen times, three times per day or as directed.  You should exercise both the operative leg and the other leg as well.   Exercises include:   Quad Sets - Tighten up the muscle on the front of the thigh (Quad) and hold for 5-10 seconds.   Straight Leg Raises - With your knee straight (if you were given a brace, keep it on), lift the leg to 60 degrees, hold for 3 seconds, and slowly lower the leg.  Perform this exercise against resistance later as your leg gets stronger.  Leg Slides: Lying on your back, slowly slide your foot toward your buttocks, bending your knee up off the floor (only go as far as is comfortable). Then slowly slide your foot back down until your leg is flat on the floor again.  Angel Wings:  Lying on your back spread your legs to the side as far apart as you can without causing discomfort.  Hamstring Strength:  Lying on your back, push your heel against the floor with your leg straight  by tightening up the muscles of your buttocks.  Repeat, but this time bend your knee to a comfortable angle, and push your heel against the floor.  You may put a pillow under the heel to make it more comfortable if necessary.   A rehabilitation program following joint replacement surgery can speed recovery and prevent re-injury in the future due to weakened muscles. Contact your doctor or a physical therapist for more information on knee rehabilitation.    CONSTIPATION  Constipation is defined medically as fewer than three stools per week and severe constipation as less than one stool per week.  Even if you have a regular bowel pattern at home, your normal regimen is likely to be disrupted due to multiple reasons following surgery.  Combination of anesthesia, postoperative narcotics, change in appetite and fluid intake all can affect your bowels.   YOU MUST use at least one of the following options; they are listed in order of increasing strength to get the job done.  They are all available over the counter, and you may need to use some, POSSIBLY even all of these options:    Drink plenty of fluids (prune juice may be helpful) and high fiber foods Colace 100 mg by mouth twice a day  Senokot for constipation as directed and as needed Dulcolax (bisacodyl), take with full glass of water  Miralax (polyethylene glycol) once or twice a day as needed.  If you have tried all these things and are unable to have a bowel movement in the first 3-4 days after surgery call either your surgeon or your primary doctor.    If you experience loose stools or diarrhea, hold the medications until you stool forms back up.  If your symptoms do not get better within 1 week or if they get worse, check with your doctor.  If you experience "the worst abdominal pain ever" or develop nausea or vomiting, please contact the office immediately for further recommendations for treatment.   ITCHING:  If you experience itching with  your medications, try taking only a single pain pill, or even half a pain pill at a time.  You can also use Benadryl over the counter for itching or also to help with sleep.   TED HOSE STOCKINGS:  Use stockings on both legs until for at least 2 weeks or as directed by physician office. They may be removed at night for sleeping.  MEDICATIONS:  See your medication summary on the "After Visit Summary" that nursing will review with you.  You may have some home medications which will be placed on hold until you complete the course of blood thinner medication.  It is important for you to complete the blood thinner medication as prescribed.  PRECAUTIONS:  If you experience chest pain or shortness of breath - call 911 immediately for transfer to the hospital emergency department.   If you develop a fever greater that 101 F, purulent drainage from wound, increased redness or drainage from wound, foul odor from the wound/dressing, or calf pain - CONTACT YOUR SURGEON.  FOLLOW-UP APPOINTMENTS:  If you do not already have a post-op appointment, please call the office for an appointment to be seen by your surgeon.  Guidelines for how soon to be seen are listed in your "After Visit Summary", but are typically between 1-4 weeks after surgery.  OTHER INSTRUCTIONS:   Knee Replacement:  Do not place pillow under knee, focus on keeping the knee straight while resting. CPM instructions: 0-90 degrees, 2 hours in the morning, 2 hours in the afternoon, and 2 hours in the evening. Place foam block, curve side up under heel at all times except when in CPM or when walking.  DO NOT modify, tear, cut, or change the foam block in any way.  MAKE SURE YOU:  Understand these instructions.  Get help right away if you are not doing well or get worse.    Thank you for letting us be a part of your medical care team.  It is a privilege we respect greatly.  We hope these instructions  will help you stay on track for a fast and full recovery!   Increase activity slowly as tolerated   Complete by:  As directed       Follow-up Information    Melrose Nakayama, MD. Go on 12/01/2018.   Specialty:  Orthopedic Surgery Why:  Your follow up appointment is at 9:15 am. Contact information: [address] [place] [address] [phone]        [facility]. Go on 11/30/2018.   Why:  You are scheduled for Outpatient Physical Therapy at 10:45 am. Please arrive promptly to complete all paperwork prior to your appointment. Contact information: [employer], [place]       [facility] Follow up.   Specialty:  [place] Why:  You will have 5 HH PT visits prior to your physician follow-up. Contact information: [address] [place] [address] [phone]        Melrose Nakayama, MD. Schedule an appointment as soon as possible for a visit in 2 weeks.   Specialty:  Orthopedic Surgery Contact information: [place] [address] [phone]            Signed: Larwance Sachs Nisha Dhami 11/23/2018, 6:58 AM

## 2018-11-23 NOTE — Progress Notes (Signed)
Physical Therapy Treatment Patient Details Name: Stacy Duncan MRN: [id number] DOB: 06/05/1951 Today's Date: 11/23/2018    History of Present Illness 68 y.o. female, has a history of pain and functional disability in the right knee due to arthritis. S/p R TKA 11/21/18. PMH: HTN    PT Comments    Pt is making good progress towards her goals despite increased pain and stiffness in her R knee today. Pt currently supervision for bed mobility, min guard for transfers, ambulation of 280 feet and ascent/descent of 2 steps with RW. Pt educated on technique for entering and exiting car. D/c plan remains appropriate at this time. Pt plans for d/c this afternoon.        Follow Up Recommendations  Follow surgeon's recommendation for DC plan and follow-up therapies     Equipment Recommendations  Rolling walker with 5" wheels;3in1 (PT)    Recommendations for Other Services       Precautions / Restrictions Precautions Precautions: Fall Restrictions Weight Bearing Restrictions: Yes RLE Weight Bearing: Weight bearing as tolerated    Mobility  Bed Mobility Overal bed mobility: Needs Assistance Bed Mobility: Supine to Sit     Supine to sit: Supervision     General bed mobility comments: supervision for safety  Transfers Overall transfer level: Needs assistance Equipment used: Rolling walker (2 wheeled) Transfers: Sit to/from Stand Sit to Stand: Min guard         General transfer comment: min guard for power up due to increased stiffness in L knee  Ambulation/Gait Ambulation/Gait assistance: Min guard Gait Distance (Feet): 280 Feet Assistive device: Rolling walker (2 wheeled) Gait Pattern/deviations: Decreased step length - right;Decreased step length - left;Decreased weight shift to right;Antalgic;Trunk flexed;Step-through pattern Gait velocity: slowed Gait velocity interpretation: <1.8 ft/sec, indicate of risk for recurrent falls General Gait Details: min guard for steadying,  vc for proximity to RW, and upright posture   Stairs Stairs: Yes Stairs assistance: Min guard Stair Management: Backwards;No rails;With walker;Forwards Number of Stairs: 2 General stair comments: min guard for safety, backward ascent and forward descent of 2 steps. vc for sequencing and hand placement for powerup      Balance Overall balance assessment: Needs assistance Sitting-balance support: Feet supported;No upper extremity supported Sitting balance-Leahy Scale: Good     Standing balance support: Bilateral upper extremity supported Standing balance-Leahy Scale: Fair Standing balance comment: able to steady in standing before reaching to [employer] Arousal/Alertness: Awake/alert Behavior During Therapy: [facility] for tasks assessed/performed Overall Cognitive Status: Within Functional Limits for tasks assessed                                        Exercises Total Joint Exercises Long Arc Quad: AROM;Both;10 reps;Seated Knee Flexion: AROM;Both;10 reps;Seated    General Comments General comments (skin integrity, edema, etc.): Educated on proper technique for transferring in and out of car      Pertinent Vitals/Pain Pain Location: R knee Pain Descriptors / Indicators: Aching;Sore;Throbbing           PT Goals (current goals can now be found in the care plan section) Acute Rehab PT Goals Patient Stated Goal: be able to walk her dog PT Goal Formulation: With patient Time For Goal Achievement: 12/06/18 Potential to Achieve Goals:  Good Progress towards PT goals: Progressing toward goals    Frequency    7X/week      PT Plan Current plan remains appropriate    Co-evaluation              AM-PAC PT "6 Clicks" Mobility   Outcome Measure  Help needed turning from your back to your side while in a flat bed without using bedrails?: A Little Help needed moving from lying on your back to sitting on the side  of a flat bed without using bedrails?: A Lot Help needed moving to and from a bed to a chair (including a wheelchair)?: A Little Help needed standing up from a chair using your arms (e.g., wheelchair or bedside chair)?: A Little Help needed to walk in hospital room?: A Little Help needed climbing 3-5 steps with a railing? : A Little 6 Click Score: 17    End of Session Equipment Utilized During Treatment: Gait belt Activity Tolerance: Patient limited by pain Patient left: in chair;with call bell/phone within reach;with chair alarm set Nurse Communication: Mobility status;Patient requests pain meds;Weight bearing status PT Visit Diagnosis: Unsteadiness on feet (R26.81);Other abnormalities of gait and mobility (R26.89);Muscle weakness (generalized) (M62.81);Difficulty in walking, not elsewhere classified (R26.2);Pain Pain - Right/Left: Right Pain - part of body: Knee     Time: 1104-1130 PT Time Calculation (min) (ACUTE ONLY): 26 min  Charges:  $Gait Training: 23-37 mins                     Leatrice Parilla B. Migdalia Dk PT, DPT Acute Rehabilitation Services Pager [phone] Office [phone]    [place] 11/23/2018, 11:57 AM

## 2018-11-23 NOTE — Progress Notes (Signed)
Patient discharging home. Discharge instructions explained to patient and she verbalized understanding. Took all personal belongings. No further questions or concerns voiced.  

## 2018-11-23 NOTE — Progress Notes (Signed)
Subjective: 2 Days Post-Op Procedure(s) (LRB): TOTAL KNEE ARTHROPLASTY (Right)   Patient feels well and is hoping to go home this morning.  Activity level:  wbat Diet tolerance:  ok Voiding:  ok Patient reports pain as mild.    Objective: Vital signs in last 24 hours: Temp:  [97.9 F (36.6 C)-99.8 F (37.7 C)] 97.9 F (36.6 C) (01/30 0346) Pulse Rate:  [67-74] 67 (01/30 0346) Resp:  [18-20] 20 (01/30 0346) BP: (123-155)/(56-91) 123/91 (01/30 0346) SpO2:  [96 %-98 %] 98 % (01/30 0346)  Labs: Recent Labs    11/22/18 0337 11/23/18 0247  HGB 11.9* 11.1*   Recent Labs    11/22/18 0337 11/23/18 0247  WBC 13.9* 11.0*  RBC 4.06 3.89  HCT 34.8* 33.1*  PLT 227 212   Recent Labs    11/22/18 0337  NA 137  K 3.4*  CL 101  CO2 25  BUN 12  CREATININE 0.81  GLUCOSE 127*  CALCIUM 8.4*   No results for input(s): LABPT, INR in the last 72 hours.  Physical Exam:  Neurologically intact ABD soft Neurovascular intact Sensation intact distally Intact pulses distally Dorsiflexion/Plantar flexion intact Incision: dressing C/D/I and no drainage No cellulitis present Compartment soft  Assessment/Plan:  2 Days Post-Op Procedure(s) (LRB): TOTAL KNEE ARTHROPLASTY (Right) Advance diet Up with therapy Discharge home with home health today after PT. Continue on ASA 325mg  BID x 2 weeks post op. Follow up in office 2 weeks post op. Anticipated LOS equal to or greater than 2 midnights due to - Age 69 and older with one or more of the following:  - Obesity  - Expected need for hospital services (PT, OT, Nursing) required for safe  discharge  - Anticipated need for postoperative skilled nursing care or inpatient rehab  - Active co-morbidities: None OR   - Unanticipated findings during/Post Surgery: Slow post-op progression: GI, pain control, mobility  - Patient is a high risk of re-admission due to: None  [place] 11/23/2018, 6:52 AM

## 2018-11-23 NOTE — Plan of Care (Signed)
  Problem: Activity: Goal: Risk for activity intolerance will decrease Outcome: Progressing   Problem: Nutrition: Goal: Adequate nutrition will be maintained Outcome: Progressing   Problem: Pain Managment: Goal: General experience of comfort will improve Outcome: Progressing   Problem: Safety: Goal: Ability to remain free from injury will improve Outcome: Progressing   Problem: Skin Integrity: Goal: Risk for impaired skin integrity will decrease Outcome: Progressing   

## 2018-11-23 NOTE — Plan of Care (Signed)

## 2018-11-30 ENCOUNTER — Ambulatory Visit: Payer: Medicare Other | Attending: Orthopaedic Surgery | Admitting: Physical Therapy

## 2018-11-30 ENCOUNTER — Other Ambulatory Visit: Payer: Self-pay

## 2018-11-30 ENCOUNTER — Encounter: Payer: Self-pay | Admitting: Physical Therapy

## 2018-11-30 DIAGNOSIS — M25561 Pain in right knee: Secondary | ICD-10-CM | POA: Diagnosis not present

## 2018-11-30 DIAGNOSIS — M6281 Muscle weakness (generalized): Secondary | ICD-10-CM

## 2018-11-30 DIAGNOSIS — R262 Difficulty in walking, not elsewhere classified: Secondary | ICD-10-CM | POA: Diagnosis present

## 2018-11-30 DIAGNOSIS — R2689 Other abnormalities of gait and mobility: Secondary | ICD-10-CM | POA: Diagnosis present

## 2018-11-30 DIAGNOSIS — M25661 Stiffness of right knee, not elsewhere classified: Secondary | ICD-10-CM | POA: Diagnosis present

## 2018-11-30 NOTE — Therapy (Signed)
[facility] [address]  [place], [place], [address] Phone: [phone]   Fax:  [phone]  Physical Therapy Evaluation  Patient Details  Name: Stacy Duncan MRN: [id number] Date of Birth: 1951-05-29 Referring Provider (PT): Melrose Nakayama, MD   Encounter Date: 11/30/2018  PT End of Session - 11/30/18 1140    Visit Number  1    Number of Visits  17    Date for PT Re-Evaluation  01/25/19    Authorization Type  Medicare & Federal BCBS    PT Start Time  1055    PT Stop Time  1133    PT Time Calculation (min)  38 min    Activity Tolerance  Patient tolerated treatment well;Patient limited by pain    Behavior During Therapy  [facility] for tasks assessed/performed       Past Medical History:  Diagnosis Date  . Cataract   . Depression   . Hypercholesteremia   . Hypertension     Past Surgical History:  Procedure Laterality Date  . ANKLE SURGERY Right   . BREAST LUMPECTOMY Left    mid 69s  . KNEE SURGERY Left   . TOTAL KNEE ARTHROPLASTY Right 11/21/2018   Procedure: TOTAL KNEE ARTHROPLASTY;  Surgeon: Melrose Nakayama, MD;  Location: [place];  Service: Orthopedics;  Laterality: Right;    There were no vitals filed for this visit.   Subjective Assessment - 11/30/18 1056    Subjective  Patient reports undergoing R TKA on 11/21/18. Was using RW at the hospital, now has transitioned to [facility]. Had HHPT for 5 days. Per patient- aware of surgical precautions such as no driving, soaking incision, no lotions. Pain levels have been 7/10 on average, diffusely over knee. Worse with prolonged sitting, squatting to floor, being on her feet for a long time, stairs. Better with meds and ice. Scheduled for F/U with surgeon tomorrow.     Pertinent History  HTN, HLD, depression, cataract, L knee surgery, L breast lumpectomy, R ankle surgery    Limitations  Sitting;Lifting;Standing;Walking;House hold activities    How long can you sit  comfortably?  30 min    How long can you stand comfortably?  20 min    How long can you walk comfortably?  20 min    Diagnostic tests  none recent    Patient Stated Goals  "get my balance and my bend back in my knee"    Currently in Pain?  Yes    Pain Score  8     Pain Location  Knee    Pain Orientation  Right;Anterior    Pain Type  Acute pain;Surgical pain         [facility] PT Assessment - 11/30/18 1108      Assessment   Medical Diagnosis  s/p R TKA    Referring Provider (PT)  Melrose Nakayama, MD    Onset Date/Surgical Date  11/21/18    Next MD Visit  12/01/18    Prior Therapy  Yes- for R knee      Precautions   Precautions  None      Restrictions   Weight Bearing Restrictions  No      Balance Screen   Has the patient fallen in the past 6 months  No    Has the patient had a decrease in activity level because of a fear of falling?   No    Is the patient reluctant to leave their home because  of a fear of falling?   No      Home Film/video editor residence    Living Arrangements  --   roommate   Available Help at Discharge  Friend(s)    Type of [place] to enter    Entrance Stairs-Number of Steps  3    [place]  One level    [place] - 2 wheels;Cane - single point      Prior Function   Level of Independence  Independent with basic ADLs   gets help with cooking & cleaning from brother   Vocation  Retired    Leisure  yard work & gardening      Cognition   Overall Cognitive Status  Within Functional Limits for tasks assessed      Observation/Other Assessments   Observations  mild diffuse swelling over R knee; no redness or swelling at calf    Focus on Therapeutic Outcomes (FOTO)   Knee: 40 (60% limited, 42% predicted)      Sensation   Light Touch  Appears Intact   occasional N/T in L hand tips of fingers     Coordination   Gross Motor Movements are Fluid and  Coordinated  Yes      Posture/Postural Control   Posture/Postural Control  Postural limitations    Postural Limitations  Rounded Shoulders;Forward head;Posterior pelvic tilt      ROM / Strength   AROM / PROM / Strength  AROM;PROM;Strength      AROM   AROM Assessment Site  Knee    Right/Left Knee  Right;Left    Right Knee Extension  8    Right Knee Flexion  70   pain   Left Knee Extension  1    Left Knee Flexion  120      PROM   PROM Assessment Site  Knee    Right/Left Knee  Right;Left    Right Knee Extension  6    Right Knee Flexion  74   pain   Left Knee Extension  0    Left Knee Flexion  125   L knee pain     Strength   Strength Assessment Site  Hip;Knee;Ankle    Right/Left Hip  Right;Left    Right Hip Flexion  4-/5    Right Hip ABduction  4/5    Right Hip ADduction  4/5    Left Hip Flexion  4/5    Left Hip ABduction  4/5    Left Hip ADduction  4/5    Right/Left Knee  Right;Left    Right Knee Flexion  3/5    Right Knee Extension  3/5    Left Knee Flexion  4+/5    Left Knee Extension  4/5    Right/Left Ankle  Right;Left    Right Ankle Dorsiflexion  4/5    Right Ankle Plantar Flexion  4/5    Left Ankle Dorsiflexion  4/5    Left Ankle Plantar Flexion  4/5      Palpation   Patella mobility  unable to assess d/t dressing over incision    Palpation comment  mildly TTP in R distal calf and lateral knee      Ambulation/Gait   Assistive device  Straight cane    Gait Pattern  Step-to pattern;Step-through pattern;Decreased step length - left;Decreased stance time - right;Decreased hip/knee flexion -  left;Decreased weight shift to left    Ambulation Surface  Level;Indoor    Gait velocity  decreased                Objective measurements completed on examination: See above findings.              PT Education - 11/30/18 1139    Education Details  prognosis, POC, HEP, edu on DVT red flags and to seek immediate medical attention if symptoms arise      Person(s) Educated  Patient    Methods  Explanation;Demonstration;Tactile cues;Verbal cues;Handout    Comprehension  Verbalized understanding;Returned demonstration       PT Short Term Goals - 11/30/18 1312      PT SHORT TERM GOAL #1   Title  Patient to be independent with initial HEP.    Time  4    Period  Weeks    Status  New    Target Date  12/28/18        PT Long Term Goals - 11/30/18 1312      PT LONG TERM GOAL #1   Title  Patient to be independent with advanced HEP.    Time  8    Period  Weeks    Status  New    Target Date  12/28/18      PT LONG TERM GOAL #2   Title  Patient to demonstrate R knee AROM 0-120 degrees.     Time  8    Period  Weeks    Status  New    Target Date  01/25/19      PT LONG TERM GOAL #3   Title  Patient to demonstrate B LE strength >=4+/5.     Time  8    Period  Weeks    Status  New    Target Date  01/25/19      PT LONG TERM GOAL #4   Title  Patient to demonstrate symmetrical strep length, weight shift, and knee flexion with LRAD.    Time  8    Period  Weeks    Status  New    Target Date  01/25/19      PT LONG TERM GOAL #5   Title  Pt will report overall improvement in B knee pain by 90%.    Time  8    Period  Weeks    Status  New    Target Date  01/25/19             Plan - 11/30/18 1310    Clinical Impression Statement  Patient is a 67y/o F presenting to OPPT with c/o R knee pain s/p R TKA on 11/21/18. Had HHPT x5 days, at which time she transitioned from RW to [facility]. Patient aware and compliant with surgical precautions. Reports diffuse 7/10 R knee pain on average, worse with prolonged sitting, squatting to floor, being on her feet for a long time, stairs. Patient today with limited and painful R knee ROM, decreased B LE strength, gait deviations, and mild distal R calf tenderness without warmth, significant edema, or redness. R knee incision still covered with dressing and with ted hose. Educated patient on gentle ROM  and strengthening HEP as well as DVT red flags. Instructed to present to ED if symptoms arise. Patient reported understanding. Would benefit from skilled PT services 2x/week for 8 weeks to address aforementioned impairments.     Clinical Presentation  Stable    Clinical Decision  Making  Low    Rehab Potential  Good    Clinical Impairments Affecting Rehab Potential  HTN, HLD, depression, cataract, L knee surgery, L breast lumpectomy, R ankle surgery    PT Frequency  2x / week    PT Duration  8 weeks    PT Treatment/Interventions  ADLs/Self Care Home Management;Cryotherapy;Electrical Stimulation;Functional mobility training;Stair training;Gait training;DME Instruction;Ultrasound;Moist Heat;Therapeutic activities;Therapeutic exercise;Balance training;Neuromuscular re-education;Patient/family education;Orthotic Fit/Training;Passive range of motion;Scar mobilization;Manual techniques;Dry needling;Energy conservation;Splinting;Taping;Vasopneumatic Device    PT Next Visit Plan  reassess HEP    Consulted and Agree with Plan of Care  Patient       Patient will benefit from skilled therapeutic intervention in order to improve the following deficits and impairments:  Abnormal gait, Decreased endurance, Hypomobility, Increased edema, Decreased scar mobility, Decreased activity tolerance, Decreased strength, Pain, Difficulty walking, Decreased balance, Decreased range of motion, Postural dysfunction, Impaired flexibility, Improper body mechanics  Visit Diagnosis: Acute pain of right knee  Stiffness of right knee, not elsewhere classified  Muscle weakness (generalized)  Difficulty in walking, not elsewhere classified     Problem List Patient Active Problem List   Diagnosis Date Noted  . Primary localized osteoarthritis of right knee 11/21/2018  . Primary osteoarthritis of right knee 11/21/2018  . Personal history of colonic adenoma 08/08/2013  . Family history of colon cancer 08/08/2013     Janene Harvey, PT, DPT 11/30/18 2:16 PM   [facility] [address]  [place], [place], [address] Phone: [phone]   Fax:  [phone]  Name: Annagrace Carr MRN: [id number] Date of Birth: December 31, 1950

## 2018-12-05 ENCOUNTER — Ambulatory Visit: Payer: Medicare Other

## 2018-12-05 DIAGNOSIS — M25561 Pain in right knee: Secondary | ICD-10-CM | POA: Diagnosis not present

## 2018-12-05 DIAGNOSIS — M6281 Muscle weakness (generalized): Secondary | ICD-10-CM

## 2018-12-05 DIAGNOSIS — M25661 Stiffness of right knee, not elsewhere classified: Secondary | ICD-10-CM

## 2018-12-05 DIAGNOSIS — R262 Difficulty in walking, not elsewhere classified: Secondary | ICD-10-CM

## 2018-12-05 NOTE — Therapy (Signed)
[facility] [address]  [place], [place], [address] Phone: [phone]   Fax:  [phone]  Physical Therapy Treatment  Patient Details  Name: Stacy Duncan MRN: [id number] Date of Birth: 05-14-1951 Referring Provider (PT): Melrose Nakayama, MD   Encounter Date: 12/05/2018  PT End of Session - 12/05/18 1324    Visit Number  2    Number of Visits  17    Date for PT Re-Evaluation  01/25/19    Authorization Type  Medicare & Federal BCBS    PT Start Time  1324    PT Stop Time  1405    PT Time Calculation (min)  48 min    Activity Tolerance  Patient tolerated treatment well;Patient limited by pain    Behavior During Therapy  [facility] for tasks assessed/performed       Past Medical History:  Diagnosis Date  . Cataract   . Depression   . Hypercholesteremia   . Hypertension     Past Surgical History:  Procedure Laterality Date  . ANKLE SURGERY Right   . BREAST LUMPECTOMY Left    mid 54s  . KNEE SURGERY Left   . TOTAL KNEE ARTHROPLASTY Right 11/21/2018   Procedure: TOTAL KNEE ARTHROPLASTY;  Surgeon: Melrose Nakayama, MD;  Location: [place];  Service: Orthopedics;  Laterality: Right;    There were no vitals filed for this visit.  Subjective Assessment - 12/05/18 1323    Subjective  Pt. reporting she has been performing HEP.      Pertinent History  HTN, HLD, depression, cataract, L knee surgery, L breast lumpectomy, R ankle surgery    Patient Stated Goals  "get my balance and my bend back in my knee"    Currently in Pain?  Yes    Pain Score  6     Pain Location  Knee    Pain Orientation  Right;Anterior    Pain Descriptors / Indicators  Aching;Discomfort    Pain Type  Acute pain;Surgical pain    Multiple Pain Sites  No                       [facility] Adult PT Treatment/Exercise - 12/05/18 1339      Knee/Hip Exercises: Stretches   Hip Flexor Stretch  Right;1 rep;60 seconds    Hip Flexor Stretch  Limitations  strap in mod thoms position       Knee/Hip Exercises: Standing   Heel Raises  Both;15 reps    Heel Raises Limitations  chair support     Other Standing Knee Exercises  R/L wt. shift with LE clearance x 10 reps each chair       Knee/Hip Exercises: Seated   Sit to Sand  5 reps;with UE support   from mat table working on even wt. bearing      Knee/Hip Exercises: Supine   Quad Sets  Right;10 reps    Quad Sets Limitations  with pillow squeeze behind knee    Short Arc Quad Sets  Right;10 reps;Strengthening    Short Arc Quad Sets Limitations  bolster under knee     Heel Slides  Right;10 reps;AAROM    Heel Slides Limitations  strap assist and pillow case under foot     Straight Leg Raises  Right;10 reps;Strengthening    Straight Leg Raises Limitations  Cues for proper distance of movement     Knee Flexion  Right;AROM;10 reps    Knee Flexion  Limitations  B knee flexion HS curls with heels on peanut p-ball       Modalities   Modalities  Vasopneumatic      Vasopneumatic   Number Minutes Vasopneumatic   10 minutes    Vasopnuematic Location   Knee   R   Vasopneumatic Pressure  Low    Vasopneumatic Temperature   coldest temp.                 PT Short Term Goals - 12/05/18 1325      PT SHORT TERM GOAL #1   Title  Patient to be independent with initial HEP.    Time  4    Period  Weeks    Status  On-going    Target Date  12/28/18        PT Long Term Goals - 12/05/18 1325      PT LONG TERM GOAL #1   Title  Patient to be independent with advanced HEP.    Time  8    Period  Weeks    Status  On-going      PT LONG TERM GOAL #2   Title  Patient to demonstrate R knee AROM 0-120 degrees.     Time  8    Period  Weeks    Status  On-going      PT LONG TERM GOAL #3   Title  Patient to demonstrate B LE strength >=4+/5.     Time  8    Period  Weeks    Status  On-going      PT LONG TERM GOAL #4   Title  Patient to demonstrate symmetrical strep length, weight  shift, and knee flexion with LRAD.    Time  8    Period  Weeks    Status  On-going      PT LONG TERM GOAL #5   Title  Pt will report overall improvement in B knee pain by 90%.    Time  8    Period  Weeks    Status  On-going            Plan - 12/05/18 1331    Clinical Impression Statement  Stacy Duncan doing well.  Reports she has been performing HEP.  With HEP review, only mild cueing required to guide pt. in proper SLR motion.  Tolerated all gentle supine and standing ROM and strengthening activities well today without significant rise in pain.  Ended visit with ice/compression to R LE to reduce post-exercise swelling and pain.  Will monitor tolerance to therex progress in coming visit.      Clinical Impairments Affecting Rehab Potential  HTN, HLD, depression, cataract, L knee surgery, L breast lumpectomy, R ankle surgery    PT Treatment/Interventions  ADLs/Self Care Home Management;Cryotherapy;Electrical Stimulation;Functional mobility training;Stair training;Gait training;DME Instruction;Ultrasound;Moist Heat;Therapeutic activities;Therapeutic exercise;Balance training;Neuromuscular re-education;Patient/family education;Orthotic Fit/Training;Passive range of motion;Scar mobilization;Manual techniques;Dry needling;Energy conservation;Splinting;Taping;Vasopneumatic Device    PT Next Visit Plan  Monitor tolerance to progression of therex; progress to standing activities as able; wean from [facility] as able    Consulted and Agree with Plan of Care  Patient       Patient will benefit from skilled therapeutic intervention in order to improve the following deficits and impairments:  Abnormal gait, Decreased endurance, Hypomobility, Increased edema, Decreased scar mobility, Decreased activity tolerance, Decreased strength, Pain, Difficulty walking, Decreased balance, Decreased range of motion, Postural dysfunction, Impaired flexibility, Improper body mechanics  Visit Diagnosis: Acute pain of right  knee  Stiffness of right knee, not elsewhere classified  Muscle weakness (generalized)  Difficulty in walking, not elsewhere classified     Problem List Patient Active Problem List   Diagnosis Date Noted  . Primary localized osteoarthritis of right knee 11/21/2018  . Primary osteoarthritis of right knee 11/21/2018  . Personal history of colonic adenoma 08/08/2013  . Family history of colon cancer 08/08/2013    Bess Harvest, PTA 12/05/18 3:21 PM   [facility] [address]  [place], [place], [address] Phone: [phone]   Fax:  [phone]  Name: Stacy Duncan MRN: [id number] Date of Birth: 01-04-1951

## 2018-12-07 ENCOUNTER — Encounter: Payer: Self-pay | Admitting: Physical Therapy

## 2018-12-07 ENCOUNTER — Ambulatory Visit: Payer: Medicare Other | Admitting: Physical Therapy

## 2018-12-07 DIAGNOSIS — M25561 Pain in right knee: Secondary | ICD-10-CM

## 2018-12-07 DIAGNOSIS — M6281 Muscle weakness (generalized): Secondary | ICD-10-CM

## 2018-12-07 DIAGNOSIS — R262 Difficulty in walking, not elsewhere classified: Secondary | ICD-10-CM

## 2018-12-07 DIAGNOSIS — M25661 Stiffness of right knee, not elsewhere classified: Secondary | ICD-10-CM

## 2018-12-07 NOTE — Therapy (Signed)
[facility] [address]  [place], [place], [address] Phone: [phone]   Fax:  [phone]  Physical Therapy Treatment  Patient Details  Name: Stacy Duncan MRN: [id number] Date of Birth: 10/11/1951 Referring Provider (PT): Melrose Nakayama, MD   Encounter Date: 12/07/2018  PT End of Session - 12/07/18 1051    Visit Number  3    Number of Visits  17    Date for PT Re-Evaluation  01/25/19    Authorization Type  Medicare & Federal BCBS    PT Start Time  3545    PT Stop Time  1059    PT Time Calculation (min)  44 min    Activity Tolerance  Patient tolerated treatment well    Behavior During Therapy  [facility] for tasks assessed/performed       Past Medical History:  Diagnosis Date  . Cataract   . Depression   . Hypercholesteremia   . Hypertension     Past Surgical History:  Procedure Laterality Date  . ANKLE SURGERY Right   . BREAST LUMPECTOMY Left    mid 87s  . KNEE SURGERY Left   . TOTAL KNEE ARTHROPLASTY Right 11/21/2018   Procedure: TOTAL KNEE ARTHROPLASTY;  Surgeon: Melrose Nakayama, MD;  Location: [place];  Service: Orthopedics;  Laterality: Right;    There were no vitals filed for this visit.  Subjective Assessment - 12/07/18 1017    Subjective  Reports everything is going well and is compliant with HEP as well as some extra.     Pertinent History  HTN, HLD, depression, cataract, L knee surgery, L breast lumpectomy, R ankle surgery    Diagnostic tests  none recent    Patient Stated Goals  "get my balance and my bend back in my knee"    Currently in Pain?  Yes    Pain Score  5     Pain Location  Knee    Pain Orientation  Right    Pain Descriptors / Indicators  Aching;Discomfort    Pain Type  Acute pain;Surgical pain    Multiple Pain Sites  Yes    Pain Score  5    Pain Location  Knee    Pain Orientation  Left    Pain Descriptors / Indicators  Sharp    Pain Type  Chronic pain                        [facility] Adult PT Treatment/Exercise - 12/07/18 0001      Knee/Hip Exercises: Stretches   Passive Hamstring Stretch  Right;2 reps;30 seconds    Passive Hamstring Stretch Limitations  supine; strap    Gastroc Stretch  Right;2 reps;30 seconds    Gastroc Stretch Limitations  long sitting with strap      Knee/Hip Exercises: Aerobic   Nustep  L1 x 60min UE/LEs       Knee/Hip Exercises: Standing   Heel Raises  Both;15 reps    Heel Raises Limitations  counter top support   cues for equal wt shift   Functional Squat  1 set;10 reps    Functional Squat Limitations  mini squat at counter top   cues to bring bottom back   Other Standing Knee Exercises  R LE weight shift at counter top 10x5"   cues for TKE on R LE     Knee/Hip Exercises: Seated   Long Arc Quad  Strengthening;Right;1 set;10 reps  Long [employer] Limitations  cues for slow eccentric lower and TKE    Sit to [employer]  5 reps;without UE support;2 sets   sitting on foam pad; good carryover of cues for trunk lean     Knee/Hip Exercises: Supine   Heel Slides  Right;10 reps;AAROM    Heel Slides Limitations  10x3" with strap and orange pball      Vasopneumatic   Number Minutes Vasopneumatic   10 minutes    Vasopnuematic Location   Knee   R   Vasopneumatic Pressure  Low    Vasopneumatic Temperature   coldest temp.               PT Education - 12/07/18 1050    Education Details  update to HEP    Person(s) Educated  Patient    Methods  Explanation;Demonstration;Tactile cues;Verbal cues;Handout    Comprehension  Verbalized understanding;Returned demonstration       PT Short Term Goals - 12/07/18 1055      PT SHORT TERM GOAL #1   Title  Patient to be independent with initial HEP.    Time  4    Period  Weeks    Status  Achieved    Target Date  12/28/18        PT Long Term Goals - 12/05/18 1325      PT LONG TERM GOAL #1   Title  Patient to be independent with advanced HEP.    Time   8    Period  Weeks    Status  On-going      PT LONG TERM GOAL #2   Title  Patient to demonstrate R knee AROM 0-120 degrees.     Time  8    Period  Weeks    Status  On-going      PT LONG TERM GOAL #3   Title  Patient to demonstrate B LE strength >=4+/5.     Time  8    Period  Weeks    Status  On-going      PT LONG TERM GOAL #4   Title  Patient to demonstrate symmetrical strep length, weight shift, and knee flexion with LRAD.    Time  8    Period  Weeks    Status  On-going      PT LONG TERM GOAL #5   Title  Pt will report overall improvement in B knee pain by 90%.    Time  8    Period  Weeks    Status  On-going            Plan - 12/07/18 1051    Clinical Impression Statement  Patient arrived to session with no new complaints. Worked on standing LE strengthening today with cues for R sided weight shift and equal weight distribution. Patient with overall good form and carryover of cues. Introduced mini squat with counter support with cues to avoid anterior translation over tibias. Progressed STS today with patient sitting on foam pad and without UE support. Slight difficulty with eccentric control on lowering down to sit. Patient with palpable R distal HS tightness and mild calf tenderness which was addressed with HS and gastroc stretching. R knee still in ted hose but with only mild swelling. Patient aware of DVT red flags to watch for. Ended session with [place] to R knee for post-exercise soreness. No complaints at end of session.     Clinical Impairments Affecting Rehab Potential  HTN, HLD, depression, cataract,  L knee surgery, L breast lumpectomy, R ankle surgery    PT Treatment/Interventions  ADLs/Self Care Home Management;Cryotherapy;Electrical Stimulation;Functional mobility training;Stair training;Gait training;DME Instruction;Ultrasound;Moist Heat;Therapeutic activities;Therapeutic exercise;Balance training;Neuromuscular re-education;Patient/family education;Orthotic  Fit/Training;Passive range of motion;Scar mobilization;Manual techniques;Dry needling;Energy conservation;Splinting;Taping;Vasopneumatic Device    PT Next Visit Plan  Monitor tolerance to progression of therex; progress to standing activities as able; wean from [facility] as able    Consulted and Agree with Plan of Care  Patient       Patient will benefit from skilled therapeutic intervention in order to improve the following deficits and impairments:  Abnormal gait, Decreased endurance, Hypomobility, Increased edema, Decreased scar mobility, Decreased activity tolerance, Decreased strength, Pain, Difficulty walking, Decreased balance, Decreased range of motion, Postural dysfunction, Impaired flexibility, Improper body mechanics  Visit Diagnosis: Acute pain of right knee  Stiffness of right knee, not elsewhere classified  Muscle weakness (generalized)  Difficulty in walking, not elsewhere classified     Problem List Patient Active Problem List   Diagnosis Date Noted  . Primary localized osteoarthritis of right knee 11/21/2018  . Primary osteoarthritis of right knee 11/21/2018  . Personal history of colonic adenoma 08/08/2013  . Family history of colon cancer 08/08/2013     Janene Harvey, PT, DPT 12/07/18 11:01 AM   [facility] [address]  [place], [place], [address] Phone: [phone]   Fax:  [phone]  Name: Stacy Duncan MRN: [id number] Date of Birth: 01/28/1951

## 2018-12-11 ENCOUNTER — Encounter: Payer: Self-pay | Admitting: Physical Therapy

## 2018-12-11 ENCOUNTER — Ambulatory Visit: Payer: Medicare Other | Admitting: Physical Therapy

## 2018-12-11 DIAGNOSIS — R262 Difficulty in walking, not elsewhere classified: Secondary | ICD-10-CM

## 2018-12-11 DIAGNOSIS — M6281 Muscle weakness (generalized): Secondary | ICD-10-CM

## 2018-12-11 DIAGNOSIS — M25561 Pain in right knee: Secondary | ICD-10-CM | POA: Diagnosis not present

## 2018-12-11 DIAGNOSIS — M25661 Stiffness of right knee, not elsewhere classified: Secondary | ICD-10-CM

## 2018-12-11 NOTE — Therapy (Signed)
[facility] [address]  [place], [place], [address] Phone: [phone]   Fax:  [phone]  Physical Therapy Treatment  Patient Details  Name: Stacy Duncan MRN: [id number] Date of Birth: [birth year] Referring Provider (PT): Melrose Nakayama, MD   Encounter Date: 12/11/2018  PT End of Session - 12/11/18 1436    Visit Number  4    Number of Visits  17    Date for PT Re-Evaluation  01/25/19    Authorization Type  Medicare & Federal BCBS    PT Start Time  1400    PT Stop Time  1444    PT Time Calculation (min)  44 min    Activity Tolerance  Patient tolerated treatment well    Behavior During Therapy  [facility] for tasks assessed/performed       Past Medical History:  Diagnosis Date  . Cataract   . Depression   . Hypercholesteremia   . Hypertension     Past Surgical History:  Procedure Laterality Date  . ANKLE SURGERY Right   . BREAST LUMPECTOMY Left    mid 78s  . KNEE SURGERY Left   . 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  Subjective Assessment - 12/11/18 1401    Subjective  Reports that she is doing well and is now able to put her socks on. Has noticed some pain in L knee lately.     Pertinent History  HTN, HLD, depression, cataract, L knee surgery, L breast lumpectomy, R ankle surgery    Patient Stated Goals  "get my balance and my bend back in my knee"    Pain Score  4     Pain Location  Knee    Pain Orientation  Right    Pain Descriptors / Indicators  Aching;Discomfort    Pain Type  Acute pain;Surgical pain                       [facility] Adult PT Treatment/Exercise - 12/11/18 0001      Knee/Hip Exercises: Stretches   Passive Hamstring Stretch  Right;2 reps;30 seconds    Passive Hamstring Stretch Limitations   supine; strap    Hip Flexor Stretch  Right;2 reps;30 seconds    ITB Stretch  Right;2 reps;30 seconds    ITB Stretch Limitations  supine with strap      Knee/Hip Exercises: Aerobic   Nustep  L1 x 74min LEs only      Knee/Hip Exercises: Standing   Hip Abduction  Stengthening;Right;Left;1 set;10 reps;Knee straight    Abduction Limitations  at counter top    Hip Extension  Stengthening;Right;Left;1 set;10 reps;Knee straight    Extension Limitations  at counter top; cues to avoid anterior lean    Forward Step Up  Right;1 set;10 reps;Hand Hold: 1;Step Height: 4"    Forward Step Up Limitations  cues to avoid hip drop and encourage knee flexion    Functional Squat  1 set;10 reps    Functional Squat Limitations  mini squat at counter top   cues to hold onto counter and bend knees   Wall Squat  1 set;10 reps    Wall Squat Limitations  mini wall squat to tolerance      Knee/Hip Exercises: Supine   Bridges  Strengthening;Both;1  set;10 reps    Bridges Limitations  good form    Bridges with [employer]  Strengthening;Both;1 set;10 reps      Vasopneumatic   Number Minutes Vasopneumatic   10 minutes    Vasopnuematic Location   Knee   R   Vasopneumatic Pressure  Low    Vasopneumatic Temperature   coldest temp.                 PT Short Term Goals - 12/07/18 1055      PT SHORT TERM GOAL #1   Title  Patient to be independent with initial HEP.    Time  4    Period  Weeks    Status  Achieved    Target Date  12/28/18        PT Long Term Goals - 12/05/18 1325      PT LONG TERM GOAL #1   Title  Patient to be independent with advanced HEP.    Time  8    Period  Weeks    Status  On-going      PT LONG TERM GOAL #2   Title  Patient to demonstrate R knee AROM 0-120 degrees.     Time  8    Period  Weeks    Status  On-going      PT LONG TERM GOAL #3   Title  Patient to demonstrate B LE strength >=4+/5.     Time  8    Period  Weeks    Status  On-going      PT LONG TERM GOAL  #4   Title  Patient to demonstrate symmetrical strep length, weight shift, and knee flexion with LRAD.    Time  8    Period  Weeks    Status  On-going      PT LONG TERM GOAL #5   Title  Pt will report overall improvement in B knee pain by 90%.    Time  8    Period  Weeks    Status  On-going            Plan - 12/11/18 1436    Clinical Impression Statement  Patient arrived to session with report of being able to put her socks on, which she was not previously able to do. Began session with standing LE strengthening. Reviewed squats at counter top as patient with incorrect form to begin, but good carryover with cues. Able to tolerate introduction of standing hip strengthening with intermittent cues to maintain upright stance but overall good form. Introduced step ups with cues to avoid hip drop and encourage knee flexion as patient with this tendency. Introduced mini squats to tolerance with patient reporting mild pain in B knees. Ended session with Gameready to R knee for post-exercise soreness. No complaints at end of session.     Clinical Impairments Affecting Rehab Potential  HTN, HLD, depression, cataract, L knee surgery, L breast lumpectomy, R ankle surgery    PT Treatment/Interventions  ADLs/Self Care Home Management;Cryotherapy;Electrical Stimulation;Functional mobility training;Stair training;Gait training;DME Instruction;Ultrasound;Moist Heat;Therapeutic activities;Therapeutic exercise;Balance training;Neuromuscular re-education;Patient/family education;Orthotic Fit/Training;Passive range of motion;Scar mobilization;Manual techniques;Dry needling;Energy conservation;Splinting;Taping;Vasopneumatic Device    PT Next Visit Plan  MD F/U note on 02/20; progress to standing activities as able; [facility]an from [facility] as able    Consulted and Agree with Plan of Care  Patient       Patient will benefit from skilled therapeutic intervention in order to improve the following deficits and impairments:  Abnormal gait, Decreased endurance, Hypomobility, Increased edema, Decreased scar mobility, Decreased activity tolerance, Decreased strength, Pain, Difficulty walking, Decreased balance, Decreased range of motion, Postural dysfunction, Impaired flexibility, Improper body mechanics  Visit Diagnosis: Acute pain of right knee  Stiffness of right knee, not elsewhere classified  Muscle weakness (generalized)  Difficulty in walking, not elsewhere classified     Problem List Patient Active Problem List   Diagnosis Date Noted  . Primary localized osteoarthritis of right knee 11/21/2018  . Primary osteoarthritis of right knee 11/21/2018  . Personal history of colonic adenoma 08/08/2013  . Family history of colon cancer 08/08/2013    Janene Harvey, PT, DPT 12/11/18 2:53 PM    [facility] [address]  [place], [place], [address] Phone: [phone]   Fax:  [phone]  Name: Stacy Duncan MRN: [id number] Date of Birth: 05/30/1951

## 2018-12-13 ENCOUNTER — Ambulatory Visit: Payer: Medicare Other | Admitting: Physical Therapy

## 2018-12-13 ENCOUNTER — Encounter: Payer: Self-pay | Admitting: Physical Therapy

## 2018-12-13 DIAGNOSIS — M25561 Pain in right knee: Secondary | ICD-10-CM

## 2018-12-13 DIAGNOSIS — M6281 Muscle weakness (generalized): Secondary | ICD-10-CM

## 2018-12-13 DIAGNOSIS — R262 Difficulty in walking, not elsewhere classified: Secondary | ICD-10-CM

## 2018-12-13 DIAGNOSIS — M25661 Stiffness of right knee, not elsewhere classified: Secondary | ICD-10-CM

## 2018-12-13 NOTE — Therapy (Signed)
[facility] [address]  [place], [place], [address] Phone: [phone]   Fax:  [phone]  Physical Therapy Treatment  Patient Details  Name: Stacy Duncan MRN: [id number] Date of Birth: 02-19-1951 Referring Provider (PT): Melrose Nakayama, MD   Encounter Date: 12/13/2018  PT End of Session - 12/13/18 1057    Visit Number  5    Number of Visits  17    Date for PT Re-Evaluation  01/25/19    Authorization Type  Medicare & Federal BCBS    PT Start Time  1022    PT Stop Time  1102    PT Time Calculation (min)  40 min    Activity Tolerance  Patient tolerated treatment well;Patient limited by pain    Behavior During Therapy  [facility] for tasks assessed/performed       Past Medical History:  Diagnosis Date  . Cataract   . Depression   . Hypercholesteremia   . Hypertension     Past Surgical History:  Procedure Laterality Date  . ANKLE SURGERY Right   . BREAST LUMPECTOMY Left    mid 23s  . KNEE SURGERY Left   . 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  Subjective Assessment - 12/13/18 1024    Subjective  Reports that she got confused and thought her appointment was today. Not sure if her MD appointment is tomorow or Friday.     Pertinent History  HTN, HLD, depression, cataract, L knee surgery, L breast lumpectomy, R ankle surgery    Diagnostic tests  none recent    Patient Stated Goals  "get my balance and my bend back in my knee"    Currently in Pain?  Yes    Pain Score  5     Pain Location  Knee    Pain Orientation  Right    Pain Descriptors / Indicators  Aching;Discomfort    Pain Type  Acute pain;Surgical pain    Pain Score  4    Pain Location  Knee    Pain Orientation  Left    Pain Descriptors / Indicators  Sharp    Pain Type   Chronic pain         [facility] PT Assessment - 12/13/18 0001      AROM   AROM Assessment Site  Knee    Right/Left Knee  Right    Right Knee Extension  6    Right Knee Flexion  94      PROM   Right/Left Knee  Right    Right Knee Extension  5    Right Knee Flexion  96      Strength   Right Hip Flexion  4+/5    Right Hip ABduction  4+/5    Right Hip ADduction  4/5    Left Hip Flexion  4+/5    Left Hip ABduction  4+/5    Left Hip ADduction  4/5    Right Knee Flexion  3+/5    Right Knee Extension  3+/5    Left Knee Flexion  4+/5    Left Knee Extension  4/5    Right Ankle Dorsiflexion  4+/5    Right Ankle Plantar Flexion  4+/5    Left Ankle Dorsiflexion  3+/5    Left  Ankle Plantar Flexion  3+/5                   [facility] Adult PT Treatment/Exercise - 12/13/18 0001      Ambulation/Gait   Ambulation/Gait  Yes    Ambulation/Gait Assistance  5: Supervision;4: Min guard    Ambulation Distance (Feet)  150 Feet    Assistive device  None;Straight cane    Gait Pattern  Step-to pattern;Step-through pattern;Decreased step length - left;Decreased stance time - right;Decreased hip/knee flexion - left;Decreased weight shift to left    Gait Comments  ambulating with good weight shift to R, still with slightly bent R knee at heel strike, with decreased step length on L without SPC      Knee/Hip Exercises: Stretches   Passive Hamstring Stretch  Right;2 reps;30 seconds    Passive Hamstring Stretch Limitations  supine; strap    Hip Flexor Stretch  Right;2 reps;30 seconds    Hip Flexor Stretch Limitations  strap in mod thoms position    cues to avoid pushing into pain     Knee/Hip Exercises: Aerobic   Nustep  L1 x 85mn LEs only      Knee/Hip Exercises: Supine   Quad Sets  Strengthening;Right;1 set;10 reps    Quad Sets Limitations  10x10" with 1/2 bolster under ankles      Vasopneumatic   Number Minutes Vasopneumatic   10 minutes    Vasopnuematic Location   Knee   R    Vasopneumatic Pressure  Low    Vasopneumatic Temperature   coldest temp.               PT Education - 12/13/18 1057    Education Details  discussion on objective improvements with PT    Person(s) Educated  Patient    Methods  Explanation;Demonstration    Comprehension  Verbalized understanding       PT Short Term Goals - 12/13/18 1026      PT SHORT TERM GOAL #1   Title  Patient to be independent with initial HEP.    Time  4    Period  Weeks    Status  Achieved    Target Date  12/28/18        PT Long Term Goals - 12/13/18 1026      PT LONG TERM GOAL #1   Title  Patient to be independent with advanced HEP.    Time  8    Period  Weeks    Status  Partially Met   met for current     PT LONG TERM GOAL #2   Title  Patient to demonstrate R knee AROM 0-120 degrees.     Time  8    Period  Weeks    Status  Partially Met   6-94 degrees R knee AROM, 5-96 degrees PROM     PT LONG TERM GOAL #3   Title  Patient to demonstrate B LE strength >=4+/5.     Time  8    Period  Weeks    Status  Partially Met   improvement in B hip flexion and abduction, R knee flexion and extension, and B ankle dorsiflexion strength     PT LONG TERM GOAL #4   Title  Patient to demonstrate symmetrical strep length, weight shift, and knee flexion with LRAD.    Time  8    Period  Weeks    Status  Partially Met   good weight shift to R,  still with slightly bent R knee at heel strike, with decreased step length on L without SPC     PT LONG TERM GOAL #5   Title  Pt will report overall improvement in B knee pain by 90%.    Time  8    Period  Weeks    Status  Partially Met   reports 25% improvement in pain           Plan - 12/13/18 1057    Clinical Impression Statement  Patient arrived to session with report of compliance with HEP. Reports 20% improvement in R knee pain since initial eval. Patient able to reach 6-94 degrees R knee AROM, 5-96 degrees PROM. Strength testing revealed  improvement in B hip flexion and abduction, R knee flexion and extension, and B ankle dorsiflexion strength. Patient ambulating with good weight shift to R, still with slightly bent R knee at heel strike, with decreased step length on L without SPC. Worked on LE stretching and quad sets to improve R knee extension today with patient reporting soreness in R knee. Ended session with Gameready to R knee for pain relief. No complaints at end of session. Patient showing excellent improvements with PT thus far, will benefit from continued skilled PT services to address goals.     Clinical Impairments Affecting Rehab Potential  HTN, HLD, depression, cataract, L knee surgery, L breast lumpectomy, R ankle surgery    PT Treatment/Interventions  ADLs/Self Care Home Management;Cryotherapy;Electrical Stimulation;Functional mobility training;Stair training;Gait training;DME Instruction;Ultrasound;Moist Heat;Therapeutic activities;Therapeutic exercise;Balance training;Neuromuscular re-education;Patient/family education;Orthotic Fit/Training;Passive range of motion;Scar mobilization;Manual techniques;Dry needling;Energy conservation;Splinting;Taping;Vasopneumatic Device    PT Next Visit Plan  progress to standing activities as able; wean from [facility] as able    Consulted and Agree with Plan of Care  Patient       Patient will benefit from skilled therapeutic intervention in order to improve the following deficits and impairments:  Abnormal gait, Decreased endurance, Hypomobility, Increased edema, Decreased scar mobility, Decreased activity tolerance, Decreased strength, Pain, Difficulty walking, Decreased balance, Decreased range of motion, Postural dysfunction, Impaired flexibility, Improper body mechanics  Visit Diagnosis: Acute pain of right knee  Stiffness of right knee, not elsewhere classified  Muscle weakness (generalized)  Difficulty in walking, not elsewhere classified     Problem List Patient Active  Problem List   Diagnosis Date Noted  . Primary localized osteoarthritis of right knee 11/21/2018  . Primary osteoarthritis of right knee 11/21/2018  . Personal history of colonic adenoma 08/08/2013  . Family history of colon cancer 08/08/2013     Janene Harvey, PT, DPT 12/13/18 11:04 AM   [facility] [address]  [place], [place], [address] Phone: [phone]   Fax:  [phone]  Name: Mischa Brittingham MRN: [id number] Date of Birth: 1951-02-21

## 2018-12-14 ENCOUNTER — Ambulatory Visit: Payer: Medicare Other | Admitting: Physical Therapy

## 2018-12-18 ENCOUNTER — Ambulatory Visit: Payer: Medicare Other

## 2018-12-18 DIAGNOSIS — M25561 Pain in right knee: Secondary | ICD-10-CM

## 2018-12-18 DIAGNOSIS — M6281 Muscle weakness (generalized): Secondary | ICD-10-CM

## 2018-12-18 DIAGNOSIS — M25661 Stiffness of right knee, not elsewhere classified: Secondary | ICD-10-CM

## 2018-12-18 DIAGNOSIS — R262 Difficulty in walking, not elsewhere classified: Secondary | ICD-10-CM

## 2018-12-18 NOTE — Therapy (Signed)
[facility] [address]  [place], [place], [address] Phone: [phone]   Fax:  [phone]  Physical Therapy Treatment  Patient Details  Name: Stacy Duncan MRN: [id number] Date of Birth: 02/19/1951 Referring Provider (PT): Melrose Nakayama, MD   Encounter Date: 12/18/2018  PT End of Session - 12/18/18 1105    Visit Number  6    Number of Visits  17    Date for PT Re-Evaluation  01/25/19    Authorization Type  Medicare & Federal BCBS    PT Start Time  1101    PT Stop Time  1155    PT Time Calculation (min)  54 min    Activity Tolerance  Patient tolerated treatment well;Patient limited by pain    Behavior During Therapy  [facility] for tasks assessed/performed       Past Medical History:  Diagnosis Date  . Cataract   . Depression   . Hypercholesteremia   . Hypertension     Past Surgical History:  Procedure Laterality Date  . ANKLE SURGERY Right   . BREAST LUMPECTOMY Left    mid 56s  . KNEE SURGERY Left   . 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  Subjective Assessment - 12/18/18 1106    Subjective  Doing well today.  No new complaints.      Pertinent History  HTN, HLD, depression, cataract, L knee surgery, L breast lumpectomy, R ankle surgery    Diagnostic tests  none recent    Patient Stated Goals  "get my balance and my bend back in my knee"    Currently in Pain?  Yes    Pain Score  4     Pain Location  Knee    Pain Orientation  Right    Pain Type  Acute pain;Surgical pain    Multiple Pain Sites  No                       [facility] Adult PT Treatment/Exercise - 12/18/18 1130      Knee/Hip Exercises: Aerobic   Recumbent Bike  partial and full revolutions, 6 min       Knee/Hip Exercises: Standing   Heel Raises   Both;20 reps    Functional Squat  1 set;15 reps;3 seconds    SLS  B SLS 2 x 10 sec each; intermittent counter support      Knee/Hip Exercises: Seated   Sit to Sand  10 reps;without UE support   from mat table; cues required for slow descending     Knee/Hip Exercises: Supine   Straight Leg Raises  Right;15 reps      Vasopneumatic   Number Minutes Vasopneumatic   10 minutes    Vasopnuematic Location   Knee    Vasopneumatic Pressure  Low    Vasopneumatic Temperature   coldest temp.        Manual Therapy   Manual Therapy  Soft tissue mobilization;Joint mobilization    Manual therapy comments  supine     Joint Mobilization  R patellar mobs all directions (somewhat limited) - instructed pt. in this for home performance     Soft tissue mobilization  scar massage with lotion - instructed pt. in this  PT Short Term Goals - 12/13/18 1026      PT SHORT TERM GOAL #1   Title  Patient to be independent with initial HEP.    Time  4    Period  Weeks    Status  Achieved    Target Date  12/28/18        PT Long Term Goals - 12/13/18 1026      PT LONG TERM GOAL #1   Title  Patient to be independent with advanced HEP.    Time  8    Period  Weeks    Status  Partially Met   met for current     PT LONG TERM GOAL #2   Title  Patient to demonstrate R knee AROM 0-120 degrees.     Time  8    Period  Weeks    Status  Partially Met   6-94 degrees R knee AROM, 5-96 degrees PROM     PT LONG TERM GOAL #3   Title  Patient to demonstrate B LE strength >=4+/5.     Time  8    Period  Weeks    Status  Partially Met   improvement in B hip flexion and abduction, R knee flexion and extension, and B ankle dorsiflexion strength     PT LONG TERM GOAL #4   Title  Patient to demonstrate symmetrical strep length, weight shift, and knee flexion with LRAD.    Time  8    Period  Weeks    Status  Partially Met   good weight shift to R, still with slightly bent R knee at heel  strike, with decreased step length on L without SPC     PT LONG TERM GOAL #5   Title  Pt will report overall improvement in B knee pain by 90%.    Time  8    Period  Weeks    Status  Partially Met   reports 25% improvement in pain           Plan - 12/18/18 1127    Clinical Impression Statement  Stacy Duncan doing well today.  Reports still having some difficulty rising from low surfaces.  Strengthening therex focused on improving pt. ability to rise from low surfaces with pt. reporting she will practice at home.  Tolerated all activities well today and MT addressing scar massage and R patellar mobs performed (mod limitation) with pt. instructed in self performance at home.  Ended visit with ice/compression to R knee to reduce post-exercise soreness and swelling.  Progressing well toward goals.      Rehab Potential  Good    Clinical Impairments Affecting Rehab Potential  HTN, HLD, depression, cataract, L knee surgery, L breast lumpectomy, R ankle surgery    PT Treatment/Interventions  ADLs/Self Care Home Management;Cryotherapy;Electrical Stimulation;Functional mobility training;Stair training;Gait training;DME Instruction;Ultrasound;Moist Heat;Therapeutic activities;Therapeutic exercise;Balance training;Neuromuscular re-education;Patient/family education;Orthotic Fit/Training;Passive range of motion;Scar mobilization;Manual techniques;Dry needling;Energy conservation;Splinting;Taping;Vasopneumatic Device    PT Next Visit Plan  progress to standing activities as able; wean from [facility] as able    Consulted and Agree with Plan of Care  Patient       Patient will benefit from skilled therapeutic intervention in order to improve the following deficits and impairments:  Abnormal gait, Decreased endurance, Hypomobility, Increased edema, Decreased scar mobility, Decreased activity tolerance, Decreased strength, Pain, Difficulty walking, Decreased balance, Decreased range of motion, Postural dysfunction,  Impaired flexibility, Improper body mechanics  Visit Diagnosis: Acute pain of right knee  Stiffness of right knee, not elsewhere classified  Muscle weakness (generalized)  Difficulty in walking, not elsewhere classified     Problem List Patient Active Problem List   Diagnosis Date Noted  . Primary localized osteoarthritis of right knee 11/21/2018  . Primary osteoarthritis of right knee 11/21/2018  . Personal history of colonic adenoma 08/08/2013  . Family history of colon cancer 08/08/2013    Bess Harvest, PTA 12/18/18 12:02 PM   [facility] [address]  [place], [place], [address] Phone: [phone]   Fax:  [phone]  Name: Shalese Strahan MRN: [id number] Date of Birth: 08/04/1951

## 2018-12-21 ENCOUNTER — Ambulatory Visit: Payer: Medicare Other

## 2018-12-21 DIAGNOSIS — M25661 Stiffness of right knee, not elsewhere classified: Secondary | ICD-10-CM

## 2018-12-21 DIAGNOSIS — M6281 Muscle weakness (generalized): Secondary | ICD-10-CM

## 2018-12-21 DIAGNOSIS — R2689 Other abnormalities of gait and mobility: Secondary | ICD-10-CM

## 2018-12-21 DIAGNOSIS — R262 Difficulty in walking, not elsewhere classified: Secondary | ICD-10-CM

## 2018-12-21 DIAGNOSIS — M25561 Pain in right knee: Secondary | ICD-10-CM | POA: Diagnosis not present

## 2018-12-21 NOTE — Therapy (Signed)
[facility] [address]  [place], [place], [address] Phone: [phone]   Fax:  [phone]  Physical Therapy Treatment  Patient Details  Name: Stacy Duncan MRN: [id number] Date of Birth: February 02, 1951 Referring Provider (PT): Melrose Nakayama, MD   Encounter Date: 12/21/2018  PT End of Session - 12/21/18 1019    Visit Number  7    Number of Visits  17    Date for PT Re-Evaluation  01/25/19    Authorization Type  Medicare & Federal BCBS    PT Start Time  9924    PT Stop Time  1105    PT Time Calculation (min)  50 min    Activity Tolerance  Patient tolerated treatment well;Patient limited by pain    Behavior During Therapy  [facility] for tasks assessed/performed       Past Medical History:  Diagnosis Date  . Cataract   . Depression   . Hypercholesteremia   . Hypertension     Past Surgical History:  Procedure Laterality Date  . ANKLE SURGERY Right   . BREAST LUMPECTOMY Left    mid 55s  . KNEE SURGERY Left   . 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  Subjective Assessment - 12/21/18 1017    Subjective  Pt. reporting she feels she is improving with therapy.      Pertinent History  HTN, HLD, depression, cataract, L knee surgery, L breast lumpectomy, R ankle surgery    Patient Stated Goals  "get my balance and my bend back in my knee"    Currently in Pain?  Yes    Pain Score  6     Pain Location  Knee    Pain Orientation  Right    Pain Descriptors / Indicators  Aching;Discomfort    Pain Type  Acute pain;Surgical pain         [facility] PT Assessment - 12/21/18 1019      AROM   AROM Assessment Site  Knee    Right/Left Knee  Right    Right Knee Extension  4    Right Knee Flexion  111      PROM   PROM Assessment Site  Knee    Right/Left Knee  Right    Right Knee Extension  3    Right Knee Flexion  114                   [facility] Adult PT Treatment/Exercise - 12/21/18 1031      Ambulation/Gait   Ambulation/Gait  Yes    Ambulation/Gait Assistance  5: Supervision;4: Min guard    Ambulation Distance (Feet)  150 Feet    Assistive device  None;Straight cane    Gait Pattern  Step-through pattern;Decreased step length - right;Decreased step length - left    Stairs  Yes    Stairs Assistance  6: Modified independent (Device/Increase time)    Stair Management Technique  One rail Right;Alternating pattern    Number of Stairs  5    Height of Stairs  7    Gait Comments  Pt. requiring cueing for increased step length without AD with good carryover following cueing; Pt. navigating stairs with reciprocal pattern requiring increased time and single rail use and decreased R quad eccentric control evident.  Knee/Hip Exercises: Stretches   Sports administrator  Right;1 rep;60 seconds    Sports administrator Limitations  strap       Knee/Hip Exercises: Aerobic   Nustep  L2 x 6 min LEs only seated moved up to lvl 8      Knee/Hip Exercises: Machines for Strengthening   Cybex Knee Extension  B LE's: 10# x 10 rpes     Cybex Knee Flexion  B LE's: 15# x 15 reps       Knee/Hip Exercises: Standing   Lateral Step Up  Right;10 reps;Hand Hold: 1;Step Height: 6"    Lateral Step Up Limitations  cues for eccentric control lowering     Forward Step Up  Right;10 reps;Hand Hold: 1;Step Height: 6"    Forward Step Up Limitations  cues for eccentric control lowering     Functional Squat  1 set;15 reps;3 seconds    Functional Squat Limitations  triple extension at counter       Vasopneumatic   Number Minutes Vasopneumatic   10 minutes    Vasopnuematic Location   Knee   R   Vasopneumatic Pressure  Low    Vasopneumatic Temperature   coldest temp.                 PT Short Term Goals - 12/13/18 1026      PT SHORT TERM GOAL #1    Title  Patient to be independent with initial HEP.    Time  4    Period  Weeks    Status  Achieved    Target Date  12/28/18        PT Long Term Goals - 12/13/18 1026      PT LONG TERM GOAL #1   Title  Patient to be independent with advanced HEP.    Time  8    Period  Weeks    Status  Partially Met   met for current     PT LONG TERM GOAL #2   Title  Patient to demonstrate R knee AROM 0-120 degrees.     Time  8    Period  Weeks    Status  Partially Met   6-94 degrees R knee AROM, 5-96 degrees PROM     PT LONG TERM GOAL #3   Title  Patient to demonstrate B LE strength >=4+/5.     Time  8    Period  Weeks    Status  Partially Met   improvement in B hip flexion and abduction, R knee flexion and extension, and B ankle dorsiflexion strength     PT LONG TERM GOAL #4   Title  Patient to demonstrate symmetrical strep length, weight shift, and knee flexion with LRAD.    Time  8    Period  Weeks    Status  Partially Met   good weight shift to R, still with slightly bent R knee at heel strike, with decreased step length on L without SPC     PT LONG TERM GOAL #5   Title  Pt will report overall improvement in B knee pain by 90%.    Time  8    Period  Weeks    Status  Partially Met   reports 25% improvement in pain           Plan - 12/21/18 1045    Clinical Impression Statement  Brigida doing well today with no new complaints.  Tolerated progression of forward, lateral step-ups to  6" well today demonstrating improved eccentric quad control.  Able to progress toward LTG #2 demonstrating improved R knee ROM, AROM 4-111 dg, PROM 3-114 dg.  Ended visit with ice/compression to R knee to reduce post-exercise swelling and pain.      Rehab Potential  Good    Clinical Impairments Affecting Rehab Potential  HTN, HLD, depression, cataract, L knee surgery, L breast lumpectomy, R ankle surgery    PT Treatment/Interventions  ADLs/Self Care Home Management;Cryotherapy;Electrical  Stimulation;Functional mobility training;Stair training;Gait training;DME Instruction;Ultrasound;Moist Heat;Therapeutic activities;Therapeutic exercise;Balance training;Neuromuscular re-education;Patient/family education;Orthotic Fit/Training;Passive range of motion;Scar mobilization;Manual techniques;Dry needling;Energy conservation;Splinting;Taping;Vasopneumatic Device    Consulted and Agree with Plan of Care  Patient       Patient will benefit from skilled therapeutic intervention in order to improve the following deficits and impairments:  Abnormal gait, Decreased endurance, Hypomobility, Increased edema, Decreased scar mobility, Decreased activity tolerance, Decreased strength, Pain, Difficulty walking, Decreased balance, Decreased range of motion, Postural dysfunction, Impaired flexibility, Improper body mechanics  Visit Diagnosis: Acute pain of right knee  Stiffness of right knee, not elsewhere classified  Muscle weakness (generalized)  Difficulty in walking, not elsewhere classified  Other abnormalities of gait and mobility     Problem List Patient Active Problem List   Diagnosis Date Noted  . Primary localized osteoarthritis of right knee 11/21/2018  . Primary osteoarthritis of right knee 11/21/2018  . Personal history of colonic adenoma 08/08/2013  . Family history of colon cancer 08/08/2013    Bess Harvest, PTA 12/21/18 11:09 AM   [facility] [address]  [place], [place], [address] Phone: [phone]   Fax:  [phone]  Name: Shanette Tamargo MRN: [id number] Date of Birth: September 05, 1951

## 2018-12-25 ENCOUNTER — Encounter: Payer: Self-pay | Admitting: Physical Therapy

## 2018-12-25 ENCOUNTER — Ambulatory Visit: Payer: Medicare Other | Attending: Orthopaedic Surgery | Admitting: Physical Therapy

## 2018-12-25 DIAGNOSIS — R262 Difficulty in walking, not elsewhere classified: Secondary | ICD-10-CM | POA: Diagnosis present

## 2018-12-25 DIAGNOSIS — M25661 Stiffness of right knee, not elsewhere classified: Secondary | ICD-10-CM | POA: Diagnosis present

## 2018-12-25 DIAGNOSIS — M25561 Pain in right knee: Secondary | ICD-10-CM | POA: Insufficient documentation

## 2018-12-25 DIAGNOSIS — M6281 Muscle weakness (generalized): Secondary | ICD-10-CM | POA: Insufficient documentation

## 2018-12-25 NOTE — Therapy (Signed)
[facility] [address]  [place], [place], [address] Phone: [phone]   Fax:  [phone]  Physical Therapy Treatment  Patient Details  Name: Stacy Duncan MRN: [id number] Date of Birth: Mar 18, 1951 Referring Provider (PT): Melrose Nakayama, MD   Encounter Date: 12/25/2018  PT End of Session - 12/25/18 1346    Visit Number  8    Number of Visits  17    Date for PT Re-Evaluation  01/25/19    Authorization Type  Medicare & Federal BCBS    PT Start Time  1310    PT Stop Time  1359    PT Time Calculation (min)  49 min    Activity Tolerance  Patient tolerated treatment well;Patient limited by pain    Behavior During Therapy  [facility] for tasks assessed/performed       Past Medical History:  Diagnosis Date  . Cataract   . Depression   . Hypercholesteremia   . Hypertension     Past Surgical History:  Procedure Laterality Date  . ANKLE SURGERY Right   . BREAST LUMPECTOMY Left    mid 58s  . KNEE SURGERY Left   . 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  Subjective Assessment - 12/25/18 1312    Subjective  Reports that she went to the chiropractor on Friday and told her that her R LE is 6/10 of an inch longer than her L LE. Believes this is what has been making her hip hurt. Notes that he "straightened her L LE out" and it is feeling better.     Pertinent History  HTN, HLD, depression, cataract, L knee surgery, L breast lumpectomy, R ankle surgery    Diagnostic tests  none recent    Patient Stated Goals  "get my balance and my bend back in my knee"    Currently in Pain?  Yes    Pain Score  5     Pain Location  Knee    Pain Orientation  Right    Pain Descriptors / Indicators  Aching    Pain Type  Acute pain;Surgical pain    Pain Score  6     Pain Location  Knee    Pain Orientation  Left    Pain Descriptors / Indicators  Sharp    Pain Type  Chronic pain                       [facility] Adult PT Treatment/Exercise - 12/25/18 0001      Exercises   Exercises  Knee/Hip      Knee/Hip Exercises: Stretches   Passive Hamstring Stretch  Right;2 reps;30 seconds    Passive Hamstring Stretch Limitations  supine; strap    ITB Stretch  Right;2 reps;30 seconds    ITB Stretch Limitations  supine with strap      Knee/Hip Exercises: Aerobic   Recumbent Bike  L2 x 4 min; L1 x 2 min      Knee/Hip Exercises: Machines for Strengthening   Cybex Knee Extension  B LE's: 10# x 10     lacking TKE   Cybex Knee Flexion  B LE's: 10# x 15 reps    could not tolerate 15lbs     Knee/Hip Exercises: Standing   Lateral Step  Up  Right;10 reps;Hand Hold: 2;Step Height: 4"    Lateral Step Up Limitations  step down with heel touch; cues to avoid L hip drop    Forward Step Up  Right;10 reps;Hand Hold: 1;Step Height: 6";Step Height: 4"    Forward Step Up Limitations  cues to avoid hip drop as compensation and encouragement to bend R knee   increased pain in R knee today   SLS  R LE SLS on foam at counter top 3x40"   cues for R glute med activation     Knee/Hip Exercises: Sidelying   Hip ABduction  Strengthening;Right;Left;1 set;10 reps    Hip ABduction Limitations  manual cues for alignment      Vasopneumatic   Number Minutes Vasopneumatic   15 minutes    Vasopnuematic Location   Knee   R   Vasopneumatic Pressure  Low    Vasopneumatic Temperature   coldest temp.               PT Education - 12/25/18 1346    Education Details  update to HEP    Person(s) Educated  Patient    Methods  Explanation;Demonstration;Tactile cues;Verbal cues;Handout    Comprehension  Verbalized understanding;Returned demonstration       PT Short Term Goals - 12/13/18 1026      PT SHORT TERM GOAL #1   Title  Patient to be independent with initial  HEP.    Time  4    Period  Weeks    Status  Achieved    Target Date  12/28/18        PT Long Term Goals - 12/13/18 1026      PT LONG TERM GOAL #1   Title  Patient to be independent with advanced HEP.    Time  8    Period  Weeks    Status  Partially Met   met for current     PT LONG TERM GOAL #2   Title  Patient to demonstrate R knee AROM 0-120 degrees.     Time  8    Period  Weeks    Status  Partially Met   6-94 degrees R knee AROM, 5-96 degrees PROM     PT LONG TERM GOAL #3   Title  Patient to demonstrate B LE strength >=4+/5.     Time  8    Period  Weeks    Status  Partially Met   improvement in B hip flexion and abduction, R knee flexion and extension, and B ankle dorsiflexion strength     PT LONG TERM GOAL #4   Title  Patient to demonstrate symmetrical strep length, weight shift, and knee flexion with LRAD.    Time  8    Period  Weeks    Status  Partially Met   good weight shift to R, still with slightly bent R knee at heel strike, with decreased step length on L without SPC     PT LONG TERM GOAL #5   Title  Pt will report overall improvement in B knee pain by 90%.    Time  8    Period  Weeks    Status  Partially Met   reports 25% improvement in pain           Plan - 12/25/18 1346    Clinical Impression Statement  Patient arrived to session with report of increased R hip pain that brought her to the chiropractor, who revealed that her  R leg was longer than her L. Reports relief from chiropractic treatment. Worked on anterior and lateral step up + downs with patient demonstrating hip drop to compensate for R knee flexion hesitancy and decreased eccentric control. Given cues to activate glute med as patient again with tendency to drop L hip with SLS on foam. Updated HEP to include this exercise to be performed at counter top. Patient reported understanding. D/t patient's report of increased pain in R knee at end of session, finished up session with [place] to  R knee. No complaints at conclusion of appointment.     Clinical Impairments Affecting Rehab Potential  HTN, HLD, depression, cataract, L knee surgery, L breast lumpectomy, R ankle surgery    PT Treatment/Interventions  ADLs/Self Care Home Management;Cryotherapy;Electrical Stimulation;Functional mobility training;Stair training;Gait training;DME Instruction;Ultrasound;Moist Heat;Therapeutic activities;Therapeutic exercise;Balance training;Neuromuscular re-education;Patient/family education;Orthotic Fit/Training;Passive range of motion;Scar mobilization;Manual techniques;Dry needling;Energy conservation;Splinting;Taping;Vasopneumatic Device    PT Next Visit Plan  progress to standing activities; wean from [facility] as able    Consulted and Agree with Plan of Care  Patient       Patient will benefit from skilled therapeutic intervention in order to improve the following deficits and impairments:  Abnormal gait, Decreased endurance, Hypomobility, Increased edema, Decreased scar mobility, Decreased activity tolerance, Decreased strength, Pain, Difficulty walking, Decreased balance, Decreased range of motion, Postural dysfunction, Impaired flexibility, Improper body mechanics  Visit Diagnosis: Acute pain of right knee  Stiffness of right knee, not elsewhere classified  Muscle weakness (generalized)  Difficulty in walking, not elsewhere classified     Problem List Patient Active Problem List   Diagnosis Date Noted  . Primary localized osteoarthritis of right knee 11/21/2018  . Primary osteoarthritis of right knee 11/21/2018  . Personal history of colonic adenoma 08/08/2013  . Family history of colon cancer 08/08/2013     Janene Harvey, PT, DPT 12/25/18 2:35 PM   [facility] [address]  [place], [place], [address] Phone: [phone]   Fax:  [phone]  Name: Stacy Duncan MRN: [id number] Date of Birth:  Oct 16, 1951

## 2018-12-28 ENCOUNTER — Ambulatory Visit: Payer: Medicare Other

## 2018-12-28 DIAGNOSIS — R262 Difficulty in walking, not elsewhere classified: Secondary | ICD-10-CM

## 2018-12-28 DIAGNOSIS — M6281 Muscle weakness (generalized): Secondary | ICD-10-CM

## 2018-12-28 DIAGNOSIS — M25561 Pain in right knee: Secondary | ICD-10-CM

## 2018-12-28 DIAGNOSIS — M25661 Stiffness of right knee, not elsewhere classified: Secondary | ICD-10-CM

## 2018-12-28 NOTE — Therapy (Signed)
[facility] [address]  [place], [place], [address] Phone: [phone]   Fax:  [phone]  Physical Therapy Treatment  Patient Details  Name: Stacy Duncan MRN: [id number] Date of Birth: 05/08/1951 Referring Provider (PT): Melrose Nakayama, MD   Encounter Date: 12/28/2018  PT End of Session - 12/28/18 1107    Visit Number  9    Number of Visits  17    Date for PT Re-Evaluation  01/25/19    Authorization Type  Medicare & Federal BCBS    PT Start Time  1101    PT Stop Time  1204    PT Time Calculation (min)  63 min    Activity Tolerance  Patient tolerated treatment well;Patient limited by pain    Behavior During Therapy  [facility] for tasks assessed/performed       Past Medical History:  Diagnosis Date  . Cataract   . Depression   . Hypercholesteremia   . Hypertension     Past Surgical History:  Procedure Laterality Date  . ANKLE SURGERY Right   . BREAST LUMPECTOMY Left    mid 16s  . KNEE SURGERY Left   . 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  Subjective Assessment - 12/28/18 1105    Subjective  [name] denies soreness after last session.      Pertinent History  HTN, HLD, depression, cataract, L knee surgery, L breast lumpectomy, R ankle surgery    Diagnostic tests  none recent    Patient Stated Goals  "get my balance and my bend back in my knee"    Currently in Pain?  Yes    Pain Score  5     Pain Location  Knee    Pain Orientation  Right    Pain Descriptors / Indicators  Aching    Pain Type  Acute pain;Surgical pain    Multiple Pain Sites  No    Pain Score  6    Pain Location  Knee    Pain Orientation  Left    Pain Descriptors / Indicators  Sharp    Pain Type  Chronic pain         [facility] PT Assessment - 12/28/18 0001      Assessment   Next MD Visit  01/12/19      AROM   AROM Assessment Site  Knee    Right/Left Knee  Right    Right Knee Extension  4    Right Knee Flexion  116      PROM   Right Knee Extension  3    Right Knee Flexion  118                   [facility] Adult PT Treatment/Exercise - 12/28/18 1120      Knee/Hip Exercises: Stretches   Passive Hamstring Stretch  Right;1 rep;30 seconds    Quad Stretch  Right;1 rep;60 seconds    Quad Stretch Limitations  strap     ITB Stretch  Right;1 rep;30 seconds    ITB Stretch Limitations  supine with strap    Piriformis Stretch  Right;1 rep;30 seconds    Piriformis Stretch Limitations  KTOS      Knee/Hip Exercises: Aerobic   Recumbent Bike  L2 x 7 min  Knee/Hip Exercises: Standing   Heel Raises  Both;20 reps    Terminal Knee Extension  Right;15 reps;Theraband;Strengthening    Theraband Level (Terminal Knee Extension)  Level 4 (Blue)    Terminal Knee Extension Limitations  cues for TKE and reduction in hip substitutions     Step Down  Right;10 reps;Hand Hold: 2;Step Height: 4"    Step Down Limitations  Cues to prevent hip drop at machine with B UE support    Other Standing Knee Exercises  Standing side-stepping with red TB at ankles 2 x 25 ft     Other Standing Knee Exercises  Tandem walk at counter top with intermittent UE support x 2 laps      Knee/Hip Exercises: Supine   Bridges with Ball Squeeze  Both;15 reps;Strengthening   adduction ball squeeze     Vasopneumatic   Number Minutes Vasopneumatic   15 minutes    Vasopnuematic Location   Knee   R   Vasopneumatic Pressure  Low    Vasopneumatic Temperature   coldest temp.        Manual Therapy   Manual Therapy  Soft tissue mobilization    Manual therapy comments  supine     Soft tissue mobilization  scar massage with lotion - instructed pt. in this                PT Short Term Goals - 12/13/18 1026      PT SHORT TERM GOAL #1   Title  Patient to be independent  with initial HEP.    Time  4    Period  Weeks    Status  Achieved    Target Date  12/28/18        PT Long Term Goals - 12/13/18 1026      PT LONG TERM GOAL #1   Title  Patient to be independent with advanced HEP.    Time  8    Period  Weeks    Status  Partially Met   met for current     PT LONG TERM GOAL #2   Title  Patient to demonstrate R knee AROM 0-120 degrees.     Time  8    Period  Weeks    Status  Partially Met   6-94 degrees R knee AROM, 5-96 degrees PROM     PT LONG TERM GOAL #3   Title  Patient to demonstrate B LE strength >=4+/5.     Time  8    Period  Weeks    Status  Partially Met   improvement in B hip flexion and abduction, R knee flexion and extension, and B ankle dorsiflexion strength     PT LONG TERM GOAL #4   Title  Patient to demonstrate symmetrical strep length, weight shift, and knee flexion with LRAD.    Time  8    Period  Weeks    Status  Partially Met   good weight shift to R, still with slightly bent R knee at heel strike, with decreased step length on L without SPC     PT LONG TERM GOAL #5   Title  Pt will report overall improvement in B knee pain by 90%.    Time  8    Period  Weeks    Status  Partially Met   reports 25% improvement in pain           Plan - 12/28/18 1111    Clinical Impression Statement  Stacy Duncan progressing well with ROM activities in therapy.  Able to progress toward LTG #2 today demonstrating improved flexion AROM to 116 dg, and 118 PROM flexion today.  Reports decreasing need for [facility] now at home and in community.  Tolerated progression of eccentric step-down and other LE strengthening activities today well.  Ended visit with ice/compression to R knee to reduce post-exercise swelling and pain.      Rehab Potential  Good    Clinical Impairments Affecting Rehab Potential  HTN, HLD, depression, cataract, L knee surgery, L breast lumpectomy, R ankle surgery    PT Treatment/Interventions  ADLs/Self Care Home  Management;Cryotherapy;Electrical Stimulation;Functional mobility training;Stair training;Gait training;DME Instruction;Ultrasound;Moist Heat;Therapeutic activities;Therapeutic exercise;Balance training;Neuromuscular re-education;Patient/family education;Orthotic Fit/Training;Passive range of motion;Scar mobilization;Manual techniques;Dry needling;Energy conservation;Splinting;Taping;Vasopneumatic Device    PT Next Visit Plan  progress to standing activities; wean from [facility] as able    Consulted and Agree with Plan of Care  Patient       Patient will benefit from skilled therapeutic intervention in order to improve the following deficits and impairments:  Abnormal gait, Decreased endurance, Hypomobility, Increased edema, Decreased scar mobility, Decreased activity tolerance, Decreased strength, Pain, Difficulty walking, Decreased balance, Decreased range of motion, Postural dysfunction, Impaired flexibility, Improper body mechanics  Visit Diagnosis: Acute pain of right knee  Stiffness of right knee, not elsewhere classified  Muscle weakness (generalized)  Difficulty in walking, not elsewhere classified     Problem List Patient Active Problem List   Diagnosis Date Noted  . Primary localized osteoarthritis of right knee 11/21/2018  . Primary osteoarthritis of right knee 11/21/2018  . Personal history of colonic adenoma 08/08/2013  . Family history of colon cancer 08/08/2013    Bess Harvest, PTA 12/28/18 3:21 PM   [facility] [address]  [place], [place], [address] Phone: [phone]   Fax:  [phone]  Name: Taisia Fantini MRN: [id number] Date of Birth: 07-17-1951

## 2019-01-01 ENCOUNTER — Ambulatory Visit: Payer: Medicare Other | Admitting: Physical Therapy

## 2019-01-01 ENCOUNTER — Encounter: Payer: Self-pay | Admitting: Physical Therapy

## 2019-01-01 DIAGNOSIS — M6281 Muscle weakness (generalized): Secondary | ICD-10-CM

## 2019-01-01 DIAGNOSIS — M25661 Stiffness of right knee, not elsewhere classified: Secondary | ICD-10-CM

## 2019-01-01 DIAGNOSIS — R262 Difficulty in walking, not elsewhere classified: Secondary | ICD-10-CM

## 2019-01-01 DIAGNOSIS — M25561 Pain in right knee: Secondary | ICD-10-CM | POA: Diagnosis not present

## 2019-01-01 NOTE — Therapy (Signed)
[facility] [address]  [place], [place], [address] Phone: [phone]   Fax:  [phone]  Physical Therapy Progress Note  Patient Details  Name: Stacy Duncan MRN: [id number] Date of Birth: 08/15/1951 Referring Provider (PT): Melrose Nakayama, MD  Progress Note Reporting Period 11/30/18 to 01/01/19  See note below for Objective Data and Assessment of Progress/Goals.     Encounter Date: 01/01/2019  PT End of Session - 01/01/19 1105    Visit Number  10    Number of Visits  17    Date for PT Re-Evaluation  01/25/19    Authorization Type  Medicare & Federal BCBS    PT Start Time  1100    PT Stop Time  1141    PT Time Calculation (min)  41 min    Activity Tolerance  Patient tolerated treatment well;Patient limited by pain    Behavior During Therapy  WFL for tasks assessed/performed       Past Medical History:  Diagnosis Date  . Cataract   . Depression   . Hypercholesteremia   . Hypertension     Past Surgical History:  Procedure Laterality Date  . ANKLE SURGERY Right   . BREAST LUMPECTOMY Left    mid 27s  . KNEE SURGERY Left   . 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  Subjective Assessment - 01/01/19 1059    Subjective  Reports that she is doing well and weaned off her cane after last session. Reports that she believes she has made good progress with PT. Getting up out of chair is still a bit difficult after sitting for a while. Reports 75% improvement since initial eval.     Pertinent History  HTN, HLD, depression, cataract, L knee surgery, L breast lumpectomy, R ankle surgery    Patient Stated Goals  "get my balance and my bend back in my knee"    Currently in Pain?  Yes    Pain Score  4     Pain Location  Knee    Pain  Orientation  Right    Pain Descriptors / Indicators  Aching    Pain Type  Acute pain;Surgical pain    Pain Score  3    Pain Location  Knee    Pain Orientation  Left    Pain Descriptors / Indicators  Sharp    Pain Type  Chronic pain         [facility] PT Assessment - 01/01/19 0001      AROM   AROM Assessment Site  Knee    Right/Left Knee  Right    Right Knee Extension  4    Right Knee Flexion  115      PROM   Right Knee Extension  3    Right Knee Flexion  120      Strength   Right Hip Flexion  5/5    Right Hip ABduction  4+/5    Right Hip ADduction  4/5    Left Hip Flexion  5/5    Left Hip ABduction  4+/5    Left Hip ADduction  4+/5    Right Knee Flexion  4/5    Right Knee Extension  4+/5    Left Knee Flexion  4/5    Left Knee  Extension  4+/5    Right Ankle Dorsiflexion  4+/5    Right Ankle Plantar Flexion  4+/5    Left Ankle Dorsiflexion  4+/5    Left Ankle Plantar Flexion  4+/5                   [facility] Adult PT Treatment/Exercise - 01/01/19 0001      Ambulation/Gait   Ambulation/Gait  Yes    Ambulation/Gait Assistance  6: Modified independent (Device/Increase time)    Ambulation Distance (Feet)  50 Feet    Assistive device  None    Gait Pattern  Step-through pattern;Decreased step length - right;Decreased step length - left;Decreased stance time - right    Gait Comments  good heel strike and foot clearance on B feet, still with slightly decreased stance time on R LE d/t antalgia without SPC      Knee/Hip Exercises: Stretches   Passive Hamstring Stretch  Right;30 seconds;2 reps    Passive Hamstring Stretch Limitations  supine; strap    Other Knee/Hip Stretches  R knee flexion stretch 5x5" to tolerance      Knee/Hip Exercises: Aerobic   Recumbent Bike  L2 x 6 min       Knee/Hip Exercises: Standing   Terminal Knee Extension  Right;15 reps;Theraband;Strengthening    Theraband Level (Terminal Knee Extension)  Level 4 (Blue)    Terminal Knee Extension  Limitations  at chair; 15x3"      Knee/Hip Exercises: Supine   Bridges  Strengthening;Both;1 set;10 reps    Bridges Limitations  HS bridge   cues to slide feet forward to avoid cramping     Knee/Hip Exercises: Sidelying   Hip ADduction  Strengthening;Right;1 set;15 reps    Hip ADduction Limitations  cues to maintain TKE      Manual Therapy   Manual Therapy  Joint mobilization    Manual therapy comments  supine     Joint Mobilization  R patellar mobs all directions- good mobility, but painful             PT Education - 01/01/19 1147    Education Details  update to HEP; administered blue TB; discussion on objective progress with PT    Person(s) Educated  Patient    Methods  Explanation;Demonstration;Tactile cues;Verbal cues;Handout    Comprehension  Verbalized understanding;Returned demonstration       PT Short Term Goals - 01/01/19 1105      PT SHORT TERM GOAL #1   Title  Patient to be independent with initial HEP.    Time  4    Period  Weeks    Status  Achieved    Target Date  12/28/18        PT Long Term Goals - 01/01/19 1105      PT LONG TERM GOAL #1   Title  Patient to be independent with advanced HEP.    Time  8    Period  Weeks    Status  Partially Met   met for current     PT LONG TERM GOAL #2   Title  Patient to demonstrate R knee AROM 0-120 degrees.     Time  8    Period  Weeks    Status  Partially Met   4-115 degrees R knee AROM, 3-120 degrees PROM     PT LONG TERM GOAL #3   Title  Patient to demonstrate B LE strength >=4+/5.     Time  8  Period  Weeks    Status  Partially Met   improvement in B hip flexion, L hip adduction, R knee flexion and extension, L knee extension, L plantarflexion strength; most limited in R adduction and B knee flexion strength     PT LONG TERM GOAL #4   Title  Patient to demonstrate symmetrical strep length, weight shift, and knee flexion with LRAD.    Time  8    Period  Weeks    Status  Partially Met   good  heel strike and foot clearance on B feet, still with slightly decreased stance time on R LE d/t antalgia without SPC     PT LONG TERM GOAL #5   Title  Pt will report overall improvement in B knee pain by 90%.    Time  8    Period  Weeks    Status  Partially Met   reports 80% improvement in pain           Plan - 01/01/19 1155    Clinical Impression Statement  Patient arrived to session with 75% improvement in R knee since initial eval. Reports that she still has some trouble with getting up out of a chair when sitting for a while. Patient now ambulating without AD and demonstrating good heel strike and foot clearance on B feet, still with slightly decreased stance time on R LE d/t antalgia without SPC. Patient now reaching 4-115 degrees R knee AROM, 3-120 degrees PROM. Strength testing revealed improvement in B hip flexion, L hip adduction, R knee flexion and extension, L knee extension, L plantarflexion strength; most limited in R adduction and B knee flexion strength. Worked on LE strengthening ther-ex with focus on TKE, hamstring, and adductor strengthening. Updated HEP to address remaining impairments. Patient showing excellent progress with PT; plan to progress per POC.     Clinical Impairments Affecting Rehab Potential  HTN, HLD, depression, cataract, L knee surgery, L breast lumpectomy, R ankle surgery    PT Treatment/Interventions  ADLs/Self Care Home Management;Cryotherapy;Electrical Stimulation;Functional mobility training;Stair training;Gait training;DME Instruction;Ultrasound;Moist Heat;Therapeutic activities;Therapeutic exercise;Balance training;Neuromuscular re-education;Patient/family education;Orthotic Fit/Training;Passive range of motion;Scar mobilization;Manual techniques;Dry needling;Energy conservation;Splinting;Taping;Vasopneumatic Device    PT Next Visit Plan  HS, adductor strengthening; address TKE    Consulted and Agree with Plan of Care  Patient       Patient will  benefit from skilled therapeutic intervention in order to improve the following deficits and impairments:  Abnormal gait, Decreased endurance, Hypomobility, Increased edema, Decreased scar mobility, Decreased activity tolerance, Decreased strength, Pain, Difficulty walking, Decreased balance, Decreased range of motion, Postural dysfunction, Impaired flexibility, Improper body mechanics  Visit Diagnosis: Acute pain of right knee  Stiffness of right knee, not elsewhere classified  Muscle weakness (generalized)  Difficulty in walking, not elsewhere classified     Problem List Patient Active Problem List   Diagnosis Date Noted  . Primary localized osteoarthritis of right knee 11/21/2018  . Primary osteoarthritis of right knee 11/21/2018  . Personal history of colonic adenoma 08/08/2013  . Family history of colon cancer 08/08/2013     Janene Harvey, PT, DPT 01/01/19 12:00 PM   [facility] [address]  [place], [place], [address] Phone: [phone]   Fax:  [phone]  Name: Millisa Giarrusso MRN: [id number] Date of Birth: Mar 11, 1951

## 2019-01-03 ENCOUNTER — Ambulatory Visit: Payer: Medicare Other

## 2019-01-03 ENCOUNTER — Other Ambulatory Visit: Payer: Self-pay

## 2019-01-03 DIAGNOSIS — M25561 Pain in right knee: Secondary | ICD-10-CM | POA: Diagnosis not present

## 2019-01-03 DIAGNOSIS — M25661 Stiffness of right knee, not elsewhere classified: Secondary | ICD-10-CM

## 2019-01-03 DIAGNOSIS — R262 Difficulty in walking, not elsewhere classified: Secondary | ICD-10-CM

## 2019-01-03 DIAGNOSIS — M6281 Muscle weakness (generalized): Secondary | ICD-10-CM

## 2019-01-03 NOTE — Therapy (Signed)
[facility] [address]  [place], [place], [address] Phone: [phone]   Fax:  [phone]  Physical Therapy Treatment  Patient Details  Name: Stacy Duncan MRN: [id number] Date of Birth: April 20, 1951 Referring Provider (PT): Melrose Nakayama, MD   Encounter Date: 01/03/2019  PT End of Session - 01/03/19 1314    Visit Number  11    Number of Visits  17    Date for PT Re-Evaluation  01/25/19    Authorization Type  Medicare & Federal BCBS    PT Start Time  1311    PT Stop Time  1400    PT Time Calculation (min)  49 min    Activity Tolerance  Patient tolerated treatment well;Patient limited by pain    Behavior During Therapy  [facility] for tasks assessed/performed       Past Medical History:  Diagnosis Date  . Cataract   . Depression   . Hypercholesteremia   . Hypertension     Past Surgical History:  Procedure Laterality Date  . ANKLE SURGERY Right   . BREAST LUMPECTOMY Left    mid 40s  . KNEE SURGERY Left   . 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  Subjective Assessment - 01/03/19 1314    Subjective  Doing well today.      Pertinent History  HTN, HLD, depression, cataract, L knee surgery, L breast lumpectomy, R ankle surgery    Diagnostic tests  none recent    Patient Stated Goals  "get my balance and my bend back in my knee"    Currently in Pain?  Yes    Pain Score  3     Pain Location  Knee    Pain Orientation  Right    Pain Descriptors / Indicators  Aching    Pain Type  Acute pain;Surgical pain    Multiple Pain Sites  Yes    Pain Score  4    Pain Location  Knee    Pain Orientation  Left    Pain Descriptors / Indicators  Sharp    Pain Type  Chronic pain                       [facility] Adult PT Treatment/Exercise  - 01/03/19 1331      Knee/Hip Exercises: Stretches   Gastroc Stretch  Right;1 rep;60 seconds    Gastroc Stretch Limitations  prostretch       Knee/Hip Exercises: Aerobic   Recumbent Bike  L2 x 7 min       Knee/Hip Exercises: Standing   Terminal Knee Extension  Right;20 reps;Theraband;1 set    Theraband Level (Terminal Knee Extension)  Level 4 (Blue)    Terminal Knee Extension Limitations  TKE     SLS with Vectors  Alternating cone nock over/right 2 x 7 cones; supervision       Knee/Hip Exercises: Supine   Quad Sets  Right;10 reps    Quad Sets Limitations  5" hold with heels resting 1/2 foam bolster with therapist light overpressure     Bridges with [employer]  Both;15 reps;Strengthening   adduction ball squeeze    Straight Leg Raises  Right;10 reps;2 sets    Straight Leg Raises Limitations  1# - cues for quad  set prior to each rep      Vasopneumatic   Number Minutes Vasopneumatic   10 minutes    Vasopnuematic Location   Knee   R   Vasopneumatic Pressure  Low    Vasopneumatic Temperature   coldest temp.        Manual Therapy   Manual Therapy  Joint mobilization;Passive ROM    Manual therapy comments  supine     Joint Mobilization  R knee Posterior glide grade III with force applied to femur in heel popped on bolster     Passive ROM  R HS, gastroc stretch with therapist x 30 sec each way                PT Short Term Goals - 01/01/19 1105      PT SHORT TERM GOAL #1   Title  Patient to be independent with initial HEP.    Time  4    Period  Weeks    Status  Achieved    Target Date  12/28/18        PT Long Term Goals - 01/01/19 1105      PT LONG TERM GOAL #1   Title  Patient to be independent with advanced HEP.    Time  8    Period  Weeks    Status  Partially Met   met for current     PT LONG TERM GOAL #2   Title  Patient to demonstrate R knee AROM 0-120 degrees.     Time  8    Period  Weeks    Status  Partially Met   4-115 degrees R knee AROM,  3-120 degrees PROM     PT LONG TERM GOAL #3   Title  Patient to demonstrate B LE strength >=4+/5.     Time  8    Period  Weeks    Status  Partially Met   improvement in B hip flexion, L hip adduction, R knee flexion and extension, L knee extension, L plantarflexion strength; most limited in R adduction and B knee flexion strength     PT LONG TERM GOAL #4   Title  Patient to demonstrate symmetrical strep length, weight shift, and knee flexion with LRAD.    Time  8    Period  Weeks    Status  Partially Met   good heel strike and foot clearance on B feet, still with slightly decreased stance time on R LE d/t antalgia without SPC     PT LONG TERM GOAL #5   Title  Pt will report overall improvement in B knee pain by 90%.    Time  8    Period  Weeks    Status  Partially Met   reports 80% improvement in pain           Plan - 01/03/19 1316    Clinical Impression Statement  Stacy Duncan reporting she is performing HEP daily.  Tolerated focus of today's session on MT to promote improved PROM extension and quad/TKE strengthening for carryover well.  Ended visit with ice/compression to R knee to reduce post-exercise swelling and pain.  Progressing well toward goals.      Rehab Potential  Good    Clinical Impairments Affecting Rehab Potential  HTN, HLD, depression, cataract, L knee surgery, L breast lumpectomy, R ankle surgery    PT Treatment/Interventions  ADLs/Self Care Home Management;Cryotherapy;Electrical Stimulation;Functional mobility training;Stair training;Gait training;DME Instruction;Ultrasound;Moist Heat;Therapeutic activities;Therapeutic exercise;Balance training;Neuromuscular re-education;Patient/family education;Orthotic  Fit/Training;Passive range of motion;Scar mobilization;Manual techniques;Dry needling;Energy conservation;Splinting;Taping;Vasopneumatic Device    PT Next Visit Plan  HS, adductor strengthening; address TKE    Consulted and Agree with Plan of Care  Patient        Patient will benefit from skilled therapeutic intervention in order to improve the following deficits and impairments:  Abnormal gait, Decreased endurance, Hypomobility, Increased edema, Decreased scar mobility, Decreased activity tolerance, Decreased strength, Pain, Difficulty walking, Decreased balance, Decreased range of motion, Postural dysfunction, Impaired flexibility, Improper body mechanics  Visit Diagnosis: Acute pain of right knee  Stiffness of right knee, not elsewhere classified  Muscle weakness (generalized)  Difficulty in walking, not elsewhere classified     Problem List Patient Active Problem List   Diagnosis Date Noted  . Primary localized osteoarthritis of right knee 11/21/2018  . Primary osteoarthritis of right knee 11/21/2018  . Personal history of colonic adenoma 08/08/2013  . Family history of colon cancer 08/08/2013    Bess Harvest, PTA 01/03/19 1:59 PM   [facility] [address]  [place], [place], [address] Phone: [phone]   Fax:  [phone]  Name: Stacy Duncan MRN: [id number] Date of Birth: 1951-03-24

## 2019-01-04 ENCOUNTER — Ambulatory Visit: Payer: Medicare Other

## 2019-01-08 ENCOUNTER — Other Ambulatory Visit: Payer: Self-pay

## 2019-01-08 ENCOUNTER — Ambulatory Visit: Payer: Medicare Other

## 2019-01-08 DIAGNOSIS — M25661 Stiffness of right knee, not elsewhere classified: Secondary | ICD-10-CM

## 2019-01-08 DIAGNOSIS — M6281 Muscle weakness (generalized): Secondary | ICD-10-CM

## 2019-01-08 DIAGNOSIS — M25561 Pain in right knee: Secondary | ICD-10-CM

## 2019-01-08 DIAGNOSIS — R262 Difficulty in walking, not elsewhere classified: Secondary | ICD-10-CM

## 2019-01-08 NOTE — Therapy (Signed)
[facility] [address]  [place], [place], [address] Phone: [phone]   Fax:  [phone]  Physical Therapy Treatment  Patient Details  Name: Stacy Duncan MRN: [id number] Date of Birth: Jan 07, 1951 Referring Provider (PT): Melrose Nakayama, MD   Encounter Date: 01/08/2019  PT End of Session - 01/08/19 1059    Visit Number  12    Number of Visits  17    Date for PT Re-Evaluation  01/25/19    Authorization Type  Medicare & Federal BCBS    PT Start Time  1054    PT Stop Time  1140    PT Time Calculation (min)  46 min    Activity Tolerance  Patient tolerated treatment well;Patient limited by pain    Behavior During Therapy  [facility] for tasks assessed/performed       Past Medical History:  Diagnosis Date  . Cataract   . Depression   . Hypercholesteremia   . Hypertension     Past Surgical History:  Procedure Laterality Date  . ANKLE SURGERY Right   . BREAST LUMPECTOMY Left    mid 65s  . KNEE SURGERY Left   . 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  Subjective Assessment - 01/08/19 1057    Subjective  Pt. noting some L hip pain on Saturday and Sunday, "at the bone", which subsided today.      Pertinent History  HTN, HLD, depression, cataract, L knee surgery, L breast lumpectomy, R ankle surgery    How long can you sit comfortably?  1 hours    How long can you stand comfortably?  45 min     How long can you walk comfortably?  1 hours     Diagnostic tests  none recent    Patient Stated Goals  "get my balance and my bend back in my knee"    Currently in Pain?  Yes    Pain Score  3     Pain Location  Knee    Pain Orientation  Right    Pain Descriptors / Indicators  Aching    Pain Type  Acute pain;Surgical pain    Multiple Pain Sites   Yes    Pain Score  4    Pain Location  Knee    Pain Orientation  Left    Pain Descriptors / Indicators  Sharp    Pain Type  Chronic pain         [facility] PT Assessment - 01/08/19 0001      Assessment   Next MD Visit  01/12/19                   [facility] Adult PT Treatment/Exercise - 01/08/19 1116      Knee/Hip Exercises: Stretches   Passive Hamstring Stretch  Right;2 reps;30 seconds    Passive Hamstring Stretch Limitations  strap     Quad Stretch  Right;1 rep;60 seconds    Quad Stretch Limitations  strap     Gastroc Stretch  Right;1 rep;60 seconds    Gastroc Stretch Limitations  prostretch       Knee/Hip Exercises: Aerobic   Recumbent Bike  L2 x 6 min       Knee/Hip Exercises: Machines for Strengthening   Cybex Knee Flexion  B  LE's: 25# x 15 reps       Knee/Hip Exercises: Standing   Heel Raises  Both;20 reps    Heel Raises Limitations  counter top support    Knee Flexion  Right;Left;10 reps    Knee Flexion Limitations  2#    Forward Step Up  Right;Left;10 reps;Step Height: 8";Hand Hold: 1    Functional Squat  3 seconds;10 reps;2 sets    Functional Squat Limitations  to ~ 12" box with airex pad              PT Education - 01/08/19 1200    Education Details  HEP update    Person(s) Educated  Patient    Methods  Explanation;Demonstration;Verbal cues;Handout    Comprehension  Verbalized understanding;Returned demonstration;Need further instruction       PT Short Term Goals - 01/01/19 1105      PT SHORT TERM GOAL #1   Title  Patient to be independent with initial HEP.    Time  4    Period  Weeks    Status  Achieved    Target Date  12/28/18        PT Long Term Goals - 01/01/19 1105      PT LONG TERM GOAL #1   Title  Patient to be independent with advanced HEP.    Time  8    Period  Weeks    Status  Partially Met   met for current     PT LONG TERM GOAL #2   Title  Patient to demonstrate R knee AROM 0-120 degrees.     Time  8    Period   Weeks    Status  Partially Met   4-115 degrees R knee AROM, 3-120 degrees PROM     PT LONG TERM GOAL #3   Title  Patient to demonstrate B LE strength >=4+/5.     Time  8    Period  Weeks    Status  Partially Met   improvement in B hip flexion, L hip adduction, R knee flexion and extension, L knee extension, L plantarflexion strength; most limited in R adduction and B knee flexion strength     PT LONG TERM GOAL #4   Title  Patient to demonstrate symmetrical strep length, weight shift, and knee flexion with LRAD.    Time  8    Period  Weeks    Status  Partially Met   good heel strike and foot clearance on B feet, still with slightly decreased stance time on R LE d/t antalgia without SPC     PT LONG TERM GOAL #5   Title  Pt will report overall improvement in B knee pain by 90%.    Time  8    Period  Weeks    Status  Partially Met   reports 80% improvement in pain           Plan - 01/08/19 1100    Clinical Impression Statement  Pt. noting ~ 85% improvement in pain levels since starting therapy.  Reports she has been performing HEP almost daily taking Saturday off due to L hip pain which resolved.  Tolerated progression of stepping activities today well with increased forward step-up to 8" step height and progressed squat to ~ 14" box with pt. tolerating well.  Ended visit with pt. B knee pain returning to baseline thus modalities deferred.      Rehab Potential  Good    Clinical Impairments  Affecting Rehab Potential  HTN, HLD, depression, cataract, L knee surgery, L breast lumpectomy, R ankle surgery    PT Treatment/Interventions  ADLs/Self Care Home Management;Cryotherapy;Electrical Stimulation;Functional mobility training;Stair training;Gait training;DME Instruction;Ultrasound;Moist Heat;Therapeutic activities;Therapeutic exercise;Balance training;Neuromuscular re-education;Patient/family education;Orthotic Fit/Training;Passive range of motion;Scar mobilization;Manual techniques;Dry  needling;Energy conservation;Splinting;Taping;Vasopneumatic Device    PT Next Visit Plan  HS, adductor strengthening; address TKE    Consulted and Agree with Plan of Care  Patient       Patient will benefit from skilled therapeutic intervention in order to improve the following deficits and impairments:  Abnormal gait, Decreased endurance, Hypomobility, Increased edema, Decreased scar mobility, Decreased activity tolerance, Decreased strength, Pain, Difficulty walking, Decreased balance, Decreased range of motion, Postural dysfunction, Impaired flexibility, Improper body mechanics  Visit Diagnosis: Acute pain of right knee  Stiffness of right knee, not elsewhere classified  Muscle weakness (generalized)  Difficulty in walking, not elsewhere classified     Problem List Patient Active Problem List   Diagnosis Date Noted  . Primary localized osteoarthritis of right knee 11/21/2018  . Primary osteoarthritis of right knee 11/21/2018  . Personal history of colonic adenoma 08/08/2013  . Family history of colon cancer 08/08/2013    Bess Harvest, PTA 01/08/19 12:02 PM   [facility] [address]  [place], [place], [address] Phone: [phone]   Fax:  [phone]  Name: Stacy Duncan MRN: [id number] Date of Birth: 09/14/1951

## 2019-01-11 ENCOUNTER — Encounter: Payer: Self-pay | Admitting: Physical Therapy

## 2019-01-11 ENCOUNTER — Ambulatory Visit: Payer: Medicare Other | Admitting: Physical Therapy

## 2019-01-11 ENCOUNTER — Other Ambulatory Visit: Payer: Self-pay

## 2019-01-11 DIAGNOSIS — M25661 Stiffness of right knee, not elsewhere classified: Secondary | ICD-10-CM

## 2019-01-11 DIAGNOSIS — M6281 Muscle weakness (generalized): Secondary | ICD-10-CM

## 2019-01-11 DIAGNOSIS — M25561 Pain in right knee: Secondary | ICD-10-CM

## 2019-01-11 DIAGNOSIS — R262 Difficulty in walking, not elsewhere classified: Secondary | ICD-10-CM

## 2019-01-11 NOTE — Therapy (Signed)
[facility] [address]  [place], [place], [address] Phone: [phone]   Fax:  [phone]  Physical Therapy Treatment  Patient Details  Name: Stacy Duncan MRN: [id number] Date of Birth: 01-22-1951 Referring Provider (PT): Melrose Nakayama, MD   Encounter Date: 01/11/2019  PT End of Session - 01/11/19 1113    Visit Number  13    Number of Visits  17    Date for PT Re-Evaluation  01/25/19    Authorization Type  Medicare & Federal BCBS    PT Start Time  1100    PT Stop Time  1144    PT Time Calculation (min)  44 min    Activity Tolerance  Patient tolerated treatment well;Patient limited by pain    Behavior During Therapy  [facility] for tasks assessed/performed       Past Medical History:  Diagnosis Date  . Cataract   . Depression   . Hypercholesteremia   . Hypertension     Past Surgical History:  Procedure Laterality Date  . ANKLE SURGERY Right   . BREAST LUMPECTOMY Left    mid 30s  . KNEE SURGERY Left   . 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  Subjective Assessment - 01/11/19 1101    Subjective  Reports that she was struggling with allergies this AM.     Pertinent History  HTN, HLD, depression, cataract, L knee surgery, L breast lumpectomy, R ankle surgery    Diagnostic tests  none recent    Patient Stated Goals  "get my balance and my bend back in my knee"    Currently in Pain?  Yes    Pain Score  3     Pain Location  Knee    Pain Orientation  Right    Pain Descriptors / Indicators  Aching    Pain Type  Acute pain;Surgical pain    Pain Score  4    Pain Location  Knee    Pain Orientation  Left    Pain Descriptors / Indicators  Sharp    Pain Type  Chronic pain         [facility] PT Assessment - 01/11/19 0001      AROM    Right Knee Extension  3    Right Knee Flexion  122      PROM   Right Knee Extension  2    Right Knee Flexion  124      Strength   Right Hip Flexion  5/5    Right Hip ABduction  4+/5    Right Hip ADduction  4+/5    Left Hip Flexion  5/5    Left Hip ABduction  4+/5    Left Hip ADduction  4+/5    Right Knee Flexion  4+/5    Right Knee Extension  4+/5    Left Knee Flexion  4/5    Left Knee Extension  4+/5    Right Ankle Dorsiflexion  4+/5    Right Ankle Plantar Flexion  4+/5    Left Ankle Dorsiflexion  4+/5    Left Ankle Plantar Flexion  4+/5                   [facility] Adult PT Treatment/Exercise - 01/11/19 0001  Knee/Hip Exercises: Stretches   Passive Hamstring Stretch  Right;30 seconds;1 rep;Left    Passive Hamstring Stretch Limitations  strap       Knee/Hip Exercises: Aerobic   Recumbent Bike  L2 x 6 min       Knee/Hip Exercises: Machines for Strengthening   Cybex Knee Extension  B LE's: 15# 2x10     cues for slow eccentric lower   Cybex Knee Flexion  B LE's: 25# 2x10 reps     Cybex Leg Press  B LEs 20# x 10; R LE 15# x10      Knee/Hip Exercises: Standing   Functional Squat  1 set;15 reps    Functional Squat Limitations  TRX squat + heel raise   cues to widen stance   Other Standing Knee Exercises  R & L SLS + trunk rotation with red TB x10 each side; requiring toe tap on opposite foot for balance   cues to slow down      Knee/Hip Exercises: Supine   Bridges  Strengthening;Both;1 set;10 reps    Bridges Limitations  HS bridge             PT Education - 01/11/19 1146    Education Details  update to [employer]) Educated  Patient    Methods  Explanation;Demonstration;Tactile cues;Verbal cues;Handout    Comprehension  Verbalized understanding;Returned demonstration       PT Short Term Goals - 01/11/19 1115      PT SHORT TERM GOAL #1   Title  Patient to be independent with initial HEP.    Time  4    Period  Weeks    Status  Achieved     Target Date  12/28/18        PT Long Term Goals - 01/11/19 1115      PT LONG TERM GOAL #1   Title  Patient to be independent with advanced HEP.    Time  8    Period  Weeks    Status  Partially Met   met for current     PT LONG TERM GOAL #2   Title  Patient to demonstrate R knee AROM 0-120 degrees.     Time  8    Period  Weeks    Status  Partially Met   3-122 degrees R knee AROM, 2-124 degrees PROM     PT LONG TERM GOAL #3   Title  Patient to demonstrate B LE strength >=4+/5.     Time  8    Period  Weeks    Status  Partially Met   improvement in R hip adduction and knee flexion; most limited in L knee flexion strength     PT LONG TERM GOAL #4   Title  Patient to demonstrate symmetrical strep length, weight shift, and knee flexion with LRAD.    Time  8    Period  Weeks    Status  Partially Met   good heel strike and foot clearance on B feet, still with slightly decreased stance time on R LE d/t antalgia without SPC     PT LONG TERM GOAL #5   Title  Pt will report overall improvement in B knee pain by 90%.    Time  8    Period  Weeks    Status  Partially Met   reports 85% improvement in pain           Plan - 01/11/19 1150  Clinical Impression Statement  Patient arrived to session with no new complaints. Patient now reaching 3-122 degrees R knee AROM, 2-124 degrees PROM. Strength testing revealed improvement in R hip adduction and knee flexion; most limited in L knee flexion strength. Patient reports 85% improvement in pain in R knee. Still limited in her walking and standing tolerance as well as having pain after prolonged sitting. Able to increase weighted resistance with machine LE strengthening this session with patient requiring cues for slow eccentric lowering and control. Patient somewhat limited by L knee pain. Tolerated TRX squats to about 90 degrees with heel raise- intermittent LOB d/t patient reporting that she placed a heel lift in her L shoe d/t  chiropractor advising her she has a leg length discrepancy. Patient with report of muscle burn after ther-ex, but no complaints of pain at end of session.     Clinical Impairments Affecting Rehab Potential  HTN, HLD, depression, cataract, L knee surgery, L breast lumpectomy, R ankle surgery    PT Treatment/Interventions  ADLs/Self Care Home Management;Cryotherapy;Electrical Stimulation;Functional mobility training;Stair training;Gait training;DME Instruction;Ultrasound;Moist Heat;Therapeutic activities;Therapeutic exercise;Balance training;Neuromuscular re-education;Patient/family education;Orthotic Fit/Training;Passive range of motion;Scar mobilization;Manual techniques;Dry needling;Energy conservation;Splinting;Taping;Vasopneumatic Device    PT Next Visit Plan  HS, adductor strengthening; address TKE    Consulted and Agree with Plan of Care  Patient       Patient will benefit from skilled therapeutic intervention in order to improve the following deficits and impairments:  Abnormal gait, Decreased endurance, Hypomobility, Increased edema, Decreased scar mobility, Decreased activity tolerance, Decreased strength, Pain, Difficulty walking, Decreased balance, Decreased range of motion, Postural dysfunction, Impaired flexibility, Improper body mechanics  Visit Diagnosis: Acute pain of right knee  Stiffness of right knee, not elsewhere classified  Muscle weakness (generalized)  Difficulty in walking, not elsewhere classified     Problem List Patient Active Problem List   Diagnosis Date Noted  . Primary localized osteoarthritis of right knee 11/21/2018  . Primary osteoarthritis of right knee 11/21/2018  . Personal history of colonic adenoma 08/08/2013  . Family history of colon cancer 08/08/2013     Janene Harvey, PT, DPT 01/11/19 11:55 AM   [facility] [address]  [place], [place], [address] Phone: [phone]    Fax:  [phone]  Name: Stacy Duncan MRN: [id number] Date of Birth: 1951-08-13

## 2019-01-15 ENCOUNTER — Ambulatory Visit: Payer: Medicare Other

## 2019-01-18 ENCOUNTER — Other Ambulatory Visit: Payer: Self-pay

## 2019-01-18 ENCOUNTER — Ambulatory Visit: Payer: Medicare Other

## 2019-01-18 ENCOUNTER — Encounter: Payer: Federal, State, Local not specified - PPO | Admitting: Physical Therapy

## 2019-01-18 DIAGNOSIS — M25561 Pain in right knee: Secondary | ICD-10-CM

## 2019-01-18 DIAGNOSIS — R262 Difficulty in walking, not elsewhere classified: Secondary | ICD-10-CM

## 2019-01-18 DIAGNOSIS — M25661 Stiffness of right knee, not elsewhere classified: Secondary | ICD-10-CM

## 2019-01-18 DIAGNOSIS — M6281 Muscle weakness (generalized): Secondary | ICD-10-CM

## 2019-01-18 NOTE — Therapy (Signed)
[facility] [address]  [place], [place], [address] Phone: [phone]   Fax:  [phone]  Physical Therapy Treatment  Patient Details  Name: Stacy Duncan MRN: [id number] Date of Birth: [birth year] Referring Provider (PT): Melrose Nakayama, MD   Encounter Date: 01/18/2019  PT End of Session - 01/18/19 1112    Visit Number  14    Number of Visits  17    Date for PT Re-Evaluation  01/25/19    Authorization Type  Medicare & Federal BCBS    PT Start Time  1101    PT Stop Time  1139    PT Time Calculation (min)  38 min    Activity Tolerance  Patient tolerated treatment well;Patient limited by pain    Behavior During Therapy  [facility] for tasks assessed/performed       Past Medical History:  Diagnosis Date  . Cataract   . Depression   . Hypercholesteremia   . Hypertension     Past Surgical History:  Procedure Laterality Date  . ANKLE SURGERY Right   . BREAST LUMPECTOMY Left    mid 73s  . KNEE SURGERY Left   . 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  Subjective Assessment - 01/18/19 1112    Subjective  Pt. doing well.      Pertinent History  HTN, HLD, depression, cataract, L knee surgery, L breast lumpectomy, R ankle surgery    Patient Stated Goals  "get my balance and my bend back in my knee"    Currently in Pain?  Yes    Pain Score  5     Pain Location  Knee    Pain Orientation  Right    Pain Descriptors / Indicators  Aching    Pain Type  Acute pain;Surgical pain    Multiple Pain Sites  No                       [facility] Adult PT Treatment/Exercise - 01/18/19 1116      Knee/Hip Exercises: Stretches   Gastroc Stretch  Right;1 rep;60 seconds    Gastroc Stretch Limitations  prostretch       Knee/Hip Exercises:  Aerobic   Recumbent Bike  L2 x 7 min       Knee/Hip Exercises: Machines for Strengthening   Cybex Knee Extension  B LE's: 20# x 15 reps       Cybex Knee Flexion  B LE's: 30# 2 x 15 reps       Knee/Hip Exercises: Standing   Knee Flexion  Right;Left;15 reps    Knee Flexion Limitations  2#; 2 ski poles    Terminal Knee Extension  Right;1 set;15 reps;Theraband    Theraband Level (Terminal Knee Extension)  Level 4 (Blue)    Terminal Knee Extension Limitations  + step up to 6" step with 2 ski poles     Forward Step Up  Right;15 reps;Hand Hold: 2;Step Height: 6"    Forward Step Up Limitations  blue TB TKE    Step Down  15 reps;Right;Hand Hold: 2;Step Height: 4"    Step Down Limitations  at machine      Knee/Hip Exercises: Seated   Sit to Sand  10 reps;with UE support   from low box ~  13"      Knee/Hip Exercises: Supine   Bridges with Ball Squeeze  Both;15 reps;Strengthening   5" hold               PT Short Term Goals - 01/11/19 1115      PT SHORT TERM GOAL #1   Title  Patient to be independent with initial HEP.    Time  4    Period  Weeks    Status  Achieved    Target Date  12/28/18        PT Long Term Goals - 01/11/19 1115      PT LONG TERM GOAL #1   Title  Patient to be independent with advanced HEP.    Time  8    Period  Weeks    Status  Partially Met   met for current     PT LONG TERM GOAL #2   Title  Patient to demonstrate R knee AROM 0-120 degrees.     Time  8    Period  Weeks    Status  Partially Met   3-122 degrees R knee AROM, 2-124 degrees PROM     PT LONG TERM GOAL #3   Title  Patient to demonstrate B LE strength >=4+/5.     Time  8    Period  Weeks    Status  Partially Met   improvement in R hip adduction and knee flexion; most limited in L knee flexion strength     PT LONG TERM GOAL #4   Title  Patient to demonstrate symmetrical strep length, weight shift, and knee flexion with LRAD.    Time  8    Period  Weeks    Status  Partially Met    good heel strike and foot clearance on B feet, still with slightly decreased stance time on R LE d/t antalgia without SPC     PT LONG TERM GOAL #5   Title  Pt will report overall improvement in B knee pain by 90%.    Time  8    Period  Weeks    Status  Partially Met   reports 85% improvement in pain           Plan - 01/18/19 1113    Clinical Impression Statement  Pt. reporting she was able to mow yard yesterday without knee pain.  Session focused on R HS, hip adductor and TKE strengthening activities which was tolerated well.  Focused on machine strengthening per pt. request today.  Ended visit pain free.  Pt. with visible improvement in R eccentric control with 4" eccentric heel touch today.  Progressing well toward goals.      Rehab Potential  Good    Clinical Impairments Affecting Rehab Potential  HTN, HLD, depression, cataract, L knee surgery, L breast lumpectomy, R ankle surgery    PT Treatment/Interventions  ADLs/Self Care Home Management;Cryotherapy;Electrical Stimulation;Functional mobility training;Stair training;Gait training;DME Instruction;Ultrasound;Moist Heat;Therapeutic activities;Therapeutic exercise;Balance training;Neuromuscular re-education;Patient/family education;Orthotic Fit/Training;Passive range of motion;Scar mobilization;Manual techniques;Dry needling;Energy conservation;Splinting;Taping;Vasopneumatic Device    PT Next Visit Plan  HS, adductor strengthening; address TKE    Consulted and Agree with Plan of Care  Patient       Patient will benefit from skilled therapeutic intervention in order to improve the following deficits and impairments:  Abnormal gait, Decreased endurance, Hypomobility, Increased edema, Decreased scar mobility, Decreased activity tolerance, Decreased strength, Pain, Difficulty walking, Decreased balance, Decreased range of motion, Postural dysfunction, Impaired flexibility, Improper body mechanics  Visit Diagnosis: Acute pain of right  knee  Stiffness of right knee, not elsewhere classified  Muscle weakness (generalized)  Difficulty in walking, not elsewhere classified     Problem List Patient Active Problem List   Diagnosis Date Noted  . Primary localized osteoarthritis of right knee 11/21/2018  . Primary osteoarthritis of right knee 11/21/2018  . Personal history of colonic adenoma 08/08/2013  . Family history of colon cancer 08/08/2013    Bess Harvest, PTA 01/18/19 11:41 AM    [facility] [address]  [place], [place], [address] Phone: [phone]   Fax:  [phone]  Name: Lashundra Shiveley MRN: [id number] Date of Birth: 10/30/1950

## 2019-01-23 ENCOUNTER — Other Ambulatory Visit: Payer: Self-pay

## 2019-01-23 ENCOUNTER — Ambulatory Visit: Payer: Medicare Other

## 2019-01-23 DIAGNOSIS — M25661 Stiffness of right knee, not elsewhere classified: Secondary | ICD-10-CM

## 2019-01-23 DIAGNOSIS — M6281 Muscle weakness (generalized): Secondary | ICD-10-CM

## 2019-01-23 DIAGNOSIS — R262 Difficulty in walking, not elsewhere classified: Secondary | ICD-10-CM

## 2019-01-23 DIAGNOSIS — M25561 Pain in right knee: Secondary | ICD-10-CM

## 2019-01-23 NOTE — Therapy (Addendum)
[facility] [address]  [address] [place], [place], [address] Phone: [phone]   Fax:  [phone]  Physical Therapy Treatment  Patient Details  Name: Stacy Duncan MRN: [id number] Date of Birth: 06/18/1951 Referring Provider (PT): Peter Dalldorf, MD   Progress Note Reporting Period 01/03/19 to 01/23/19  See note below for Objective Data and Assessment of Progress/Goals.    Encounter Date: 01/23/2019  PT End of Session - 01/23/19 1108    Visit Number  15    Number of Visits  17    Date for PT Re-Evaluation  01/25/19    Authorization Type  Medicare & Federal BCBS    PT Start Time  1100    PT Stop Time  1145    PT Time Calculation (min)  45 min    Activity Tolerance  Patient tolerated treatment well;Patient limited by pain    Behavior During Therapy  [facility] for tasks assessed/performed       Past Medical History:  Diagnosis Date  . Cataract   . Depression   . Hypercholesteremia   . Hypertension     Past Surgical History:  Procedure Laterality Date  . ANKLE SURGERY Right   . BREAST LUMPECTOMY Left    mid 70s  . KNEE SURGERY Left   . 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      [facility] PT Assessment - 01/23/19 0001      AROM   Right/Left Knee  Right    Right Knee Extension  3    Right Knee Flexion  125      PROM   Right/Left Knee  Right    Right Knee Extension  2    Right Knee Flexion  128      Strength   Right/Left Hip  Right;Left    Right Hip Flexion  5/5    Right Hip ABduction  4+/5    Right Hip ADduction  4+/5    Left Hip Flexion  5/5    Left Hip ABduction  4+/5    Left Hip ADduction  4+/5    Right/Left Knee  Right;Left    Right Knee Flexion  4+/5    Right Knee Extension  4+/5    Left Knee Flexion  4+/5    Left  Knee Extension  4+/5    Right Ankle Dorsiflexion  4+/5    Right Ankle Plantar Flexion  4+/5    Left Ankle Dorsiflexion  4+/5    Left Ankle Plantar Flexion  4+/5                   [facility] Adult PT Treatment/Exercise - 01/23/19 0001      Self-Care   Self-Care  Other Self-Care Comments    Other Self-Care Comments   final review and updated of comprehensive HEP with updated HEP issued to pt. via handout       Knee/Hip Exercises: Stretches   Passive Hamstring Stretch  Right;30 seconds;1 rep    Passive Hamstring Stretch Limitations  strap       Knee/Hip Exercises: Aerobic   Recumbent Bike  L2 x 7 min       Knee/Hip Exercises: Supine   Bridges with Ball Squeeze  Both;15 reps;Strengthening               PT Education - 01/23/19 1254    Education Details  HEP update; consolidated HEP issued to pt. via handout    Person(s) Educated  Patient    Methods  Explanation;Verbal cues;Handout    Comprehension  Verbalized understanding;Verbal cues required       PT Short Term Goals - 01/11/19 1115      PT SHORT TERM GOAL #1   Title  Patient to be independent with initial HEP.    Time  4    Period  Weeks    Status  Achieved    Target Date  12/28/18        PT Long Term Goals - 01/23/19 1107      PT LONG TERM GOAL #1   Title  Patient to be independent with advanced HEP.    Time  8    Period  Weeks    Status  Achieved      PT LONG TERM GOAL #2   Title  Patient to demonstrate R knee AROM 0-120 degrees.     Time  8    Period  Weeks    Status  Partially Met   3-125 degrees R knee AROM, 2-128 degrees PROM     PT LONG TERM GOAL #3   Title  Patient to demonstrate B LE strength >=4+/5.     Time  8    Period  Weeks    Status  Achieved      PT LONG TERM GOAL #4   Title  Patient to demonstrate symmetrical strep length, weight shift, and knee flexion with LRAD.    Time  8    Period  Weeks    Status  Partially Met   01/23/19: good heel strike and foot clearance on B  feet, still with slightly decreased stance time on R LE      PT LONG TERM GOAL #5   Title  Pt will report overall improvement in B knee pain by 90%.    Time  8    Period  Weeks    Status  Achieved            Plan - 01/23/19 1108    Clinical Impression Statement  Pt. has made good progress with therapy able to partially achieve or full achieve all LTG's.  Able to demo R knee PROM 2-128 dg, AROM 3-125 dg today.  Able to achieve LTG #3 meeting 4+/5 or greater strength with MMT on B LE.  Able to partially achieve gait goal now ambulating without AD and nearly symmetrical gait pattern with exception of slight decreased stance time on R LE.  Abe to achieve LTG #5 today reporting 90% improvement in B knee pain since starting therapy.  Pt. made aware she is nearing end of current POC and pt. opting for 30-day hold from therapy.  Supervising PT approving plan for 30-day hold thus updated/consolidated existing HEP with pt. verbalizing understanding and issued handout.  Pt. now on hold from therapy.      Rehab Potential  Good    Clinical Impairments Affecting Rehab Potential  HTN, HLD, depression, cataract, L knee surgery, L breast lumpectomy, R ankle surgery    PT Treatment/Interventions  ADLs/Self Care Home Management;Cryotherapy;Electrical Stimulation;Functional mobility training;Stair training;Gait training;DME Instruction;Ultrasound;Moist Heat;Therapeutic activities;Therapeutic exercise;Balance training;Neuromuscular re-education;Patient/family education;Orthotic Fit/Training;Passive range of motion;Scar mobilization;Manual techniques;Dry needling;Energy conservation;Splinting;Taping;Vasopneumatic Device    PT Next Visit Plan  30-day hold     Consulted and Agree with Plan of Care  Patient  Patient will benefit from skilled therapeutic intervention in order to improve the following deficits and impairments:  Abnormal gait, Decreased endurance, Hypomobility, Increased edema, Decreased scar  mobility, Decreased activity tolerance, Decreased strength, Pain, Difficulty walking, Decreased balance, Decreased range of motion, Postural dysfunction, Impaired flexibility, Improper body mechanics  Visit Diagnosis: Acute pain of right knee  Stiffness of right knee, not elsewhere classified  Muscle weakness (generalized)  Difficulty in walking, not elsewhere classified     Problem List Patient Active Problem List   Diagnosis Date Noted  . Primary localized osteoarthritis of right knee 11/21/2018  . Primary osteoarthritis of right knee 11/21/2018  . Personal history of colonic adenoma 08/08/2013  . Family history of colon cancer 08/08/2013    Bess Harvest, PTA 01/23/19 1:02 PM   [facility] [address]  [place], [place], [address] Phone: [phone]   Fax:  [phone]  Name: Rodnesha Elie MRN: [id number] Date of Birth: September 26, 1951  PHYSICAL THERAPY DISCHARGE SUMMARY  Visits from Start of Care: 15  Current functional level related to goals / functional outcomes: See above clinical impression; patient pleased with progress after being placed on 30 day hold   Remaining deficits: Decreased ROM, gait deviations   Education / Equipment: HEP  Plan: Patient agrees to discharge.  Patient goals were partially met. Patient is being discharged due to being pleased with the current functional level.  ?????     Janene Harvey, PT, DPT 03/28/19 1:42 PM

## 2019-01-25 ENCOUNTER — Ambulatory Visit: Payer: Federal, State, Local not specified - PPO

## 2019-01-25 ENCOUNTER — Encounter: Payer: Federal, State, Local not specified - PPO | Admitting: Physical Therapy

## 2019-01-30 ENCOUNTER — Encounter: Payer: Self-pay | Admitting: Physical Therapy

## 2019-04-24 ENCOUNTER — Encounter: Payer: Self-pay | Admitting: Internal Medicine

## 2019-05-16 ENCOUNTER — Ambulatory Visit: Payer: Federal, State, Local not specified - PPO | Admitting: *Deleted

## 2019-05-16 ENCOUNTER — Other Ambulatory Visit: Payer: Self-pay

## 2019-05-16 VITALS — Ht 65.0 in | Wt 202.0 lb

## 2019-05-16 DIAGNOSIS — Z86010 Personal history of colonic polyps: Secondary | ICD-10-CM

## 2019-05-16 DIAGNOSIS — Z800 Family history of malignant neoplasm of digestive organs: Secondary | ICD-10-CM

## 2019-05-16 NOTE — Progress Notes (Signed)
Patient's pre-visit was done today over the phone with the patient due to COVID-19 pandemic. Name,DOB and address verified. Insurance verified. Packet of Prep instructions mailed to patient including copy of a consent form and pre-procedure patient acknowledgement form-pt is aware.  Patient understands to call us back with any questions or concerns.  Patient denies any allergies to eggs or soy. Patient denies any problems with anesthesia/sedation. Patient denies any oxygen use at home. Patient denies taking any diet/weight loss medications or blood thinners. Pt is aware that care partner will wait in the car during procedure; if they feel like they will be too hot to wait in the car; they may wait in the lobby.  We want them to wear a mask (we do not have any that we can provide them), practice social distancing, and we will check their temperatures when they get here.  I did remind patient that their care partner needs to stay in the parking lot the entire time. Pt will wear mask into building. 

## 2019-05-29 ENCOUNTER — Telehealth: Payer: Self-pay | Admitting: Internal Medicine

## 2019-05-29 NOTE — Telephone Encounter (Signed)

## 2019-05-29 NOTE — Telephone Encounter (Signed)
Pt responded "no" to all screening questions °

## 2019-05-30 ENCOUNTER — Encounter: Payer: Self-pay | Admitting: Internal Medicine

## 2019-05-30 ENCOUNTER — Ambulatory Visit (AMBULATORY_SURGERY_CENTER): Payer: Medicare Other | Admitting: Internal Medicine

## 2019-05-30 ENCOUNTER — Other Ambulatory Visit: Payer: Self-pay

## 2019-05-30 VITALS — BP 155/77 | HR 56 | Temp 98.7°F | Resp 18 | Ht 65.0 in | Wt 202.0 lb

## 2019-05-30 DIAGNOSIS — Z1211 Encounter for screening for malignant neoplasm of colon: Secondary | ICD-10-CM

## 2019-05-30 DIAGNOSIS — D123 Benign neoplasm of transverse colon: Secondary | ICD-10-CM | POA: Diagnosis not present

## 2019-05-30 DIAGNOSIS — Z86010 Personal history of colonic polyps: Secondary | ICD-10-CM

## 2019-05-30 MED ORDER — SODIUM CHLORIDE 0.9 % IV SOLN: 500.0000 mL | Freq: Once | INTRAVENOUS | Status: DC

## 2019-05-30 NOTE — Patient Instructions (Addendum)
I found and removed 2 tiny polyps. I will let you know pathology results and when to have another routine colonoscopy by mail and/or My Chart.   I appreciate the opportunity to care for you. Gatha Mayer, MD, FACG   YOU HAD AN ENDOSCOPIC PROCEDURE TODAY AT [facility]:   Refer to the procedure report that was given to you for any specific questions about what was found during the examination.  If the procedure report does not answer your questions, please call your gastroenterologist to clarify.  If you requested that your care partner not be given the details of your procedure findings, then the procedure report has been included in a sealed envelope for you to review at your convenience later.  YOU SHOULD EXPECT: Some feelings of bloating in the abdomen. Passage of more gas than usual.  Walking can help get rid of the air that was put into your GI tract during the procedure and reduce the bloating. If you had a lower endoscopy (such as a colonoscopy or flexible sigmoidoscopy) you may notice spotting of blood in your stool or on the toilet paper. If you underwent a bowel prep for your procedure, you may not have a normal bowel movement for a few days.  Please Note:  You might notice some irritation and congestion in your nose or some drainage.  This is from the oxygen used during your procedure.  There is no need for concern and it should clear up in a day or so.  SYMPTOMS TO REPORT IMMEDIATELY:   Following lower endoscopy (colonoscopy or flexible sigmoidoscopy):  Excessive amounts of blood in the stool  Significant tenderness or worsening of abdominal pains  Swelling of the abdomen that is new, acute  Fever of 100F or higher  For urgent or emergent issues, a gastroenterologist can be reached at any hour by calling [phone].   DIET:  We do recommend a small meal at first, but then you may proceed to your regular diet.  Drink plenty of fluids but you  should avoid alcoholic beverages for 24 hours.  ACTIVITY:  You should plan to take it easy for the rest of today and you should NOT DRIVE or use heavy machinery until tomorrow (because of the sedation medicines used during the test).    FOLLOW UP: Our staff will call the number listed on your records 48-72 hours following your procedure to check on you and address any questions or concerns that you may have regarding the information given to you following your procedure. If we do not reach you, we will leave a message.  We will attempt to reach you two times.  During this call, we will ask if you have developed any symptoms of COVID 19. If you develop any symptoms (ie: fever, flu-like symptoms, shortness of breath, cough etc.) before then, please call [phone].  If you test positive for Covid 19 in the 2 weeks post procedure, please call and report this information to [place].    If any biopsies were taken you will be contacted by phone or by letter within the next 1-3 weeks.  Please call us at [phone] if you have not heard about the biopsies in 3 weeks.    SIGNATURES/CONFIDENTIALITY: You and/or your care partner have signed paperwork which will be entered into your electronic medical record.  These signatures attest to the fact that that the information above on your After Visit Summary has been reviewed and is understood.  Full responsibility of the confidentiality of this discharge information lies with you and/or your care-partner.

## 2019-05-30 NOTE — Progress Notes (Signed)
Pt's states no medical or surgical changes since previsit or office visit. 

## 2019-05-30 NOTE — Op Note (Signed)
[facility] Patient Name: Stacy Duncan Procedure Date: 05/30/2019 9:08 AM MRN: [id number] Endoscopist: Gatha Mayer , MD Age: 68 Referring MD:  Date of Birth: Nov 28, 1950 Gender: Female Account #: [id number] Procedure:                Colonoscopy Indications:              Surveillance: Personal history of colonic polyps                            (unknown histology) on last colonoscopy more than 5                            years ago Medicines:                Propofol per Anesthesia, Monitored Anesthesia Care Procedure:                Pre-Anesthesia Assessment:                           - Prior to the procedure, a History and Physical                            was performed, and patient medications and                            allergies were reviewed. The patient's tolerance of                            previous anesthesia was also reviewed. The risks                            and benefits of the procedure and the sedation                            options and risks were discussed with the patient.                            All questions were answered, and informed consent                            was obtained. Prior Anticoagulants: The patient has                            taken no previous anticoagulant or antiplatelet                            agents. ASA Grade Assessment: II - A patient with                            mild systemic disease. After reviewing the risks                            and benefits, the patient was deemed in  satisfactory condition to undergo the procedure.                           After obtaining informed consent, the colonoscope                            was passed under direct vision. Throughout the                            procedure, the patient's blood pressure, pulse, and                            oxygen saturations were monitored continuously. The                            Colonoscope was introduced  through the anus and                            advanced to the the cecum, identified by                            appendiceal orifice and ileocecal valve. The                            colonoscopy was performed without difficulty. The                            patient tolerated the procedure well. The quality                            of the bowel preparation was excellent. The bowel                            preparation used was Miralax via split dose                            instruction. The ileocecal valve, appendiceal                            orifice, and rectum were photographed. Scope In: 9:21:56 AM Scope Out: 9:37:22 AM Scope Withdrawal Time: 0 hours 9 minutes 52 seconds  Total Procedure Duration: 0 hours 15 minutes 26 seconds  Findings:                 The perianal and digital rectal examinations were                            normal.                           Two sessile polyps were found in the transverse                            colon. The polyps were diminutive in size. These  polyps were removed with a cold snare. Resection                            and retrieval were complete. Verification of                            patient identification for the specimen was done.                            Estimated blood loss was minimal.                           The exam was otherwise without abnormality on                            direct and retroflexion views. Complications:            No immediate complications. Estimated Blood Loss:     Estimated blood loss was minimal. Impression:               - Two diminutive polyps in the transverse colon,                            removed with a cold snare. Resected and retrieved.                           - The examination was otherwise normal on direct                            and retroflexion views.                           - Personal history of colonic polyp diminiutive                             adenoma 2014 - also elderly mom had colon cancer. Recommendation:           - Patient has a contact number available for                            emergencies. The signs and symptoms of potential                            delayed complications were discussed with the                            patient. Return to normal activities tomorrow.                            Written discharge instructions were provided to the                            patient.                           - Resume previous  diet.                           - Continue present medications.                           - Repeat colonoscopy is recommended for                            surveillance. The colonoscopy date will be                            determined after pathology results from today's                            exam become available for review. Gatha Mayer, MD 05/30/2019 9:44:05 AM This report has been signed electronically.

## 2019-05-30 NOTE — Progress Notes (Signed)
To PACU, VSS. Report to [email or web address] 

## 2019-05-30 NOTE — Progress Notes (Signed)
[place]

## 2019-06-01 ENCOUNTER — Telehealth: Payer: Self-pay

## 2019-06-01 NOTE — Telephone Encounter (Signed)
Left voice mail

## 2019-06-01 NOTE — Telephone Encounter (Signed)
  Follow up Call-  Call back number 05/30/2019  Post procedure Call Back phone  # [phone]  Permission to leave phone message Yes  Some recent data might be hidden     Left message

## 2019-06-03 ENCOUNTER — Encounter: Payer: Self-pay | Admitting: Internal Medicine

## 2019-06-03 DIAGNOSIS — Z86010 Personal history of colonic polyps: Secondary | ICD-10-CM

## 2019-06-03 NOTE — Progress Notes (Signed)
2 diminutive adenomas + FHx CRCA elderly mom Recall 2025

## 2019-06-28 ENCOUNTER — Other Ambulatory Visit: Payer: Self-pay | Admitting: Orthopaedic Surgery

## 2019-07-30 NOTE — Patient Instructions (Addendum)
DUE TO COVID-19 ONLY ONE VISITOR IS ALLOWED TO COME WITH YOU AND STAY IN THE WAITING ROOM ONLY DURING PRE OP AND PROCEDURE DAY OF SURGERY. THE 1 VISITOR MAY VISIT WITH YOU AFTER SURGERY IN YOUR PRIVATE ROOM DURING VISITING HOURS ONLY!  YOU NEED TO HAVE A COVID 19 TEST ON_10/9______ @__10 :45_____, THIS TEST MUST BE DONE BEFORE SURGERY, COME  [name] , [address].  ([place]) ONCE YOUR COVID TEST IS COMPLETED, PLEASE BEGIN THE QUARANTINE INSTRUCTIONS AS OUTLINED IN YOUR HANDOUT.                Stacy Duncan    Your procedure is scheduled on: 08/07/19   Report to [facility] Main  Entrance   Report to Short Stay at  5:30 AM     Call this number if you have problems the morning of surgery [place], NO CHEWING GUM [place].   Do not eat food After Midnight.   YOU MAY HAVE CLEAR LIQUIDS FROM MIDNIGHT UNTIL 4:30AM  . At 4:30AM Please finish the prescribed Pre-Surgery  drink.   Nothing by mouth after you finish the  drink !   Take these medicines the morning of surgery with A SIP OF WATER: Lexipro, Nasonex if needed                                 You may not have any metal on your body including hair pins and              piercings              Do not wear jewelry, make-up, lotions, powders or perfumes, deodorant             Do not wear nail polish on your fingernails.  Do not shave  48 hours prior to surgery.     Do not bring valuables to the hospital. [place].  Contacts, dentures or bridgework may not be worn into surgery.     Name and phone number of your driver:  Special Instructions: N/A              Please read over the following fact sheets you were given: _____________________________________________________________________             [facility] - Preparing for Surgery  Before surgery, you can  play an important role.   Because skin is not sterile, your skin needs to be as free of germs as possible.   You can reduce the number of germs on your skin by washing with CHG (chlorahexidine gluconate) soap before surgery.   CHG is an antiseptic cleaner which kills germs and bonds with the skin to continue killing germs even after washing. Please DO NOT use if you have an allergy to CHG or antibacterial soaps.   If your skin becomes reddened/irritated stop using the CHG and inform your nurse when you arrive at Short Stay. Do not shave (including legs and underarms) for at least 48 hours prior to the first CHG shower.    Please follow these instructions carefully:  1.  Shower with CHG Soap the night before surgery and the  morning of Surgery.  2.  If  you choose to wash your hair, wash your hair first as usual with your  normal  shampoo.  3.  After you shampoo, rinse your hair and body thoroughly to remove the  shampoo.                                        4.  Use CHG as you would any other liquid soap.  You can apply chg directly  to the skin and wash                       Gently with a scrungie or clean washcloth.  5.  Apply the CHG Soap to your body ONLY FROM THE NECK DOWN.   Do not use on face/ open                           Wound or open sores. Avoid contact with eyes, ears mouth and genitals (private parts).                       Wash face,  Genitals (private parts) with your normal soap.             6.  Wash thoroughly, paying special attention to the area where your surgery  will be performed.  7.  Thoroughly rinse your body with warm water from the neck down.  8.  DO NOT shower/wash with your normal soap after using and rinsing off  the CHG Soap.                9.  Pat yourself dry with a clean towel.            10.  Wear clean pajamas.            11.  Place clean sheets on your bed the night of your first shower and do not  sleep with pets. Day of Surgery : Do not apply any  lotions/deodorants the morning of surgery.  Please wear clean clothes to the hospital/surgery center.  FAILURE TO FOLLOW THESE INSTRUCTIONS MAY RESULT IN THE CANCELLATION OF YOUR SURGERY PATIENT SIGNATURE_________________________________  NURSE SIGNATURE__________________________________  ________________________________________________________________________   Adam Phenix  An incentive spirometer is a tool that can help keep your lungs clear and active. This tool measures how well you are filling your lungs with each breath. Taking long deep breaths may help reverse or decrease the chance of developing breathing (pulmonary) problems (especially infection) following:  A long period of time when you are unable to move or be active. BEFORE THE PROCEDURE   If the spirometer includes an indicator to show your best effort, your nurse or respiratory therapist will set it to a desired goal.  If possible, sit up straight or lean slightly forward. Try not to slouch.  Hold the incentive spirometer in an upright position. INSTRUCTIONS FOR USE  1. Sit on the edge of your bed if possible, or sit up as far as you can in bed or on a chair. 2. Hold the incentive spirometer in an upright position. 3. Breathe out normally. 4. Place the mouthpiece in your mouth and seal your lips tightly around it. 5. Breathe in slowly and as deeply as possible, raising the piston or the ball toward the top of the column. 6. Hold your breath for 3-5 seconds  or for as long as possible. Allow the piston or ball to fall to the bottom of the column. 7. Remove the mouthpiece from your mouth and breathe out normally. 8. Rest for a few seconds and repeat Steps 1 through 7 at least 10 times every 1-2 hours when you are awake. Take your time and take a few normal breaths between deep breaths. 9. The spirometer may include an indicator to show your best effort. Use the indicator as a goal to work toward during each  repetition. 10. After each set of 10 deep breaths, practice coughing to be sure your lungs are clear. If you have an incision (the cut made at the time of surgery), support your incision when coughing by placing a pillow or rolled up towels firmly against it. Once you are able to get out of bed, walk around indoors and cough well. You may stop using the incentive spirometer when instructed by your caregiver.  RISKS AND COMPLICATIONS  Take your time so you do not get dizzy or light-headed.  If you are in pain, you may need to take or ask for pain medication before doing incentive spirometry. It is harder to take a deep breath if you are having pain. AFTER USE  Rest and breathe slowly and easily.  It can be helpful to keep track of a log of your progress. Your caregiver can provide you with a simple table to help with this. If you are using the spirometer at home, follow these instructions: [place] IF:   You are having difficultly using the spirometer.  You have trouble using the spirometer as often as instructed.  Your pain medication is not giving enough relief while using the spirometer.  You develop fever of 100.5 F (38.1 C) or higher. SEEK IMMEDIATE MEDICAL CARE IF:   You cough up bloody sputum that had not been present before.  You develop fever of 102 F (38.9 C) or greater.  You develop worsening pain at or near the incision site. MAKE SURE YOU:   Understand these instructions.  Will watch your condition.  Will get help right away if you are not doing well or get worse. Document Released: 02/21/2007 Document Revised: 01/03/2012 Document Reviewed: 04/24/2007 [facility] Patient Information 2014 [place], [place].   ________________________________________________________________________

## 2019-07-31 ENCOUNTER — Encounter (HOSPITAL_COMMUNITY)
Admission: RE | Admit: 2019-07-31 | Discharge: 2019-07-31 | Disposition: A | Discharge status: Home / Self Care | Payer: Medicare Other | Source: Ambulatory Visit | Attending: Orthopaedic Surgery | Admitting: Orthopaedic Surgery

## 2019-07-31 ENCOUNTER — Other Ambulatory Visit: Payer: Self-pay

## 2019-07-31 ENCOUNTER — Encounter (HOSPITAL_COMMUNITY): Payer: Self-pay

## 2019-07-31 ENCOUNTER — Ambulatory Visit (HOSPITAL_COMMUNITY)
Admission: RE | Admit: 2019-07-31 | Discharge: 2019-07-31 | Disposition: A | Discharge status: Home / Self Care | Payer: Medicare Other | Source: Ambulatory Visit | Attending: Orthopaedic Surgery | Admitting: Orthopaedic Surgery

## 2019-07-31 ENCOUNTER — Encounter (INDEPENDENT_AMBULATORY_CARE_PROVIDER_SITE_OTHER): Payer: Self-pay

## 2019-07-31 DIAGNOSIS — Z01818 Encounter for other preprocedural examination: Secondary | ICD-10-CM | POA: Diagnosis not present

## 2019-07-31 DIAGNOSIS — M1712 Unilateral primary osteoarthritis, left knee: Secondary | ICD-10-CM | POA: Insufficient documentation

## 2019-07-31 LAB — CBC WITH DIFFERENTIAL/PLATELET
Abs Immature Granulocytes: 0.03 10*3/uL (ref 0.00–0.07)
Basophils Absolute: 0.1 10*3/uL (ref 0.0–0.1)
Basophils Relative: 1 %
Eosinophils Absolute: 0.3 10*3/uL (ref 0.0–0.5)
Eosinophils Relative: 4 %
HCT: 46.8 % — ABNORMAL HIGH (ref 36.0–46.0)
Hemoglobin: 15.2 g/dL — ABNORMAL HIGH (ref 12.0–15.0)
Immature Granulocytes: 0 %
Lymphocytes Relative: 30 %
Lymphs Abs: 2.9 10*3/uL (ref 0.7–4.0)
MCH: 28.8 pg (ref 26.0–34.0)
MCHC: 32.5 g/dL (ref 30.0–36.0)
MCV: 88.8 fL (ref 80.0–100.0)
Monocytes Absolute: 0.5 10*3/uL (ref 0.1–1.0)
Monocytes Relative: 5 %
Neutro Abs: 5.9 10*3/uL (ref 1.7–7.7)
Neutrophils Relative %: 60 %
Platelets: 285 10*3/uL (ref 150–400)
RBC: 5.27 MIL/uL — ABNORMAL HIGH (ref 3.87–5.11)
RDW: 14.1 % (ref 11.5–15.5)
WBC: 9.7 10*3/uL (ref 4.0–10.5)
nRBC: 0 % (ref 0.0–0.2)

## 2019-07-31 LAB — BASIC METABOLIC PANEL
Anion gap: 11 (ref 5–15)
BUN: 18 mg/dL (ref 8–23)
CO2: 26 mmol/L (ref 22–32)
Calcium: 9.2 mg/dL (ref 8.9–10.3)
Chloride: 102 mmol/L (ref 98–111)
Creatinine, Ser: 0.74 mg/dL (ref 0.44–1.00)
GFR calc Af Amer: >60 mL/min (ref >60)
GFR calc non Af Amer: >60 mL/min (ref >60)
Glucose, Bld: 102 mg/dL — ABNORMAL HIGH (ref 70–99)
Potassium: 3.9 mmol/L (ref 3.5–5.1)
Sodium: 139 mmol/L (ref 135–145)

## 2019-07-31 LAB — URINALYSIS, ROUTINE W REFLEX MICROSCOPIC
Bacteria, UA: NONE SEEN
Bilirubin Urine: NEGATIVE
Glucose, UA: NEGATIVE mg/dL
Hgb urine dipstick: NEGATIVE
Ketones, ur: NEGATIVE mg/dL
Leukocytes,Ua: NEGATIVE
Nitrite: NEGATIVE
Protein, ur: NEGATIVE mg/dL
Specific Gravity, Urine: 1.013 (ref 1.005–1.030)
pH: 7 (ref 5.0–8.0)

## 2019-07-31 LAB — PROTIME-INR
INR: 1 (ref 0.8–1.2)
Prothrombin Time: 12.9 seconds (ref 11.4–15.2)

## 2019-07-31 LAB — SURGICAL PCR SCREEN
MRSA, PCR: NEGATIVE
Staphylococcus aureus: NEGATIVE

## 2019-07-31 LAB — ABO/RH: ABO/RH(D): A POS

## 2019-07-31 LAB — APTT: aPTT: 29 seconds (ref 24–36)

## 2019-07-31 NOTE — Progress Notes (Signed)
PCP -  Cardiologist -   Chest x-ray -  EKG - 10/31/18 Stress Test - no ECHO - no Cardiac Cath - no  Sleep Study - no CPAP -   Fasting Blood Sugar - NA Checks Blood Sugar _____ times a day  Blood Thinner Instructions:ASA  stop 7 days prior to surgery Aspirin Instructions: Last Dose:07/31/19  Anesthesia review:   Patient denies shortness of breath, fever, cough and chest pain at PAT appointment yes  Patient verbalized understanding of instructions that were given to them at the PAT appointment. Patient was also instructed that they will need to review over the PAT instructions again at home before surgery. yes

## 2019-08-01 ENCOUNTER — Other Ambulatory Visit: Payer: Self-pay | Admitting: Orthopaedic Surgery

## 2019-08-01 NOTE — Care Plan (Signed)
Spoke with patient prior to surgery. She plans to discharge to home with family and HHPT. Referral to [facility] at [facility]. She has all equipment at home. She will transition to OPPT after follow up with MD. Patient and MD in agreement with plan.  Choice offered   Stacy Duncan, [place]

## 2019-08-03 ENCOUNTER — Other Ambulatory Visit (HOSPITAL_COMMUNITY)
Admission: RE | Admit: 2019-08-03 | Discharge: 2019-08-03 | Disposition: A | Discharge status: Home / Self Care | Payer: Medicare Other | Source: Ambulatory Visit | Attending: Orthopaedic Surgery | Admitting: Orthopaedic Surgery

## 2019-08-03 DIAGNOSIS — Z01812 Encounter for preprocedural laboratory examination: Secondary | ICD-10-CM | POA: Diagnosis present

## 2019-08-03 DIAGNOSIS — Z20828 Contact with and (suspected) exposure to other viral communicable diseases: Secondary | ICD-10-CM | POA: Diagnosis not present

## 2019-08-06 LAB — NOVEL CORONAVIRUS, NAA (HOSP ORDER, SEND-OUT TO REF LAB; TAT 18-24 HRS): SARS-CoV-2, NAA: NOT DETECTED

## 2019-08-06 MED ORDER — TRANEXAMIC ACID 1000 MG/10ML IV SOLN
2000.0000 mg | INTRAVENOUS | Status: DC
  Filled 2019-08-06: qty 20

## 2019-08-06 MED ORDER — BUPIVACAINE LIPOSOME 1.3 % IJ SUSP
20.0000 mL | Freq: Once | INTRAMUSCULAR | Status: DC
  Filled 2019-08-06: qty 20

## 2019-08-06 NOTE — H&P (Signed)
TOTAL KNEE ADMISSION H&P  Patient is being admitted for left total knee arthroplasty.  Subjective:  Chief Complaint:left knee pain.  HPI: Stacy Duncan, 68 y.o. female, has a history of pain and functional disability in the left knee due to arthritis and has failed non-surgical conservative treatments for greater than 12 weeks to includeNSAID's and/or analgesics, corticosteriod injections, viscosupplementation injections, flexibility and strengthening excercises, use of assistive devices, weight reduction as appropriate and activity modification.  Onset of symptoms was gradual, starting 5 years ago with gradually worsening course since that time. The patient noted prior procedures on the knee to include  arthroscopy on the left knee(s).  Patient currently rates pain in the left knee(s) at 10 out of 10 with activity. Patient has night pain, worsening of pain with activity and weight bearing, pain that interferes with activities of daily living, crepitus and joint swelling.  Patient has evidence of subchondral cysts, subchondral sclerosis, periarticular osteophytes and joint space narrowing by imaging studies. There is no active infection.  Patient Active Problem List   Diagnosis Date Noted  . Primary localized osteoarthritis of right knee 11/21/2018  . Primary osteoarthritis of right knee 11/21/2018  . Personal history of colonic adenoma 08/08/2013  . Family history of colon cancer - elderly mother 08/08/2013   Past Medical History:  Diagnosis Date  . Cataract   . Depression   . Heart murmur   . Hypercholesteremia   . Hypertension     Past Surgical History:  Procedure Laterality Date  . ANKLE SURGERY Right   . BREAST LUMPECTOMY Left    mid 12s  . COLONOSCOPY  07/31/2013  . KNEE SURGERY Left   . POLYPECTOMY    . TOTAL KNEE ARTHROPLASTY Right 11/21/2018   Procedure: TOTAL KNEE ARTHROPLASTY;  Surgeon: Melrose Nakayama, MD;  Location: [facility];  Service: Orthopedics;  Laterality: Right;     Current Facility-Administered Medications  Medication Dose Route Frequency Provider Last Rate Last Dose  . [START ON 08/07/2019] bupivacaine liposome (EXPAREL) 1.3 % injection 266 mg  20 mL Other Once Melrose Nakayama, MD      . Derrill Memo ON 08/07/2019] tranexamic acid (CYKLOKAPRON) 2,000 mg in sodium chloride 0.9 % 50 mL Topical Application  123XX123 mg Topical To OR Melrose Nakayama, MD       Current Outpatient Medications  Medication Sig Dispense Refill Last Dose  . acetaminophen (TYLENOL) 500 MG tablet Take 500 mg by mouth every 6 (six) hours as needed for moderate pain.     Marland Kitchen aspirin EC 81 MG tablet Take 81 mg by mouth daily.     . chlorthalidone (HYGROTON) 25 MG tablet Take 25 mg by mouth daily.      Marland Kitchen escitalopram (LEXAPRO) 10 MG tablet Take 10 mg by mouth daily.      . meloxicam (MOBIC) 15 MG tablet Take 15 mg by mouth at bedtime.      . mometasone (NASONEX) 50 MCG/ACT nasal spray Place 1 spray into the nose daily as needed (allergies.).      Marland Kitchen Multiple Vitamin (MULTIVITAMIN WITH MINERALS) TABS tablet Take 1 tablet by mouth daily.     . rosuvastatin (CRESTOR) 20 MG tablet Take 20 mg by mouth daily.      . Vitamin D, Ergocalciferol, (DRISDOL) 1.25 MG (50000 UT) CAPS capsule Take 50,000 Units by mouth every Sunday.       No Known Allergies  Social History   Tobacco Use  . Smoking status: Current Every Day Smoker    Packs/day:  0.50    Types: Cigarettes    Last attempt to quit: 10/25/2006    Years since quitting: 12.7  . Smokeless tobacco: Never Used  Substance Use Topics  . Alcohol use: Yes    Alcohol/week: 2.0 standard drinks    Types: 2 Glasses of wine per week    Comment: per week    Family History  Problem Relation Age of Onset  . Colon cancer Mother 10  . Colon polyps Neg Hx   . Esophageal cancer Neg Hx   . Rectal cancer Neg Hx   . Stomach cancer Neg Hx      Review of Systems  Musculoskeletal: Positive for joint pain.       Left knee  All other systems reviewed and are  negative.   Objective:  Physical Exam  Constitutional: She is oriented to person, place, and time. She appears well-developed and well-nourished.  HENT:  Head: Normocephalic and atraumatic.  Eyes: Pupils are equal, round, and reactive to light.  Neck: Normal range of motion.  Cardiovascular: Normal rate and regular rhythm.  Respiratory: Effort normal.  GI: Soft.  Musculoskeletal:     Comments: Left knee does have moderate valgus deformity.  Her motion is about 0-110.  On the right she has a well-healed incision with normal alignment and motion from 0-120.  Sensation and motor function are intact in her feet with palpable pulses on both sides.  Neurological: She is alert and oriented to person, place, and time.  Skin: Skin is warm and dry.  Psychiatric: She has a normal mood and affect. Her behavior is normal. Judgment and thought content normal.    Vital signs in last 24 hours:    Labs:   Estimated body mass index is 33.78 kg/m as calculated from the following:   Height as of 07/31/19: 5\' 5"  (1.651 m).   Weight as of 07/31/19: 92.1 kg.   Imaging Review Plain radiographs demonstrate severe degenerative joint disease of the left knee(s). The overall alignment isneutral. The bone quality appears to be good for age and reported activity level.      Assessment/Plan:  End stage primary arthritis, left knee   The patient history, physical examination, clinical judgment of the provider and imaging studies are consistent with end stage degenerative joint disease of the left knee(s) and total knee arthroplasty is deemed medically necessary. The treatment options including medical management, injection therapy arthroscopy and arthroplasty were discussed at length. The risks and benefits of total knee arthroplasty were presented and reviewed. The risks due to aseptic loosening, infection, stiffness, patella tracking problems, thromboembolic complications and other imponderables were  discussed. The patient acknowledged the explanation, agreed to proceed with the plan and consent was signed. Patient is being admitted for inpatient treatment for surgery, pain control, PT, OT, prophylactic antibiotics, VTE prophylaxis, progressive ambulation and ADL's and discharge planning. The patient is planning to be discharged home with home health services  Patient's anticipated LOS is less than 2 midnights, meeting these requirements: - Younger than 24 - Lives within 1 hour of care - Has a competent adult at home to recover with post-op recover - NO history of  - Chronic pain requiring opiods  - Diabetes  - Coronary Artery Disease  - Heart failure  - Heart attack  - Stroke  - DVT/VTE  - Cardiac arrhythmia  - Respiratory Failure/COPD  - Renal failure  - Anemia  - Advanced Liver disease

## 2019-08-06 NOTE — Anesthesia Preprocedure Evaluation (Addendum)
Anesthesia Evaluation  Patient identified by MRN, date of birth, ID band Patient awake    Reviewed: Allergy & Precautions, Patient's Chart, lab work & pertinent test results  Airway Mallampati: II       Dental no notable dental hx. (+) Teeth Intact   Pulmonary neg pulmonary ROS, Current Smoker and Patient abstained from smoking., former smoker,    Pulmonary exam normal breath sounds clear to auscultation       Cardiovascular hypertension, Pt. on medications Normal cardiovascular exam Rhythm:Regular Rate:Normal     Neuro/Psych PSYCHIATRIC DISORDERS Depression negative neurological ROS     GI/Hepatic negative GI ROS, Neg liver ROS,   Endo/Other  negative endocrine ROS  Renal/GU negative Renal ROS  negative genitourinary   Musculoskeletal  (+) Arthritis , Osteoarthritis,    Abdominal (+) + obese,   Peds negative pediatric ROS (+)  Hematology negative hematology ROS (+)   Anesthesia Other Findings   Reproductive/Obstetrics negative OB ROS                            Anesthesia Physical  Anesthesia Plan  ASA: II  Anesthesia Plan: Spinal   Post-op Pain Management:  Regional for Post-op pain   Induction:   PONV Risk Score and Plan: 2 and Ondansetron, Midazolam and Dexamethasone  Airway Management Planned: Simple Face Mask, Nasal Cannula and Natural Airway  Additional Equipment: None  Intra-op Plan:   Post-operative Plan: Extubation in OR  Informed Consent: I have reviewed the patients History and Physical, chart, labs and discussed the procedure including the risks, benefits and alternatives for the proposed anesthesia with the patient or authorized representative who has indicated his/her understanding and acceptance.       Plan Discussed with: CRNA  Anesthesia Plan Comments:        Anesthesia Quick Evaluation

## 2019-08-07 ENCOUNTER — Ambulatory Visit (HOSPITAL_COMMUNITY): Payer: Medicare Other | Admitting: Certified Registered Nurse Anesthetist

## 2019-08-07 ENCOUNTER — Encounter (HOSPITAL_COMMUNITY)
Admission: RE | Disposition: A | Discharge status: Home / Self Care | Payer: Self-pay | Source: Home / Self Care | Attending: Orthopaedic Surgery

## 2019-08-07 ENCOUNTER — Encounter (HOSPITAL_COMMUNITY): Payer: Self-pay | Admitting: Emergency Medicine

## 2019-08-07 ENCOUNTER — Observation Stay (HOSPITAL_COMMUNITY)
Admission: RE | Admit: 2019-08-07 | Discharge: 2019-08-08 | Disposition: A | Discharge status: Home / Self Care | Payer: Medicare Other | Attending: Orthopaedic Surgery | Admitting: Orthopaedic Surgery

## 2019-08-07 ENCOUNTER — Other Ambulatory Visit: Payer: Self-pay

## 2019-08-07 ENCOUNTER — Ambulatory Visit (HOSPITAL_COMMUNITY): Payer: Medicare Other | Admitting: Physician Assistant

## 2019-08-07 DIAGNOSIS — Z7982 Long term (current) use of aspirin: Secondary | ICD-10-CM | POA: Diagnosis not present

## 2019-08-07 DIAGNOSIS — F329 Major depressive disorder, single episode, unspecified: Secondary | ICD-10-CM | POA: Insufficient documentation

## 2019-08-07 DIAGNOSIS — Z79899 Other long term (current) drug therapy: Secondary | ICD-10-CM | POA: Insufficient documentation

## 2019-08-07 DIAGNOSIS — M25562 Pain in left knee: Secondary | ICD-10-CM | POA: Diagnosis present

## 2019-08-07 DIAGNOSIS — M1712 Unilateral primary osteoarthritis, left knee: Secondary | ICD-10-CM | POA: Diagnosis not present

## 2019-08-07 DIAGNOSIS — E7800 Pure hypercholesterolemia, unspecified: Secondary | ICD-10-CM | POA: Insufficient documentation

## 2019-08-07 DIAGNOSIS — Z96651 Presence of right artificial knee joint: Secondary | ICD-10-CM | POA: Insufficient documentation

## 2019-08-07 DIAGNOSIS — F17210 Nicotine dependence, cigarettes, uncomplicated: Secondary | ICD-10-CM | POA: Insufficient documentation

## 2019-08-07 DIAGNOSIS — Z791 Long term (current) use of non-steroidal anti-inflammatories (NSAID): Secondary | ICD-10-CM | POA: Insufficient documentation

## 2019-08-07 DIAGNOSIS — I10 Essential (primary) hypertension: Secondary | ICD-10-CM | POA: Insufficient documentation

## 2019-08-07 HISTORY — PX: TOTAL KNEE ARTHROPLASTY: SHX125

## 2019-08-07 LAB — TYPE AND SCREEN
ABO/RH(D): A POS
Antibody Screen: NEGATIVE

## 2019-08-07 SURGERY — ARTHROPLASTY, KNEE, TOTAL
Anesthesia: Spinal | Site: Knee | Laterality: Left

## 2019-08-07 MED ORDER — STERILE WATER FOR IRRIGATION IR SOLN
Status: DC | PRN
  Administered 2019-08-07 (×2): 1000 mL

## 2019-08-07 MED ORDER — ACETAMINOPHEN 500 MG PO TABS
500.0000 mg | ORAL_TABLET | Freq: Four times a day (QID) | ORAL | Status: AC
  Administered 2019-08-07 (×2): 500 mg via ORAL
  Filled 2019-08-07 (×2): qty 1

## 2019-08-07 MED ORDER — ROSUVASTATIN CALCIUM 20 MG PO TABS
20.0000 mg | ORAL_TABLET | Freq: Every day | ORAL | Status: DC
  Administered 2019-08-07 – 2019-08-08 (×2): 20 mg via ORAL
  Filled 2019-08-07 (×2): qty 1

## 2019-08-07 MED ORDER — ONDANSETRON HCL 4 MG PO TABS: 4.0000 mg | ORAL_TABLET | Freq: Four times a day (QID) | ORAL | Status: DC | PRN

## 2019-08-07 MED ORDER — BUPIVACAINE-EPINEPHRINE 0.5% -1:200000 IJ SOLN
INTRAMUSCULAR | Status: AC
  Filled 2019-08-07: qty 1

## 2019-08-07 MED ORDER — CLONIDINE HCL (ANALGESIA) 100 MCG/ML EP SOLN
EPIDURAL | Status: DC | PRN
  Administered 2019-08-07: 100 ug

## 2019-08-07 MED ORDER — MIDAZOLAM HCL 5 MG/5ML IJ SOLN
INTRAMUSCULAR | Status: DC | PRN
  Administered 2019-08-07: 2 mg via INTRAVENOUS

## 2019-08-07 MED ORDER — METHOCARBAMOL 500 MG IVPB - SIMPLE MED
500.0000 mg | Freq: Four times a day (QID) | INTRAVENOUS | Status: DC | PRN
  Filled 2019-08-07: qty 50

## 2019-08-07 MED ORDER — MEPERIDINE HCL 50 MG/ML IJ SOLN: 6.2500 mg | INTRAMUSCULAR | Status: DC | PRN

## 2019-08-07 MED ORDER — ONDANSETRON HCL 4 MG/2ML IJ SOLN
INTRAMUSCULAR | Status: DC | PRN
  Administered 2019-08-07: 4 mg via INTRAVENOUS

## 2019-08-07 MED ORDER — BUPIVACAINE HCL (PF) 0.25 % IJ SOLN
INTRAMUSCULAR | Status: AC
  Filled 2019-08-07: qty 30

## 2019-08-07 MED ORDER — LACTATED RINGERS IV SOLN: INTRAVENOUS | Status: DC

## 2019-08-07 MED ORDER — CEFAZOLIN SODIUM-DEXTROSE 2-4 GM/100ML-% IV SOLN
2.0000 g | INTRAVENOUS | Status: AC
  Administered 2019-08-07: 08:00:00 2 g via INTRAVENOUS
  Filled 2019-08-07: qty 100

## 2019-08-07 MED ORDER — PROPOFOL 10 MG/ML IV BOLUS
INTRAVENOUS | Status: AC
  Filled 2019-08-07: qty 20

## 2019-08-07 MED ORDER — FENTANYL CITRATE (PF) 100 MCG/2ML IJ SOLN
INTRAMUSCULAR | Status: AC
  Filled 2019-08-07: qty 2

## 2019-08-07 MED ORDER — DEXAMETHASONE SODIUM PHOSPHATE 10 MG/ML IJ SOLN
INTRAMUSCULAR | Status: AC
  Filled 2019-08-07: qty 1

## 2019-08-07 MED ORDER — PROPOFOL 10 MG/ML IV BOLUS
INTRAVENOUS | Status: DC | PRN
  Administered 2019-08-07 (×2): 20 mg via INTRAVENOUS

## 2019-08-07 MED ORDER — CEFAZOLIN SODIUM-DEXTROSE 2-4 GM/100ML-% IV SOLN
2.0000 g | Freq: Four times a day (QID) | INTRAVENOUS | Status: AC
  Administered 2019-08-07 (×2): 2 g via INTRAVENOUS
  Filled 2019-08-07 (×3): qty 100

## 2019-08-07 MED ORDER — HYDROCODONE-ACETAMINOPHEN 5-325 MG PO TABS: 1.0000 | ORAL_TABLET | ORAL | Status: DC | PRN

## 2019-08-07 MED ORDER — SODIUM CHLORIDE (PF) 0.9 % IJ SOLN
INTRAMUSCULAR | Status: AC
  Filled 2019-08-07: qty 50

## 2019-08-07 MED ORDER — EPHEDRINE SULFATE-NACL 50-0.9 MG/10ML-% IV SOSY
PREFILLED_SYRINGE | INTRAVENOUS | Status: DC | PRN
  Administered 2019-08-07 (×3): 5 mg via INTRAVENOUS

## 2019-08-07 MED ORDER — LACTATED RINGERS IV SOLN
INTRAVENOUS | Status: DC
  Administered 2019-08-07 (×2): via INTRAVENOUS

## 2019-08-07 MED ORDER — MORPHINE SULFATE (PF) 2 MG/ML IV SOLN
0.5000 mg | INTRAVENOUS | Status: DC | PRN
  Administered 2019-08-07: 1 mg via INTRAVENOUS
  Filled 2019-08-07: qty 1

## 2019-08-07 MED ORDER — KETOROLAC TROMETHAMINE 15 MG/ML IJ SOLN
7.5000 mg | Freq: Four times a day (QID) | INTRAMUSCULAR | Status: AC
  Administered 2019-08-07 – 2019-08-08 (×3): 7.5 mg via INTRAVENOUS
  Filled 2019-08-07 (×3): qty 1

## 2019-08-07 MED ORDER — ASPIRIN 81 MG PO CHEW
81.0000 mg | CHEWABLE_TABLET | Freq: Two times a day (BID) | ORAL | Status: DC
  Administered 2019-08-08: 81 mg via ORAL
  Filled 2019-08-07: qty 1

## 2019-08-07 MED ORDER — GLYCOPYRROLATE PF 0.2 MG/ML IJ SOSY
PREFILLED_SYRINGE | INTRAMUSCULAR | Status: AC
  Filled 2019-08-07: qty 1

## 2019-08-07 MED ORDER — BISACODYL 5 MG PO TBEC: 5.0000 mg | DELAYED_RELEASE_TABLET | Freq: Every day | ORAL | Status: DC | PRN

## 2019-08-07 MED ORDER — MIDAZOLAM HCL 2 MG/2ML IJ SOLN
INTRAMUSCULAR | Status: AC
  Filled 2019-08-07: qty 2

## 2019-08-07 MED ORDER — BUPIVACAINE-EPINEPHRINE (PF) 0.5% -1:200000 IJ SOLN
INTRAMUSCULAR | Status: DC | PRN
  Administered 2019-08-07: 30 mL

## 2019-08-07 MED ORDER — TRANEXAMIC ACID-NACL 1000-0.7 MG/100ML-% IV SOLN
1000.0000 mg | INTRAVENOUS | Status: DC
  Filled 2019-08-07: qty 100

## 2019-08-07 MED ORDER — SODIUM CHLORIDE 0.9 % IR SOLN
Status: DC | PRN
  Administered 2019-08-07: 3000 mL

## 2019-08-07 MED ORDER — CHLORTHALIDONE 25 MG PO TABS
25.0000 mg | ORAL_TABLET | Freq: Every day | ORAL | Status: DC
  Administered 2019-08-07 – 2019-08-08 (×2): 25 mg via ORAL
  Filled 2019-08-07 (×2): qty 1

## 2019-08-07 MED ORDER — LACTATED RINGERS IV SOLN
INTRAVENOUS | Status: DC
  Administered 2019-08-07 (×2): via INTRAVENOUS

## 2019-08-07 MED ORDER — ONDANSETRON HCL 4 MG/2ML IJ SOLN
INTRAMUSCULAR | Status: AC
  Filled 2019-08-07: qty 2

## 2019-08-07 MED ORDER — PROPOFOL 500 MG/50ML IV EMUL
INTRAVENOUS | Status: DC | PRN
  Administered 2019-08-07: 75 ug/kg/min via INTRAVENOUS

## 2019-08-07 MED ORDER — MENTHOL 3 MG MT LOZG: 1.0000 | LOZENGE | OROMUCOSAL | Status: DC | PRN

## 2019-08-07 MED ORDER — ALUM & MAG HYDROXIDE-SIMETH 200-200-20 MG/5ML PO SUSP: 30.0000 mL | ORAL | Status: DC | PRN

## 2019-08-07 MED ORDER — KETOROLAC TROMETHAMINE 15 MG/ML IJ SOLN
INTRAMUSCULAR | Status: AC
  Filled 2019-08-07: qty 1

## 2019-08-07 MED ORDER — PROMETHAZINE HCL 25 MG/ML IJ SOLN: 6.2500 mg | INTRAMUSCULAR | Status: DC | PRN

## 2019-08-07 MED ORDER — PHENOL 1.4 % MT LIQD
1.0000 | OROMUCOSAL | Status: DC | PRN
  Filled 2019-08-07: qty 177

## 2019-08-07 MED ORDER — DIPHENHYDRAMINE HCL 12.5 MG/5ML PO ELIX: 12.5000 mg | ORAL_SOLUTION | ORAL | Status: DC | PRN

## 2019-08-07 MED ORDER — DEXAMETHASONE SODIUM PHOSPHATE 10 MG/ML IJ SOLN
INTRAMUSCULAR | Status: DC | PRN
  Administered 2019-08-07: 5 mg via INTRAVENOUS

## 2019-08-07 MED ORDER — 0.9 % SODIUM CHLORIDE (POUR BTL) OPTIME
TOPICAL | Status: DC | PRN
  Administered 2019-08-07: 1000 mL

## 2019-08-07 MED ORDER — POVIDONE-IODINE 10 % EX SWAB
2.0000 "application " | Freq: Once | CUTANEOUS | Status: AC
  Administered 2019-08-07: 2 via TOPICAL

## 2019-08-07 MED ORDER — CHLORHEXIDINE GLUCONATE 4 % EX LIQD: 60.0000 mL | Freq: Once | CUTANEOUS | Status: DC

## 2019-08-07 MED ORDER — BUPIVACAINE HCL (PF) 0.75 % IJ SOLN
INTRAMUSCULAR | Status: DC | PRN
  Administered 2019-08-07: 1.6 mL via INTRATHECAL

## 2019-08-07 MED ORDER — ROPIVACAINE HCL 7.5 MG/ML IJ SOLN
INTRAMUSCULAR | Status: DC | PRN
  Administered 2019-08-07 (×4): 5 mL via PERINEURAL

## 2019-08-07 MED ORDER — EPHEDRINE 5 MG/ML INJ
INTRAVENOUS | Status: AC
  Filled 2019-08-07: qty 10

## 2019-08-07 MED ORDER — METOCLOPRAMIDE HCL 5 MG PO TABS: 5.0000 mg | ORAL_TABLET | Freq: Three times a day (TID) | ORAL | Status: DC | PRN

## 2019-08-07 MED ORDER — ACETAMINOPHEN 325 MG PO TABS: 325.0000 mg | ORAL_TABLET | Freq: Four times a day (QID) | ORAL | Status: DC | PRN

## 2019-08-07 MED ORDER — GLYCOPYRROLATE 0.2 MG/ML IJ SOLN
INTRAMUSCULAR | Status: DC | PRN
  Administered 2019-08-07: 0.2 mg via INTRAVENOUS

## 2019-08-07 MED ORDER — TRANEXAMIC ACID-NACL 1000-0.7 MG/100ML-% IV SOLN
1000.0000 mg | Freq: Once | INTRAVENOUS | Status: AC
  Administered 2019-08-07: 1000 mg via INTRAVENOUS
  Filled 2019-08-07: qty 100

## 2019-08-07 MED ORDER — BUPIVACAINE LIPOSOME 1.3 % IJ SUSP
INTRAMUSCULAR | Status: DC | PRN
  Administered 2019-08-07: 20 mL

## 2019-08-07 MED ORDER — PROPOFOL 500 MG/50ML IV EMUL
INTRAVENOUS | Status: AC
  Filled 2019-08-07: qty 50

## 2019-08-07 MED ORDER — ESCITALOPRAM OXALATE 10 MG PO TABS
10.0000 mg | ORAL_TABLET | Freq: Every day | ORAL | Status: DC
  Administered 2019-08-08: 10 mg via ORAL
  Filled 2019-08-07: qty 1

## 2019-08-07 MED ORDER — HYDROCODONE-ACETAMINOPHEN 7.5-325 MG PO TABS
1.0000 | ORAL_TABLET | ORAL | Status: DC | PRN
  Administered 2019-08-07 (×2): 1 via ORAL
  Administered 2019-08-07 – 2019-08-08 (×4): 2 via ORAL
  Filled 2019-08-07 (×4): qty 2
  Filled 2019-08-07 (×2): qty 1

## 2019-08-07 MED ORDER — DOCUSATE SODIUM 100 MG PO CAPS
100.0000 mg | ORAL_CAPSULE | Freq: Two times a day (BID) | ORAL | Status: DC
  Administered 2019-08-07 – 2019-08-08 (×3): 100 mg via ORAL
  Filled 2019-08-07 (×3): qty 1

## 2019-08-07 MED ORDER — METHOCARBAMOL 500 MG PO TABS
500.0000 mg | ORAL_TABLET | Freq: Four times a day (QID) | ORAL | Status: DC | PRN
  Administered 2019-08-07 – 2019-08-08 (×3): 500 mg via ORAL
  Filled 2019-08-07 (×3): qty 1

## 2019-08-07 MED ORDER — SODIUM CHLORIDE 0.9 % IJ SOLN
INTRAMUSCULAR | Status: DC | PRN
  Administered 2019-08-07: 30 mL

## 2019-08-07 MED ORDER — KETOROLAC TROMETHAMINE 15 MG/ML IJ SOLN
15.0000 mg | Freq: Once | INTRAMUSCULAR | Status: AC
  Administered 2019-08-07: 15 mg via INTRAVENOUS

## 2019-08-07 MED ORDER — ONDANSETRON HCL 4 MG/2ML IJ SOLN: 4.0000 mg | Freq: Four times a day (QID) | INTRAMUSCULAR | Status: DC | PRN

## 2019-08-07 MED ORDER — ROPIVACAINE HCL 5 MG/ML IJ SOLN
INTRAMUSCULAR | Status: DC | PRN
  Administered 2019-08-07 (×2): 5 mL via PERINEURAL

## 2019-08-07 MED ORDER — TRANEXAMIC ACID 1000 MG/10ML IV SOLN
INTRAVENOUS | Status: DC | PRN
  Administered 2019-08-07: 2000 mg via TOPICAL

## 2019-08-07 MED ORDER — FLUTICASONE PROPIONATE 50 MCG/ACT NA SUSP
1.0000 | Freq: Every day | NASAL | Status: DC
  Administered 2019-08-08: 1 via NASAL
  Filled 2019-08-07: qty 16

## 2019-08-07 MED ORDER — HYDROMORPHONE HCL 1 MG/ML IJ SOLN: 0.2500 mg | INTRAMUSCULAR | Status: DC | PRN

## 2019-08-07 MED ORDER — FENTANYL CITRATE (PF) 100 MCG/2ML IJ SOLN
INTRAMUSCULAR | Status: DC | PRN
  Administered 2019-08-07: 100 ug via INTRAVENOUS

## 2019-08-07 MED ORDER — METOCLOPRAMIDE HCL 5 MG/ML IJ SOLN: 5.0000 mg | Freq: Three times a day (TID) | INTRAMUSCULAR | Status: DC | PRN

## 2019-08-07 SURGICAL SUPPLY — 54 items
ATTUNE MED DOME PAT 38 KNEE (Knees) ×2 IMPLANT
ATTUNE MED DOME PAT 38MM KNEE (Knees) ×1 IMPLANT
ATTUNE PS FEM LT SZ 5 CEM KNEE (Femur) ×3 IMPLANT
ATTUNE PSRP INSR SZ5 5 KNEE (Insert) ×2 IMPLANT
ATTUNE PSRP INSR SZ5 5MM KNEE (Insert) ×1 IMPLANT
BAG DECANTER FOR FLEXI CONT (MISCELLANEOUS) ×3 IMPLANT
BAG ZIPLOCK 12X15 (MISCELLANEOUS) ×3 IMPLANT
BASE TIBIAL ROT PLAT SZ 5 KNEE (Knees) ×1 IMPLANT
BLADE SAGITTAL 25.0X1.19X90 (BLADE) ×2 IMPLANT
BLADE SAGITTAL 25.0X1.19X90MM (BLADE) ×1
BLADE SAW SGTL 11.0X1.19X90.0M (BLADE) ×3 IMPLANT
BLADE SAW SGTL 18X1.27X75 (BLADE) ×2 IMPLANT
BLADE SAW SGTL 18X1.27X75MM (BLADE) ×1
BNDG ELASTIC 6X5.8 VLCR STR LF (GAUZE/BANDAGES/DRESSINGS) ×3 IMPLANT
BOOTIES KNEE HIGH SLOAN (MISCELLANEOUS) ×3 IMPLANT
BOWL SMART MIX CTS (DISPOSABLE) ×3 IMPLANT
CEMENT HV SMART SET (Cement) ×6 IMPLANT
CHLORAPREP W/TINT 26 (MISCELLANEOUS) ×6 IMPLANT
COVER SURGICAL LIGHT HANDLE (MISCELLANEOUS) ×3 IMPLANT
COVER WAND RF STERILE (DRAPES) IMPLANT
CUFF TOURN SGL QUICK 34 (TOURNIQUET CUFF) ×2
CUFF TRNQT CYL 34X4.125X (TOURNIQUET CUFF) ×1 IMPLANT
DECANTER SPIKE VIAL GLASS SM (MISCELLANEOUS) ×6 IMPLANT
DRAPE SHEET LG 3/4 BI-LAMINATE (DRAPES) ×3 IMPLANT
DRAPE TOP 10253 STERILE (DRAPES) ×3 IMPLANT
DRAPE U-SHAPE 47X51 STRL (DRAPES) ×3 IMPLANT
DRSG AQUACEL AG ADV 3.5X10 (GAUZE/BANDAGES/DRESSINGS) ×3 IMPLANT
ELECT REM PT RETURN 15FT ADLT (MISCELLANEOUS) ×3 IMPLANT
GLOVE BIO SURGEON STRL SZ8 (GLOVE) ×6 IMPLANT
GLOVE BIOGEL PI IND STRL 8 (GLOVE) ×2 IMPLANT
GLOVE BIOGEL PI INDICATOR 8 (GLOVE) ×4
GOWN STRL REUS W/TWL XL LVL3 (GOWN DISPOSABLE) ×6 IMPLANT
HANDPIECE INTERPULSE COAX TIP (DISPOSABLE) ×2
HOLDER FOLEY CATH W/STRAP (MISCELLANEOUS) IMPLANT
HOOD PEEL AWAY FLYTE STAYCOOL (MISCELLANEOUS) ×9 IMPLANT
KIT TURNOVER KIT A (KITS) IMPLANT
MANIFOLD NEPTUNE II (INSTRUMENTS) ×3 IMPLANT
NS IRRIG 1000ML POUR BTL (IV SOLUTION) ×3 IMPLANT
PACK TOTAL KNEE CUSTOM (KITS) ×3 IMPLANT
PAD ARMBOARD 7.5X6 YLW CONV (MISCELLANEOUS) ×3 IMPLANT
PIN DRILL FIX HALF THREAD (BIT) ×3 IMPLANT
PIN STEINMAN FIXATION KNEE (PIN) ×3 IMPLANT
PROTECTOR NERVE ULNAR (MISCELLANEOUS) ×3 IMPLANT
SET HNDPC FAN SPRY TIP SCT (DISPOSABLE) ×1 IMPLANT
SUT ETHIBOND NAB CT1 #1 30IN (SUTURE) ×6 IMPLANT
SUT VIC AB 0 CT1 36 (SUTURE) ×3 IMPLANT
SUT VIC AB 2-0 CT1 27 (SUTURE) ×2
SUT VIC AB 2-0 CT1 TAPERPNT 27 (SUTURE) ×1 IMPLANT
SUT VIC AB 3-0 CT1 27 (SUTURE) ×2
SUT VIC AB 3-0 CT1 TAPERPNT 27 (SUTURE) ×1 IMPLANT
TIBIAL BASE ROT PLAT SZ 5 KNEE (Knees) ×3 IMPLANT
TRAY FOLEY MTR SLVR 16FR STAT (SET/KITS/TRAYS/PACK) IMPLANT
WATER STERILE IRR 1000ML POUR (IV SOLUTION) ×3 IMPLANT
WRAP KNEE MAXI GEL POST OP (GAUZE/BANDAGES/DRESSINGS) ×3 IMPLANT

## 2019-08-07 NOTE — Transfer of Care (Signed)
Immediate Anesthesia Transfer of Care Note  Patient: Stacy Duncan  Procedure(s) Performed: LEFT TOTAL KNEE ARTHROPLASTY (Left Knee)  Patient Location: PACU  Anesthesia Type:Spinal  Level of Consciousness: awake, alert , oriented and patient cooperative  Airway & Oxygen Therapy: Patient Spontanous Breathing and Patient connected to face mask  Post-op Assessment: Report given to RN and Post -op Vital signs reviewed and stable  Post vital signs: Reviewed and stable  Last Vitals:  Vitals Value Taken Time  BP    Temp    Pulse    Resp    SpO2      Last Pain:  Vitals:   08/07/19 0629  TempSrc: Oral  PainSc:       Patients Stated Pain Goal: 4 ([id number])  Complications: No apparent anesthesia complications

## 2019-08-07 NOTE — Anesthesia Procedure Notes (Signed)
Spinal  Patient location during procedure: OR Start time: 08/07/2019 7:29 AM End time: 08/07/2019 7:32 AM Staffing Resident/CRNA: Claudia Desanctis, CRNA Performed: resident/CRNA  Preanesthetic Checklist Completed: patient identified, site marked, surgical consent, pre-op evaluation, timeout performed, IV checked, risks and benefits discussed and monitors and equipment checked Spinal Block Patient position: sitting Prep: DuraPrep Patient monitoring: heart rate, cardiac monitor, continuous pulse ox and blood pressure Approach: midline Location: L3-4 Injection technique: single-shot Needle Needle type: Sprotte and Pencan  Needle gauge: 24 G Needle length: 10 cm Needle insertion depth: 8 cm Assessment Sensory level: T4

## 2019-08-07 NOTE — Evaluation (Signed)
Physical Therapy Evaluation Patient Details Name: Stacy Duncan MRN: [id number] DOB: 04/25/1951 Today's Date: 08/07/2019   History of Present Illness  L TKA; PMH R TKA January 2020  Clinical Impression  Pt is s/p TKA resulting in the deficits listed below (see PT Problem List). Pt ambulated 54' with RW, no loss of balance. Initiated TKA HEP. Good progress expected.  Pt will benefit from skilled PT to increase their independence and safety with mobility to allow discharge to the venue listed below.      Follow Up Recommendations Follow surgeon's recommendation for DC plan and follow-up therapies (HHPT per pt)    Equipment Recommendations  None recommended by PT    Recommendations for Other Services       Precautions / Restrictions Precautions Precautions: Knee Precaution Comments: reviewed no pillow under knee; pt denies h/o falls in past year Restrictions Weight Bearing Restrictions: No Other Position/Activity Restrictions: WBAT      Mobility  Bed Mobility Overal bed mobility: Modified Independent             General bed mobility comments: HOB up  Transfers Overall transfer level: Needs assistance Equipment used: Rolling walker (2 wheeled) Transfers: Sit to/from Stand Sit to Stand: Min guard;From elevated surface         General transfer comment: VCs hand placement, min/guard safety  Ambulation/Gait Ambulation/Gait assistance: Min guard Gait Distance (Feet): 80 Feet Assistive device: Rolling walker (2 wheeled) Gait Pattern/deviations: Step-through pattern;Decreased stride length;Antalgic Gait velocity: decr   General Gait Details: steady, no loss of balance, good step through sequencing  Stairs            Wheelchair Mobility    Modified Rankin (Stroke Patients Only)       Balance Overall balance assessment: Modified Independent                                           Pertinent Vitals/Pain Pain Assessment: 0-10 Pain  Score: 7  Pain Location: L knee walking Pain Descriptors / Indicators: Sore Pain Intervention(s): Limited activity within patient's tolerance;Monitored during session;Premedicated before session;Ice applied    Home Living Family/patient expects to be discharged to:: Private residence Living Arrangements: Non-relatives/Friends Available Help at Discharge: Available 24 hours/day   Home Access: Stairs to enter Entrance Stairs-Rails: None Entrance Stairs-Number of Steps: 3   Home Equipment: Environmental consultant - 2 wheels;Cane - single point      Prior Function Level of Independence: Independent               Hand Dominance        Extremity/Trunk Assessment   Upper Extremity Assessment Upper Extremity Assessment: Overall WFL for tasks assessed    Lower Extremity Assessment Lower Extremity Assessment: LLE deficits/detail LLE Deficits / Details: SLR 3/5, knee ext at least 3/5, AAROM knee 0-70* LLE Sensation: WNL LLE Coordination: WNL    Cervical / Trunk Assessment Cervical / Trunk Assessment: Normal  Communication   Communication: No difficulties  Cognition Arousal/Alertness: Awake/alert Behavior During Therapy: WFL for tasks assessed/performed Overall Cognitive Status: Within Functional Limits for tasks assessed                                        General Comments      Exercises Total Joint Exercises Ankle Circles/Pumps: AROM;Both;10 reps  Quad Sets: AROM;Left;5 reps;Supine Heel Slides: AAROM;Left;5 reps;Supine Goniometric ROM: 0-70* AAROM L knee   Assessment/Plan    PT Assessment Patient needs continued PT services  PT Problem List Decreased strength;Decreased range of motion;Decreased activity tolerance;Pain;Decreased mobility       PT Treatment Interventions DME instruction;Gait training;Stair training;Functional mobility training;Therapeutic exercise;Patient/family education    PT Goals (Current goals can be found in the Care Plan section)   Acute Rehab PT Goals Patient Stated Goal: return to walking without AD PT Goal Formulation: With patient/family Time For Goal Achievement: 08/14/19 Potential to Achieve Goals: Good    Frequency 7X/week   Barriers to discharge        Co-evaluation               AM-PAC PT "6 Clicks" Mobility  Outcome Measure Help needed turning from your back to your side while in a flat bed without using bedrails?: A Little Help needed moving from lying on your back to sitting on the side of a flat bed without using bedrails?: A Little Help needed moving to and from a bed to a chair (including a wheelchair)?: A Little Help needed standing up from a chair using your arms (e.g., wheelchair or bedside chair)?: A Little Help needed to walk in hospital room?: A Little Help needed climbing 3-5 steps with a railing? : A Lot 6 Click Score: 17    End of Session Equipment Utilized During Treatment: Gait belt Activity Tolerance: Patient tolerated treatment well Patient left: in chair;with call bell/phone within reach;with family/visitor present Nurse Communication: Mobility status PT Visit Diagnosis: Difficulty in walking, not elsewhere classified (R26.2);Pain Pain - Right/Left: Left Pain - part of body: Knee    Time: 1349-1418 PT Time Calculation (min) (ACUTE ONLY): 29 min   Charges:   PT Evaluation $PT Eval Low Complexity: 1 Low PT Treatments $Gait Training: 8-22 mins       Blondell Reveal Kistler PT 08/07/2019  Acute Rehabilitation Services Pager [phone] Office [phone]

## 2019-08-07 NOTE — Anesthesia Postprocedure Evaluation (Signed)
Anesthesia Post Note  Patient: Stacy Duncan  Procedure(s) Performed: LEFT TOTAL KNEE ARTHROPLASTY (Left Knee)     Patient location during evaluation: PACU Anesthesia Type: Spinal Level of consciousness: awake Pain management: pain level controlled Vital Signs Assessment: post-procedure vital signs reviewed and stable Respiratory status: spontaneous breathing Cardiovascular status: stable Postop Assessment: patient able to bend at knees, no backache, no headache and spinal receding Anesthetic complications: no    Last Vitals:  Vitals:   08/07/19 0945 08/07/19 1000  BP: 125/64 (!) 129/59  Pulse: 64 63  Resp: 18 13  Temp:    SpO2: 100% 98%    Last Pain:  Vitals:   08/07/19 1000  TempSrc:   PainSc: 0-No pain   Pain Goal: Patients Stated Pain Goal: 4 (08/07/19 0601)  LLE Motor Response: Purposeful movement (08/07/19 1000) LLE Sensation: Numbness (08/07/19 1000) RLE Motor Response: Purposeful movement (08/07/19 1000) RLE Sensation: Numbness (08/07/19 1000) L Sensory Level: L5-Outer lower leg, top of foot, great toe (08/07/19 1000) R Sensory Level: L5-Outer lower leg, top of foot, great toe (08/07/19 1000) Epidural/Spinal Function Patient able to flex knees: Yes (08/07/19 1000)  Huston Foley

## 2019-08-07 NOTE — Op Note (Signed)
PREOP DIAGNOSIS: DJD LEFT KNEE POSTOP DIAGNOSIS:  same PROCEDURE: LEFT TKR ANESTHESIA: Spinal and MAC ATTENDING SURGEON: Hessie Dibble ASSISTANT: Loni Dolly PA  INDICATIONS FOR PROCEDURE: Stacy Duncan is a 68 y.o. female who has struggled for a long time with pain due to degenerative arthritis of the left knee.  The patient has failed many conservative non-operative measures and at this point has pain which limits the ability to sleep and walk.  The patient is offered total knee replacement.  Informed operative consent was obtained after discussion of possible risks of anesthesia, infection, neurovascular injury, DVT, and death.  The importance of the post-operative rehabilitation protocol to optimize result was stressed extensively with the patient.  SUMMARY OF FINDINGS AND PROCEDURE:  Stacy Duncan was taken to the operative suite where under the above anesthesia a left knee replacement was performed.  There were advanced degenerative changes and the bone quality was excellent.  We used the DePuyAttune system and placed size 5 femur, 5 tibia, 38 mm all polyethylene patella, and a size 5 mm spacer.  Loni Dolly PA-C assisted throughout and was invaluable to the completion of the case in that he helped retract and maintain exposure while I placed the components.  He also helped close thereby minimizing OR time.  The patient was admitted for appropriate post-op care to include perioperative antibiotics and mechanical and pharmacologic measures for DVT prophylaxis.  DESCRIPTION OF PROCEDURE:  Stacy Duncan was taken to the operative suite where the above anesthesia was applied.  The patient was positioned supine and prepped and draped in normal sterile fashion.  An appropriate time out was performed.  After the administration of kefzol pre-op antibiotic the leg was elevated and exsanguinated and a tourniquet inflated.  A standard longitudinal incision was made on the anterior knee.  Dissection was carried  down to the extensor mechanism.  All appropriate anti-infective measures were used including the pre-operative antibiotic, betadine impregnated drape, and closed hooded exhaust systems for each member of the surgical team.  A medial parapatellar incision was made in the extensor mechanism and the knee cap flipped and the knee flexed.  Some residual meniscal tissues were removed along with any remaining ACL/PCL tissue.  A guide was placed on the tibia and a flat cut was made on it's superior surface.  An intramedullary guide was placed in the femur and was utilized to make anterior and posterior cuts creating an appropriate flexion gap.  A second intramedullary guide was placed in the femur to make a distal cut properly balancing the knee with an extension gap equal to the flexion gap.  The three bones sized to the above mentioned sizes and the appropriate guides were placed and utilized.  A trial reduction was done and the knee easily came to full extension and the patella tracked well on flexion.  The trial components were removed and all bones were cleaned with pulsatile lavage and then dried thoroughly.  Cement was mixed and was pressurized onto the bones followed by placement of the aforementioned components.  Excess cement was trimmed and pressure was held on the components until the cement had hardened.  The tourniquet was deflated and a small amount of bleeding was controlled with cautery and pressure.  The knee was irrigated thoroughly.  The extensor mechanism was re-approximated with #1 ethibond in interrupted fashion.  The knee was flexed and the repair was solid.  The subcutaneous tissues were re-approximated with #0 and #2-0 vicryl and the skin closed with a  subcuticular stitch and steristrips.  A sterile dressing was applied.  Intraoperative fluids, EBL, and tourniquet time can be obtained from anesthesia records.  DISPOSITION:  The patient was taken to recovery room in stable condition and admitted  for appropriate post-op care to include peri-operative antibiotic and DVT prophylaxis with mechanical and pharmacologic measures.  Stacy Duncan 08/07/2019, 9:07 AM

## 2019-08-07 NOTE — Interval H&P Note (Signed)
History and Physical Interval Note:  08/07/2019 7:25 AM  Stacy Duncan  has presented today for surgery, with the diagnosis of LEFT KNEE DEGENERATIVE JOINT DISEASE.  The various methods of treatment have been discussed with the patient and family. After consideration of risks, benefits and other options for treatment, the patient has consented to  Procedure(s): LEFT TOTAL KNEE ARTHROPLASTY (Left) as a surgical intervention.  The patient's history has been reviewed, patient examined, no change in status, stable for surgery.  I have reviewed the patient's chart and labs.  Questions were answered to the patient's satisfaction.     Hessie Dibble

## 2019-08-07 NOTE — Anesthesia Procedure Notes (Signed)
Anesthesia Regional Block: Adductor canal block   Pre-Anesthetic Checklist: ,, timeout performed, Correct Patient, Correct Site, Correct Laterality, Correct Procedure, Correct Position, site marked, Risks and benefits discussed,  Surgical consent,  Pre-op evaluation,  At surgeon's request and post-op pain management  Laterality: Lower and Left  Prep: chloraprep       Needles:  Injection technique: Single-shot  Needle Type: Echogenic Stimulator Needle     Needle Length: 10cm  Needle Gauge: 21   Needle insertion depth: 3 cm   Additional Needles:   Procedures:,,,, ultrasound used (permanent image in chart),,,,  Narrative:  Start time: 08/07/2019 7:00 AM End time: 08/07/2019 7:08 AM Injection made incrementally with aspirations every 5 mL.  Performed by: Personally  Anesthesiologist: Lyn Hollingshead, MD

## 2019-08-07 NOTE — Care Plan (Signed)
Ortho Bundle Case Management Note  Patient Details  Name: Stacy Duncan MRN: [id number] Date of Birth: 1951/07/11  Spoke with patient prior to surgery. She plans to discharge to home with family and HHPT. Referral to [facility]. She has all equipment at home. She will transition to OPPT after follow up with MD. Patient and MD in agreement with plan.  Choice offered                   DME Arranged:    DME Agency:     HH Arranged:  PT HH Agency:  [facility] (formerly [facility])  Additional Comments: Please contact me with any questions of if this plan should need to change.  Ladell Heads,  [facility]  [phone] 08/07/2019, 8:51 AM

## 2019-08-08 ENCOUNTER — Encounter (HOSPITAL_COMMUNITY): Payer: Self-pay | Admitting: Orthopaedic Surgery

## 2019-08-08 DIAGNOSIS — M1712 Unilateral primary osteoarthritis, left knee: Secondary | ICD-10-CM | POA: Diagnosis not present

## 2019-08-08 MED ORDER — ASPIRIN EC 81 MG PO TBEC: 81.0000 mg | DELAYED_RELEASE_TABLET | Freq: Two times a day (BID) | ORAL | 0 refills | Status: AC

## 2019-08-08 MED ORDER — TIZANIDINE HCL 4 MG PO TABS: 4.0000 mg | ORAL_TABLET | Freq: Four times a day (QID) | ORAL | 1 refills | Status: DC | PRN

## 2019-08-08 MED ORDER — HYDROCODONE-ACETAMINOPHEN 5-325 MG PO TABS: 1.0000 | ORAL_TABLET | Freq: Four times a day (QID) | ORAL | 0 refills | Status: DC | PRN

## 2019-08-08 NOTE — Progress Notes (Signed)
Physical Therapy Treatment Patient Details Name: Stacy Duncan MRN: [id number] DOB: 1951-04-10 Today's Date: 08/08/2019    History of Present Illness L TKA; PMH R TKA January 2020    PT Comments    Pt ambulated 200' x 2 with RW, completed stair training, she demonstrates good understanding of TKA HEP. She is ready to DC home from PT standpoint.   Follow Up Recommendations  Follow surgeon's recommendation for DC plan and follow-up therapies     Equipment Recommendations  None recommended by PT    Recommendations for Other Services       Precautions / Restrictions Precautions Precautions: Knee Precaution Comments: reviewed no pillow under knee; pt denies h/o falls in past year Restrictions Weight Bearing Restrictions: No Other Position/Activity Restrictions: WBAT    Mobility  Bed Mobility Overal bed mobility: Modified Independent             General bed mobility comments: HOB up  Transfers Overall transfer level: Needs assistance Equipment used: Rolling walker (2 wheeled) Transfers: Sit to/from Stand Sit to Stand: Supervision         General transfer comment: VCs hand placement  Ambulation/Gait Ambulation/Gait assistance: Supervision Gait Distance (Feet): 400 Feet(200' x 2) Assistive device: Rolling walker (2 wheeled) Gait Pattern/deviations: Step-through pattern;Decreased stride length;Antalgic Gait velocity: decr   General Gait Details: steady, no loss of balance, good step through sequencing   Stairs Stairs: Yes   Stair Management: One rail Left;Forwards;With cane;Backwards;Step to pattern;With walker Number of Stairs: 6 General stair comments: 3 steps with RW backwards; then 3 steps with 1 rail L and SPC on R forwards, VCs sequencing   Wheelchair Mobility    Modified Rankin (Stroke Patients Only)       Balance Overall balance assessment: Modified Independent                                          Cognition  Arousal/Alertness: Awake/alert Behavior During Therapy: WFL for tasks assessed/performed Overall Cognitive Status: Within Functional Limits for tasks assessed                                        Exercises Total Joint Exercises Ankle Circles/Pumps: AROM;Both;10 reps Quad Sets: AROM;Left;Supine;10 reps Short Arc Quad: AROM;Left;10 reps;Supine Heel Slides: AAROM;Left;Supine;10 reps Hip ABduction/ADduction: AAROM;Left;10 reps;Supine Straight Leg Raises: AROM;AAROM;Left;10 reps;Supine Long Arc Quad: AROM;Left;10 reps;Seated Knee Flexion: AAROM;AROM;Left;10 reps;Seated Goniometric ROM: 0-80* AAROM L knee    General Comments        Pertinent Vitals/Pain Pain Score: 7  Pain Location: L knee Pain Descriptors / Indicators: Sore Pain Intervention(s): Limited activity within patient's tolerance;Monitored during session;Premedicated before session;Ice applied    Home Living                      Prior Function            PT Goals (current goals can now be found in the care plan section) Acute Rehab PT Goals Patient Stated Goal: return to walking without AD PT Goal Formulation: With patient Time For Goal Achievement: 08/14/19 Potential to Achieve Goals: Good Progress towards PT goals: Progressing toward goals    Frequency    7X/week      PT Plan Current plan remains appropriate    Co-evaluation  AM-PAC PT "6 Clicks" Mobility   Outcome Measure  Help needed turning from your back to your side while in a flat bed without using bedrails?: None Help needed moving from lying on your back to sitting on the side of a flat bed without using bedrails?: A Little Help needed moving to and from a bed to a chair (including a wheelchair)?: None Help needed standing up from a chair using your arms (e.g., wheelchair or bedside chair)?: None Help needed to walk in hospital room?: None Help needed climbing 3-5 steps with a railing? : A  Little 6 Click Score: 22    End of Session Equipment Utilized During Treatment: Gait belt Activity Tolerance: Patient tolerated treatment well Patient left: in chair;with call bell/phone within reach Nurse Communication: Mobility status PT Visit Diagnosis: Difficulty in walking, not elsewhere classified (R26.2);Pain Pain - Right/Left: Left Pain - part of body: Knee     Time: [id number] PT Time Calculation (min) (ACUTE ONLY): 29 min  Charges:  $Gait Training: 8-22 mins $Therapeutic Exercise: 8-22 mins                     Blondell Reveal Kistler PT 08/08/2019  Acute Rehabilitation Services Pager [phone] Office [phone]

## 2019-08-08 NOTE — Progress Notes (Signed)
Subjective: 1 Day Post-Op Procedure(s) (LRB): LEFT TOTAL KNEE ARTHROPLASTY (Left)   Patient feels well and is looking forward to going home today.  Activity level:  wbat Diet tolerance:  ok Voiding:  Foley out this morning. Patient reports pain as mild.    Objective: Vital signs in last 24 hours: Temp:  [97.5 F (36.4 C)-98.3 F (36.8 C)] 98.3 F (36.8 C) (10/14 0452) Pulse Rate:  [58-75] 64 (10/14 0452) Resp:  [12-18] 16 (10/14 0452) BP: (113-137)/(59-70) 124/68 (10/14 0452) SpO2:  [97 %-100 %] 98 % (10/14 0452)  Labs: No results for input(s): HGB in the last 72 hours. No results for input(s): WBC, RBC, HCT, PLT in the last 72 hours. No results for input(s): NA, K, CL, CO2, BUN, CREATININE, GLUCOSE, CALCIUM in the last 72 hours. No results for input(s): LABPT, INR in the last 72 hours.  Physical Exam:  Neurologically intact ABD soft Neurovascular intact Sensation intact distally Intact pulses distally Dorsiflexion/Plantar flexion intact Incision: dressing C/D/I and no drainage No cellulitis present Compartment soft  Assessment/Plan:  1 Day Post-Op Procedure(s) (LRB): LEFT TOTAL KNEE ARTHROPLASTY (Left) Advance diet Up with therapy Discharge home with home health today after PT. Continue on 81mg  asa BID x 2 weeks post op. Follow up in office 2 weeks post op.  Stacy Duncan 08/08/2019, 8:07 AM

## 2019-08-08 NOTE — Discharge Summary (Signed)
Patient ID: Stacy Duncan MRN: [id number] DOB/AGE: December 21, 1950 68 y.o.  Admit date: 08/07/2019 Discharge date: 08/08/2019  Admission Diagnoses:  Principal Problem:   Primary localized osteoarthritis of left knee Active Problems:   Primary osteoarthritis of left knee   Discharge Diagnoses:  Same  Past Medical History:  Diagnosis Date  . Cataract   . Depression   . Heart murmur   . Hypercholesteremia   . Hypertension     Surgeries: Procedure(s): LEFT TOTAL KNEE ARTHROPLASTY on 08/07/2019   Consultants:   Discharged Condition: Improved  Hospital Course: Stacy Duncan is an 67 y.o. female who was admitted 08/07/2019 for operative treatment ofPrimary localized osteoarthritis of left knee. Patient has severe unremitting pain that affects sleep, daily activities, and work/hobbies. After pre-op clearance the patient was taken to the operating room on 08/07/2019 and underwent  Procedure(s): LEFT TOTAL KNEE ARTHROPLASTY.    Patient was given perioperative antibiotics:  Anti-infectives (From admission, onward)   Start     Dose/Rate Route Frequency Ordered Stop   08/07/19 1330  ceFAZolin (ANCEF) IVPB 2g/100 mL premix     2 g 200 mL/hr over 30 Minutes Intravenous Every 6 hours 08/07/19 1018 08/07/19 2057   08/07/19 0600  ceFAZolin (ANCEF) IVPB 2g/100 mL premix     2 g 200 mL/hr over 30 Minutes Intravenous On call to O.R. 08/07/19 0535 08/07/19 0744       Patient was given sequential compression devices, early ambulation, and chemoprophylaxis to prevent DVT.  Patient benefited maximally from hospital stay and there were no complications.    Recent vital signs:  Patient Vitals for the past 24 hrs:  BP Temp Temp src Pulse Resp SpO2  08/08/19 0452 124/68 98.3 F (36.8 C) Oral 64 16 98 %  08/08/19 0101 131/69 97.9 F (36.6 C) Oral (!) 58 16 99 %  08/07/19 2122 119/63 98 F (36.7 C) Oral 64 16 97 %  08/07/19 1848 126/64 98.2 F (36.8 C) Oral 60 16 99 %  08/07/19 1330 133/68  97.7 F (36.5 C) Oral 75 16 99 %  08/07/19 1233 123/66 98 F (36.7 C) Oral 70 16 97 %  08/07/19 1137 (!) 127/59 (!) 97.5 F (36.4 C) Oral - - -  08/07/19 1021 137/70 97.6 F (36.4 C) Oral 64 15 97 %  08/07/19 1000 (!) 129/59 - - 63 13 98 %  08/07/19 0945 125/64 - - 64 18 100 %  08/07/19 0930 113/60 97.6 F (36.4 C) - 65 12 100 %     Recent laboratory studies: No results for input(s): WBC, HGB, HCT, PLT, NA, K, CL, CO2, BUN, CREATININE, GLUCOSE, INR, CALCIUM in the last 72 hours.  Invalid input(s): PT, 2   Discharge Medications:   Allergies as of 08/08/2019   No Known Allergies     Medication List    STOP taking these medications   meloxicam 15 MG tablet Commonly known as: MOBIC     TAKE these medications   acetaminophen 500 MG tablet Commonly known as: TYLENOL Take 500 mg by mouth every 6 (six) hours as needed for moderate pain.   aspirin EC 81 MG tablet Take 1 tablet (81 mg total) by mouth 2 (two) times daily after a meal. What changed: when to take this   chlorthalidone 25 MG tablet Commonly known as: HYGROTON Take 25 mg by mouth daily.   escitalopram 10 MG tablet Commonly known as: LEXAPRO Take 10 mg by mouth daily.   HYDROcodone-acetaminophen 5-325 MG tablet Commonly known  as: NORCO/VICODIN Take 1-2 tablets by mouth every 6 (six) hours as needed for moderate pain (pain score 4-6).   mometasone 50 MCG/ACT nasal spray Commonly known as: NASONEX Place 1 spray into the nose daily as needed (allergies.).   multivitamin with minerals Tabs tablet Take 1 tablet by mouth daily.   rosuvastatin 20 MG tablet Commonly known as: CRESTOR Take 20 mg by mouth daily.   tiZANidine 4 MG tablet Commonly known as: Zanaflex Take 1 tablet (4 mg total) by mouth every 6 (six) hours as needed.   Vitamin D (Ergocalciferol) 1.25 MG (50000 UT) Caps capsule Commonly known as: DRISDOL Take 50,000 Units by mouth every Sunday.            Durable Medical Equipment  (From  admission, onward)         Start     Ordered   08/07/19 1019  DME Walker rolling  Once    Question:  Patient needs a walker to treat with the following condition  Answer:  Primary osteoarthritis of left knee   08/07/19 1018   08/07/19 1019  DME 3 n 1  Once     10 /13/20 1018   08/07/19 1019  DME Bedside commode  Once    Question:  Patient needs a bedside commode to treat with the following condition  Answer:  Primary osteoarthritis of left knee   08/07/19 1018          Diagnostic Studies: Dg Chest 2 View  Result Date: 07/31/2019 CLINICAL DATA:  Preop left knee replacement EXAM: CHEST - 2 VIEW COMPARISON:  None. FINDINGS: The heart size and mediastinal contours are within normal limits. Both lungs are clear. The visualized skeletal structures are unremarkable. IMPRESSION: No active cardiopulmonary disease. Electronically Signed   By: Kathreen Devoid   On: 07/31/2019 15:53    Disposition: Discharge disposition: 01-Home or Self Care       Discharge Instructions    Call MD / Call 911   Complete by: As directed    If you experience chest pain or shortness of breath, CALL 911 and be transported to the hospital emergency room.  If you develope a fever above 101 F, pus (white drainage) or increased drainage or redness at the wound, or calf pain, call your surgeon's office.   Constipation Prevention   Complete by: As directed    Drink plenty of fluids.  Prune juice may be helpful.  You may use a stool softener, such as Colace (over the counter) 100 mg twice a day.  Use MiraLax (over the counter) for constipation as needed.   Diet - low sodium heart healthy   Complete by: As directed    Discharge instructions   Complete by: As directed    INSTRUCTIONS AFTER JOINT REPLACEMENT   Remove items at home which could result in a fall. This includes throw rugs or furniture in walking pathways ICE to the affected joint every three hours while awake for 30 minutes at a time, for at least the first  3-5 days, and then as needed for pain and swelling.  Continue to use ice for pain and swelling. You may notice swelling that will progress down to the foot and ankle.  This is normal after surgery.  Elevate your leg when you are not up walking on it.   Continue to use the breathing machine you got in the hospital (incentive spirometer) which will help keep your temperature down.  It is common for your temperature to  cycle up and down following surgery, especially at night when you are not up moving around and exerting yourself.  The breathing machine keeps your lungs expanded and your temperature down.   DIET:  As you were doing prior to hospitalization, we recommend a well-balanced diet.  DRESSING / WOUND CARE / SHOWERING  You may shower 3 days after surgery, but keep the wounds dry during showering.  You may use an occlusive plastic wrap (Press'n Seal for example), NO SOAKING/SUBMERGING IN THE BATHTUB.  If the bandage gets wet, change with a clean dry gauze.  If the incision gets wet, pat the wound dry with a clean towel.  ACTIVITY  Increase activity slowly as tolerated, but follow the weight bearing instructions below.   No driving for 6 weeks or until further direction given by your physician.  You cannot drive while taking narcotics.  No lifting or carrying greater than 10 lbs. until further directed by your surgeon. Avoid periods of inactivity such as sitting longer than an hour when not asleep. This helps prevent blood clots.  You may return to work once you are authorized by your doctor.     WEIGHT BEARING   Weight bearing as tolerated with assist device (walker, cane, etc) as directed, use it as long as suggested by your surgeon or therapist, typically at least 4-6 weeks.   EXERCISES  Results after joint replacement surgery are often greatly improved when you follow the exercise, range of motion and muscle strengthening exercises prescribed by your doctor. Safety measures are also  important to protect the joint from further injury. Any time any of these exercises cause you to have increased pain or swelling, decrease what you are doing until you are comfortable again and then slowly increase them. If you have problems or questions, call your caregiver or physical therapist for advice.   Rehabilitation is important following a joint replacement. After just a few days of immobilization, the muscles of the leg can become weakened and shrink (atrophy).  These exercises are designed to build up the tone and strength of the thigh and leg muscles and to improve motion. Often times heat used for twenty to thirty minutes before working out will loosen up your tissues and help with improving the range of motion but do not use heat for the first two weeks following surgery (sometimes heat can increase post-operative swelling).   These exercises can be done on a training (exercise) mat, on the floor, on a table or on a bed. Use whatever works the best and is most comfortable for you.    Use music or television while you are exercising so that the exercises are a pleasant break in your day. This will make your life better with the exercises acting as a break in your routine that you can look forward to.   Perform all exercises about fifteen times, three times per day or as directed.  You should exercise both the operative leg and the other leg as well.   Exercises include:   Quad Sets - Tighten up the muscle on the front of the thigh (Quad) and hold for 5-10 seconds.   Straight Leg Raises - With your knee straight (if you were given a brace, keep it on), lift the leg to 60 degrees, hold for 3 seconds, and slowly lower the leg.  Perform this exercise against resistance later as your leg gets stronger.  Leg Slides: Lying on your back, slowly slide your foot toward your buttocks,  bending your knee up off the floor (only go as far as is comfortable). Then slowly slide your foot back down until your  leg is flat on the floor again.  Angel Wings: Lying on your back spread your legs to the side as far apart as you can without causing discomfort.  Hamstring Strength:  Lying on your back, push your heel against the floor with your leg straight by tightening up the muscles of your buttocks.  Repeat, but this time bend your knee to a comfortable angle, and push your heel against the floor.  You may put a pillow under the heel to make it more comfortable if necessary.   A rehabilitation program following joint replacement surgery can speed recovery and prevent re-injury in the future due to weakened muscles. Contact your doctor or a physical therapist for more information on knee rehabilitation.    CONSTIPATION  Constipation is defined medically as fewer than three stools per week and severe constipation as less than one stool per week.  Even if you have a regular bowel pattern at home, your normal regimen is likely to be disrupted due to multiple reasons following surgery.  Combination of anesthesia, postoperative narcotics, change in appetite and fluid intake all can affect your bowels.   YOU MUST use at least one of the following options; they are listed in order of increasing strength to get the job done.  They are all available over the counter, and you may need to use some, POSSIBLY even all of these options:    Drink plenty of fluids (prune juice may be helpful) and high fiber foods Colace 100 mg by mouth twice a day  Senokot for constipation as directed and as needed Dulcolax (bisacodyl), take with full glass of water  Miralax (polyethylene glycol) once or twice a day as needed.  If you have tried all these things and are unable to have a bowel movement in the first 3-4 days after surgery call either your surgeon or your primary doctor.    If you experience loose stools or diarrhea, hold the medications until you stool forms back up.  If your symptoms do not get better within 1 week or if  they get worse, check with your doctor.  If you experience "the worst abdominal pain ever" or develop nausea or vomiting, please contact the office immediately for further recommendations for treatment.   ITCHING:  If you experience itching with your medications, try taking only a single pain pill, or even half a pain pill at a time.  You can also use Benadryl over the counter for itching or also to help with sleep.   TED HOSE STOCKINGS:  Use stockings on both legs until for at least 2 weeks or as directed by physician office. They may be removed at night for sleeping.  MEDICATIONS:  See your medication summary on the "After Visit Summary" that nursing will review with you.  You may have some home medications which will be placed on hold until you complete the course of blood thinner medication.  It is important for you to complete the blood thinner medication as prescribed.  PRECAUTIONS:  If you experience chest pain or shortness of breath - call 911 immediately for transfer to the hospital emergency department.   If you develop a fever greater that 101 F, purulent drainage from wound, increased redness or drainage from wound, foul odor from the wound/dressing, or calf pain - CONTACT YOUR SURGEON.  FOLLOW-UP APPOINTMENTS:  If you do not already have a post-op appointment, please call the office for an appointment to be seen by your surgeon.  Guidelines for how soon to be seen are listed in your "After Visit Summary", but are typically between 1-4 weeks after surgery.  OTHER INSTRUCTIONS:   Knee Replacement:  Do not place pillow under knee, focus on keeping the knee straight while resting. CPM instructions: 0-90 degrees, 2 hours in the morning, 2 hours in the afternoon, and 2 hours in the evening. Place foam block, curve side up under heel at all times except when in CPM or when walking.  DO NOT modify, tear, cut, or change the foam block in any  way.  MAKE SURE YOU:  Understand these instructions.  Get help right away if you are not doing well or get worse.    Thank you for letting us be a part of your medical care team.  It is a privilege we respect greatly.  We hope these instructions will help you stay on track for a fast and full recovery!   Increase activity slowly as tolerated   Complete by: As directed       Follow-up Information    Melrose Nakayama, MD. Go on 08/20/2019.   Specialty: Orthopedic Surgery Why: Your appointment has been scheduled for 1000.  Contact information: [place] [id number] [phone]        [facility] Follow up.   Specialty: [place] Why: You will be seen at home by [place] for 5 visits prior to starting outpatient physical therapy  Contact information: [place] [id number] [phone]        [facility] Go on 08/21/2019.   Why: You are scheduled to start outpatient physical therapy at 1010. Please arrive a few minutes early to complete your paperwork  Contact information: [phone]           Signed: Larwance Sachs Jhania Etherington 08/08/2019, 8:12 AM

## 2019-08-21 ENCOUNTER — Ambulatory Visit: Payer: Medicare Other | Attending: Orthopaedic Surgery | Admitting: Physical Therapy

## 2019-08-21 ENCOUNTER — Encounter: Payer: Self-pay | Admitting: Physical Therapy

## 2019-08-21 ENCOUNTER — Other Ambulatory Visit: Payer: Self-pay

## 2019-08-21 DIAGNOSIS — R262 Difficulty in walking, not elsewhere classified: Secondary | ICD-10-CM

## 2019-08-21 DIAGNOSIS — R600 Localized edema: Secondary | ICD-10-CM

## 2019-08-21 DIAGNOSIS — M25662 Stiffness of left knee, not elsewhere classified: Secondary | ICD-10-CM

## 2019-08-21 DIAGNOSIS — M25562 Pain in left knee: Secondary | ICD-10-CM | POA: Diagnosis present

## 2019-08-21 NOTE — Therapy (Signed)
[facility] [address]  [place], [place], [address] Phone: [phone]   Fax:  [phone]  Physical Therapy Evaluation  Patient Details  Name: Stacy Duncan MRN: [id number] Date of Birth: 1951/04/27 Referring Provider (PT): Melrose Nakayama, MD   Encounter Date: 08/21/2019  PT End of Session - 08/21/19 1042    Visit Number  1    Number of Visits  13    Date for PT Re-Evaluation  10/02/19    Authorization Type  Medicare & Federal BCBS    PT Start Time  1001    PT Stop Time  1035    PT Time Calculation (min)  34 min    Activity Tolerance  Patient tolerated treatment well;Patient limited by pain    Behavior During Therapy  [facility] for tasks assessed/performed       Past Medical History:  Diagnosis Date  . Cataract   . Depression   . Heart murmur   . Hypercholesteremia   . Hypertension     Past Surgical History:  Procedure Laterality Date  . ANKLE SURGERY Right   . BREAST LUMPECTOMY Left    mid 47s  . COLONOSCOPY  07/31/2013  . KNEE SURGERY Left   . POLYPECTOMY    . TOTAL KNEE ARTHROPLASTY Right 11/21/2018   Procedure: TOTAL KNEE ARTHROPLASTY;  Surgeon: Melrose Nakayama, MD;  Location: [facility];  Service: Orthopedics;  Laterality: Right;  . TOTAL KNEE ARTHROPLASTY Left 08/07/2019   Procedure: LEFT TOTAL KNEE ARTHROPLASTY;  Surgeon: Melrose Nakayama, MD;  Location: [facility];  Service: Orthopedics;  Laterality: Left;    There were no vitals filed for this visit.   Subjective Assessment - 08/21/19 1002    Subjective  Patient reports undergoing L TKA on 08/07/19. Was walking with RW after surgery but, has since transitioned to [facility] a few days ago. Had HHPT for 5 days. Pain levels have been diffuse and range from 4-7/10. Would like to get back to walking on a trail 3x a week for 1/2-1 mile like she was doing at [facility].    Pertinent History  HTN, HLD, depression, cataract, L knee surgery, L breast lumpectomy, R ankle  surgery    Limitations  Sitting;Lifting;Standing;Walking;House hold activities    How long can you sit comfortably?  45 min    How long can you stand comfortably?  20 min    How long can you walk comfortably?  10 min    Diagnostic tests  none recent    Patient Stated Goals  get back to walking    Currently in Pain?  Yes    Pain Score  6     Pain Location  Knee    Pain Orientation  Left;Posterior;Anterior    Pain Descriptors / Indicators  Sharp    Pain Type  Acute pain;Surgical pain         [facility] PT Assessment - 08/21/19 1015      Assessment   Medical Diagnosis  s/p L TKA    Referring Provider (PT)  Melrose Nakayama, MD    Onset Date/Surgical Date  08/07/19    Next MD Visit  09/10/19    Prior Therapy  yes      Precautions   Precautions  None      Restrictions   Other Position/Activity Restrictions  L LE WBAT      Balance Screen   Has the patient fallen in the past 6 months  No  Has the patient had a decrease in activity level because of a fear of falling?   No    Is the patient reluctant to leave their home because of a fear of falling?   No      Home Social worker  Private residence    Living Arrangements  Non-relatives/Friends    Available Help at Discharge  Friend(s)    Type of [place] to enter    Entrance Stairs-Number of Steps  3    [place]  One level    [place] - 2 wheels;[place]ud[place] - single point      Prior Function   Level of Independence  Independent    Vocation  Retired    Leisure  walking on trails      Cognition   Overall Cognitive Status  Within Functional Limits for tasks assessed      Observation/Other Assessments   Observations  L knee incision covered with bandage; mild diffuse edema; L calf supple and nontender; moderate pitting edema evident over L anterior shin    Focus on Therapeutic Outcomes (FOTO)   Knee: 56 (44% limited, 23% predicted)       Sensation   Light Touch  Appears Intact   reports chronic L hand tingling      Coordination   Gross Motor Movements are Fluid and Coordinated  Yes      Posture/Postural Control   Posture/Postural Control  Postural limitations    Postural Limitations  Rounded Shoulders;Posterior pelvic tilt      ROM / Strength   AROM / PROM / Strength  AROM;PROM;Strength      AROM   AROM Assessment Site  Knee    Right/Left Knee  Right;Left    Right Knee Extension  0    Right Knee Flexion  121    Left Knee Extension  6    Left Knee Flexion  100      PROM   PROM Assessment Site  Knee    Right/Left Knee  Right;Left    Right Knee Extension  0    Right Knee Flexion  124   pain   Left Knee Extension  6    Left Knee Flexion  103      Strength   Strength Assessment Site  Hip;Knee;Ankle    Right/Left Hip  Right;Left    Right Hip Flexion  4/5    Right Hip ABduction  4/5    Right Hip ADduction  4/5    Left Hip Flexion  4/5    Left Hip ABduction  4/5    Left Hip ADduction  4/5   pain in medial knee   Right/Left Knee  Right;Left    Right Knee Flexion  4+/5    Right Knee Extension  4+/5    Left Knee Flexion  3+/5    Left Knee Extension  3+/5    Right/Left Ankle  Right;Left    Right Ankle Dorsiflexion  4/5    Right Ankle Plantar Flexion  4+/5    Left Ankle Dorsiflexion  4/5    Left Ankle Plantar Flexion  4+/5      Palpation   Patella mobility  unable to assess d/t bandaging     Palpation comment  TTP over medial and lateral knee; soft tissue restriction in distal HS      Ambulation/Gait   Assistive device  Straight cane    Gait Pattern  Step-through pattern;Step-to pattern;Decreased stance time - left;Decreased step length - right;Decreased weight shift to left;Decreased hip/knee flexion - left;Trunk flexed    Ambulation Surface  Level;Indoor    Gait velocity  decreased                Objective measurements completed on examination: See above findings.               PT Education - 08/21/19 1041    Education Details  prognosis, POC, HEP    Person(s) Educated  Patient    Methods  Explanation;Demonstration;Tactile cues;Verbal cues;Handout    Comprehension  Verbalized understanding       PT Short Term Goals - 08/21/19 1048      PT SHORT TERM GOAL #1   Title  Patient to be independent with initial HEP.    Time  3    Period  Weeks    Status  New    Target Date  09/11/19        PT Long Term Goals - 08/21/19 1048      PT LONG TERM GOAL #1   Title  Patient to be independent with advanced HEP.    Time  6    Period  Weeks    Status  New    Target Date  10/02/19      PT LONG TERM GOAL #2   Title  Patient to demonstrate L knee AROM 0-120 degrees.    Time  6    Period  Weeks    Status  New    Target Date  10/02/19      PT LONG TERM GOAL #3   Title  Patient to demonstrate B LE strength >=4+/5.     Time  6    Period  Weeks    Status  New    Target Date  10/02/19      PT LONG TERM GOAL #4   Title  Patient to demonstrate symmetrical strep length, weight shift, and knee flexion with LRAD.    Time  6    Period  Weeks    Status  New    Target Date  10/02/19      PT LONG TERM GOAL #5   Title  Patient to report tolerance of walking 1/2 mile without pain limiting.    Time  6    Period  Weeks    Status  New    Target Date  10/02/19             Plan - 08/21/19 1042    Clinical Impression Statement  Patient is a 67y/o F presenting to OPPT with c/o L knee pain s/p L TKA on 08/07/19. Patient has transitioned from RW to [facility] a couple days ago. Pain levels have been managed with meds and range from 4-7/10. Patient would like to return to walking  to 1 mile 3x/week on a trail as she did at [facility]. Patient today demonstrating limited and painful L knee ROM, decreased LE strength, gait deviations, edema over L knee with pitting in anterior shin, TTP over L medial and lateral knee, and increased soft tissue restriction in  HS. L knee without evident drainage, redness, warmth, or calf tenderness. Educated patient on gentle stretching and WBing HEP- patient reported understanding. Would benefit from skilled PT services 2x/week for 6 weeks to address aforementioned impairments.    Personal Factors and Comorbidities  Age;Comorbidity 3+;Time since onset of injury/illness/exacerbation;Fitness;Past/Current  Experience    Comorbidities  HTN, HLD, depression, cataract, L knee surgery, L breast lumpectomy, R ankle surgery    Examination-Activity Limitations  Sit;Sleep;Bend;Squat;Stairs;Stand;Carry;Toileting;Transfers;Dressing;Hygiene/Grooming;Lift;Locomotion Level    Examination-Participation Restrictions  Church;Cleaning;Shop;Community Activity;Driving;Yard Work;Interpersonal Relationship;Laundry;Meal Prep    Stability/Clinical Decision Making  Stable/Uncomplicated    Clinical Decision Making  Low    Rehab Potential  Good    PT Frequency  2x / week    PT Duration  6 weeks    PT Treatment/Interventions  ADLs/Self Care Home Management;Cryotherapy;Electrical Stimulation;Moist Heat;Balance training;Therapeutic exercise;Therapeutic activities;Functional mobility training;Stair training;Gait training;DME Instruction;Ultrasound;Neuromuscular re-education;Patient/family education;Manual techniques;Vasopneumatic Device;Taping;Energy conservation;Dry needling;Passive range of motion;Scar mobilization    PT Next Visit Plan  reassess HEP    Consulted and Agree with Plan of Care  Patient       Patient will benefit from skilled therapeutic intervention in order to improve the following deficits and impairments:  Increased edema, Decreased scar mobility, Decreased activity tolerance, Decreased strength, Pain, Difficulty walking, Decreased balance, Decreased range of motion, Improper body mechanics, Postural dysfunction, Impaired flexibility  Visit Diagnosis: Acute pain of left knee  Stiffness of left knee, not elsewhere  classified  Difficulty in walking, not elsewhere classified  Localized edema     Problem List Patient Active Problem List   Diagnosis Date Noted  . Primary localized osteoarthritis of left knee 08/07/2019  . Primary osteoarthritis of left knee 08/07/2019  . Primary localized osteoarthritis of right knee 11/21/2018  . Primary osteoarthritis of right knee 11/21/2018  . Personal history of colonic adenoma 08/08/2013  . Family history of colon cancer - elderly mother 08/08/2013      Janene Harvey, PT, DPT 08/21/19 10:50 AM   [facility] [address]  [place], [place], [address] Phone: [phone]   Fax:  [phone]  Name: Stacy Duncan MRN: [id number] Date of Birth: 12/14/1950

## 2019-08-23 ENCOUNTER — Ambulatory Visit: Payer: Medicare Other | Admitting: Physical Therapy

## 2019-08-23 ENCOUNTER — Encounter: Payer: Self-pay | Admitting: Physical Therapy

## 2019-08-23 ENCOUNTER — Other Ambulatory Visit: Payer: Self-pay

## 2019-08-23 DIAGNOSIS — R600 Localized edema: Secondary | ICD-10-CM

## 2019-08-23 DIAGNOSIS — M25662 Stiffness of left knee, not elsewhere classified: Secondary | ICD-10-CM

## 2019-08-23 DIAGNOSIS — M25562 Pain in left knee: Secondary | ICD-10-CM | POA: Diagnosis not present

## 2019-08-23 DIAGNOSIS — R262 Difficulty in walking, not elsewhere classified: Secondary | ICD-10-CM

## 2019-08-23 NOTE — Therapy (Signed)
[facility] [address]  [place], [place], [address] Phone: [phone]   Fax:  [phone]  Physical Therapy Treatment  Patient Details  Name: Stacy Duncan MRN: [id number] Date of Birth: Jul 18, 1951 Referring Provider (PT): Melrose Nakayama, MD   Encounter Date: 08/23/2019  PT End of Session - 08/23/19 1126    Visit Number  2    Number of Visits  13    Date for PT Re-Evaluation  10/02/19    Authorization Type  Medicare & Federal BCBS    PT Start Time  1046    PT Stop Time  1139    PT Time Calculation (min)  53 min    Activity Tolerance  Patient tolerated treatment well;Patient limited by pain    Behavior During Therapy  [facility] for tasks assessed/performed       Past Medical History:  Diagnosis Date  . Cataract   . Depression   . Heart murmur   . Hypercholesteremia   . Hypertension     Past Surgical History:  Procedure Laterality Date  . ANKLE SURGERY Right   . BREAST LUMPECTOMY Left    mid 54s  . COLONOSCOPY  07/31/2013  . KNEE SURGERY Left   . POLYPECTOMY    . TOTAL KNEE ARTHROPLASTY Right 11/21/2018   Procedure: TOTAL KNEE ARTHROPLASTY;  Surgeon: Melrose Nakayama, MD;  Location: [facility];  Service: Orthopedics;  Laterality: Right;  . TOTAL KNEE ARTHROPLASTY Left 08/07/2019   Procedure: LEFT TOTAL KNEE ARTHROPLASTY;  Surgeon: Melrose Nakayama, MD;  Location: [facility];  Service: Orthopedics;  Laterality: Left;    There were no vitals filed for this visit.  Subjective Assessment - 08/23/19 1045    Subjective  Went to a neuro-massage therapist for the edema in her leg. Also did some taping which she felt she benefited from.    Pertinent History  HTN, HLD, depression, cataract, L knee surgery, L breast lumpectomy, R ankle surgery    Diagnostic tests  none recent    Patient Stated Goals  get back to walking    Currently in Pain?  Yes    Pain Score  5     Pain Location  Knee    Pain Orientation  Left    Pain  Descriptors / Indicators  Sharp    Pain Type  Acute pain;Surgical pain                       [facility] Adult PT Treatment/Exercise - 08/23/19 0001      Exercises   Exercises  Knee/Hip      Knee/Hip Exercises: Stretches   Passive Hamstring Stretch  Left;2 reps;30 seconds    Passive Hamstring Stretch Limitations  supine with strap    Hip Flexor Stretch  Left;2 reps;30 seconds    Hip Flexor Stretch Limitations  mod thomas with strap      Knee/Hip Exercises: Aerobic   Recumbent Bike  L1 x 5.5 min      Knee/Hip Exercises: Standing   Heel Raises  Both;1 set;15 reps    Heel Raises Limitations  at TM rail    Functional Squat  1 set;10 reps    Functional Squat Limitations  mini squat at TM rail   cues to widen BOS     Knee/Hip Exercises: Seated   Long Arc Quad  Strengthening;Left;1 set;10 reps    Long Arc Quad Limitations  2x10; 1st set 0#, 2nd set 1#  Hamstring Curl  Strengthening;Left;1 set;10 reps    Hamstring Limitations  2x10; green band      Knee/Hip Exercises: Supine   Heel Slides  AAROM;Left;1 set;10 reps    Heel Slides Limitations  10x5" with orange pball and strap    Bridges  Strengthening;1 set;10 reps    Bridges Limitations  cues to slow down to decrease strain on quad/hip flexor    Straight Leg Raises  Strengthening;Left;1 set;10 reps    Straight Leg Raises Limitations  good form & no quad lag      Modalities   Modalities  Vasopneumatic      Vasopneumatic   Number Minutes Vasopneumatic   15 minutes    Vasopnuematic Location   Knee   L   Vasopneumatic Pressure  Medium    Vasopneumatic Temperature   coldest temp.               PT Education - 08/23/19 1125    Education Details  edu on avoiding soaking L knee incision and removal of KT tape after 3 days of wear    Person(s) Educated  Patient    Methods  Explanation;Tactile cues;Demonstration    Comprehension  Verbalized understanding       PT Short Term Goals - 08/23/19 1129      PT  SHORT TERM GOAL #1   Title  Patient to be independent with initial HEP.    Time  3    Period  Weeks    Status  On-going    Target Date  09/11/19        PT Long Term Goals - 08/23/19 1129      PT LONG TERM GOAL #1   Title  Patient to be independent with advanced HEP.    Time  6    Period  Weeks    Status  On-going      PT LONG TERM GOAL #2   Title  Patient to demonstrate L knee AROM 0-120 degrees.    Time  6    Period  Weeks    Status  On-going      PT LONG TERM GOAL #3   Title  Patient to demonstrate B LE strength >=4+/5.     Time  6    Period  Weeks    Status  On-going      PT LONG TERM GOAL #4   Title  Patient to demonstrate symmetrical strep length, weight shift, and knee flexion with LRAD.    Time  6    Period  Weeks    Status  On-going      PT LONG TERM GOAL #5   Title  Patient to report tolerance of walking 1/2 mile without pain limiting.    Time  6    Period  Weeks    Status  On-going            Plan - 08/23/19 1127    Clinical Impression Statement  Patient reported that she went to see a neuro-massage therapist for the edema in her L lower leg. Also has her leg taped which seemed to help. L knee bandage surrounded by KT tape, with mild residual pitting edema remaining over L anterior shin. Patient demonstrated good L knee flexion ROM with heel slides today. Patient tolerated gentle LE stretching within tolerance. Patient demonstrated good form with SLRs with no quad lag evident. Required cues to decrease speed with bridges as to avoid paint's c/o strain in L quad and  hip flexor. Able to increase weight with LAQ today d/t good form. Patient mentioned wanting to take a bubble bath- educated patient on avoiding soaking incision until it is more well-healed. Patient reported understanding. Ended session with [employer] to L knee for edema and pain relief. No complaints at end of session.    Comorbidities  HTN, HLD, depression, cataract, L knee surgery, L breast  lumpectomy, R ankle surgery    PT Treatment/Interventions  ADLs/Self Care Home Management;Cryotherapy;Electrical Stimulation;Moist Heat;Balance training;Therapeutic exercise;Therapeutic activities;Functional mobility training;Stair training;Gait training;DME Instruction;Ultrasound;Neuromuscular re-education;Patient/family education;Manual techniques;Vasopneumatic Device;Taping;Energy conservation;Dry needling;Passive range of motion;Scar mobilization    PT Next Visit Plan  gait training without SPC, LE strengthening and stretching    Consulted and Agree with Plan of Care  Patient       Patient will benefit from skilled therapeutic intervention in order to improve the following deficits and impairments:  Increased edema, Decreased scar mobility, Decreased activity tolerance, Decreased strength, Pain, Difficulty walking, Decreased balance, Decreased range of motion, Improper body mechanics, Postural dysfunction, Impaired flexibility  Visit Diagnosis: Acute pain of left knee  Stiffness of left knee, not elsewhere classified  Difficulty in walking, not elsewhere classified  Localized edema     Problem List Patient Active Problem List   Diagnosis Date Noted  . Primary localized osteoarthritis of left knee 08/07/2019  . Primary osteoarthritis of left knee 08/07/2019  . Primary localized osteoarthritis of right knee 11/21/2018  . Primary osteoarthritis of right knee 11/21/2018  . Personal history of colonic adenoma 08/08/2013  . Family history of colon cancer - elderly mother 08/08/2013      Janene Harvey, PT, DPT 08/23/19 11:41 AM   [facility] [address]  [place], [place], [address] Phone: [phone]   Fax:  [phone]  Name: Stacy Duncan MRN: [id number] Date of Birth: 1950-12-20

## 2019-08-27 ENCOUNTER — Ambulatory Visit: Payer: Medicare Other | Attending: Orthopaedic Surgery

## 2019-08-27 ENCOUNTER — Other Ambulatory Visit: Payer: Self-pay

## 2019-08-27 DIAGNOSIS — R600 Localized edema: Secondary | ICD-10-CM | POA: Insufficient documentation

## 2019-08-27 DIAGNOSIS — R262 Difficulty in walking, not elsewhere classified: Secondary | ICD-10-CM | POA: Diagnosis present

## 2019-08-27 DIAGNOSIS — M6281 Muscle weakness (generalized): Secondary | ICD-10-CM | POA: Diagnosis present

## 2019-08-27 DIAGNOSIS — M25662 Stiffness of left knee, not elsewhere classified: Secondary | ICD-10-CM | POA: Insufficient documentation

## 2019-08-27 DIAGNOSIS — M25562 Pain in left knee: Secondary | ICD-10-CM | POA: Diagnosis not present

## 2019-08-27 NOTE — Therapy (Signed)
[facility] [address]  [place]  [place], [place], [address] Phone: [phone]   Fax:  [phone]  Physical Therapy Treatment  Patient Details  Name: Stacy Duncan MRN: [id number] Date of Birth: 1951-06-12 Referring Provider (PT): Melrose Nakayama, MD   Encounter Date: 08/27/2019  PT End of Session - 08/27/19 1110    Visit Number  3    Number of Visits  13    Date for PT Re-Evaluation  10/02/19    Authorization Type  Medicare & Federal BCBS    PT Start Time  1104    PT Stop Time  1202    PT Time Calculation (min)  58 min    Activity Tolerance  Patient tolerated treatment well;Patient limited by pain    Behavior During Therapy  [facility] for tasks assessed/performed       Past Medical History:  Diagnosis Date  . Cataract   . Depression   . Heart murmur   . Hypercholesteremia   . Hypertension     Past Surgical History:  Procedure Laterality Date  . ANKLE SURGERY Right   . BREAST LUMPECTOMY Left    mid 27s  . COLONOSCOPY  07/31/2013  . KNEE SURGERY Left   . POLYPECTOMY    . TOTAL KNEE ARTHROPLASTY Right 11/21/2018   Procedure: TOTAL KNEE ARTHROPLASTY;  Surgeon: Melrose Nakayama, MD;  Location: [place];  Service: Orthopedics;  Laterality: Right;  . TOTAL KNEE ARTHROPLASTY Left 08/07/2019   Procedure: LEFT TOTAL KNEE ARTHROPLASTY;  Surgeon: Melrose Nakayama, MD;  Location: [facility];  Service: Orthopedics;  Laterality: Left;    There were no vitals filed for this visit.  Subjective Assessment - 08/27/19 1107    Subjective  Pt. doing well.    Pertinent History  HTN, HLD, depression, cataract, L knee surgery, L breast lumpectomy, R ankle surgery    Diagnostic tests  none recent    Patient Stated Goals  get back to walking    Currently in Pain?  Yes    Pain Score  4     Pain Location  Knee    Pain Orientation  Left    Pain Descriptors / Indicators  Sharp    Pain Type  Acute pain;Surgical pain    Pain Frequency   Intermittent         [facility] PT Assessment - 08/27/19 0001      Assessment   Medical Diagnosis  s/p L TKA    Referring Provider (PT)  Melrose Nakayama, MD    Onset Date/Surgical Date  08/07/19    Next MD Visit  09/10/19    Prior Therapy  yes                   [facility] Adult PT Treatment/Exercise - 08/27/19 0001      Knee/Hip Exercises: Stretches   Passive Hamstring Stretch  Left;2 reps;30 seconds    Passive Hamstring Stretch Limitations  supine with strap    Hip Flexor Stretch  Left;2 reps;30 seconds    Hip Flexor Stretch Limitations  mod thomas with strap      Knee/Hip Exercises: Aerobic   Recumbent Bike  Lvl 1, 6 min - full revolutions       Knee/Hip Exercises: Standing   Knee Flexion  Right;Left;10 reps;Strengthening    Knee Flexion Limitations  at counter     Terminal Knee Extension  Left;15 reps;Theraband;Strengthening    Theraband Level (Terminal Knee Extension)  Level 4 (Blue)  Terminal Knee Extension Limitations  TKE therapist anchored    Forward Step Up  Left;10 reps;Hand Hold: 1;Step Height: 4"    Forward Step Up Limitations  cues for slow eccentric step back      Knee/Hip Exercises: Seated   Sit to Sand  10 reps;without UE support   from mat table      Knee/Hip Exercises: Supine   Heel Slides  AAROM;Left;1 set;10 reps    Heel Slides Limitations  10x5" with orange pball and strap    Bridges  Both   x 12 reps   Straight Leg Raises  Left;15 reps;Strengthening    Straight Leg Raises Limitations  no quad lag    Knee Flexion  Left;AAROM;10 reps    Knee Flexion Limitations  HS curl with heels resting on peanut p-ball       Vasopneumatic   Number Minutes Vasopneumatic   10 minutes    Vasopnuematic Location   Knee   L   Vasopneumatic Pressure  Medium    Vasopneumatic Temperature   coldest temp.        Manual Therapy   Manual Therapy  Joint mobilization    Manual therapy comments  seated     Joint Mobilization  L patellar mobs grade III - all  directions for improved ROM - mod restriction                PT Short Term Goals - 08/23/19 1129      PT SHORT TERM GOAL #1   Title  Patient to be independent with initial HEP.    Time  3    Period  Weeks    Status  On-going    Target Date  09/11/19        PT Long Term Goals - 08/23/19 1129      PT LONG TERM GOAL #1   Title  Patient to be independent with advanced HEP.    Time  6    Period  Weeks    Status  On-going      PT LONG TERM GOAL #2   Title  Patient to demonstrate L knee AROM 0-120 degrees.    Time  6    Period  Weeks    Status  On-going      PT LONG TERM GOAL #3   Title  Patient to demonstrate B LE strength >=4+/5.     Time  6    Period  Weeks    Status  On-going      PT LONG TERM GOAL #4   Title  Patient to demonstrate symmetrical strep length, weight shift, and knee flexion with LRAD.    Time  6    Period  Weeks    Status  On-going      PT LONG TERM GOAL #5   Title  Patient to report tolerance of walking 1/2 mile without pain limiting.    Time  6    Period  Weeks    Status  On-going            Plan - 08/27/19 1111    Clinical Impression Statement  Pt. doing well today with no new complaints.  Reports she came into session today without SPC because she felt, "so good".  Did encourage pt. to use SPC until further notice from therapist as to reduce L knee strain.  Tolerated addition of standing HS curl, sit<>stand from mat table and 4" forward step-up well without pain increase.  Pt.  reporting L knee pain well controlled at this point and reports daily adherence to HEP.  Ended visit with ice/compression to L knee to reduce post-exercise swelling and pain.  Pt. L knee ROM seems to be progressing although not formally measured today.    Personal Factors and Comorbidities  Age;Comorbidity 3+;Time since onset of injury/illness/exacerbation;Fitness;Past/Current Experience    Comorbidities  HTN, HLD, depression, cataract, L knee surgery, L breast  lumpectomy, R ankle surgery    Rehab Potential  Good    PT Treatment/Interventions  ADLs/Self Care Home Management;Cryotherapy;Electrical Stimulation;Moist Heat;Balance training;Therapeutic exercise;Therapeutic activities;Functional mobility training;Stair training;Gait training;DME Instruction;Ultrasound;Neuromuscular re-education;Patient/family education;Manual techniques;Vasopneumatic Device;Taping;Energy conservation;Dry needling;Passive range of motion;Scar mobilization    PT Next Visit Plan  gait training without SPC, LE strengthening and stretching    Consulted and Agree with Plan of Care  Patient       Patient will benefit from skilled therapeutic intervention in order to improve the following deficits and impairments:  Increased edema, Decreased scar mobility, Decreased activity tolerance, Decreased strength, Pain, Difficulty walking, Decreased balance, Decreased range of motion, Improper body mechanics, Postural dysfunction, Impaired flexibility  Visit Diagnosis: Acute pain of left knee  Stiffness of left knee, not elsewhere classified  Difficulty in walking, not elsewhere classified  Localized edema     Problem List Patient Active Problem List   Diagnosis Date Noted  . Primary localized osteoarthritis of left knee 08/07/2019  . Primary osteoarthritis of left knee 08/07/2019  . Primary localized osteoarthritis of right knee 11/21/2018  . Primary osteoarthritis of right knee 11/21/2018  . Personal history of colonic adenoma 08/08/2013  . Family history of colon cancer - elderly mother 08/08/2013    Bess Harvest, PTA 08/27/19 12:11 PM   [facility] [address]  [place], [place], [address] Phone: [phone]   Fax:  [phone]  Name: Stacy Duncan MRN: [id number] Date of Birth: 13-Aug-1951

## 2019-08-30 ENCOUNTER — Other Ambulatory Visit: Payer: Self-pay

## 2019-08-30 ENCOUNTER — Ambulatory Visit: Payer: Medicare Other | Admitting: Physical Therapy

## 2019-08-30 ENCOUNTER — Encounter: Payer: Self-pay | Admitting: Physical Therapy

## 2019-08-30 DIAGNOSIS — M25562 Pain in left knee: Secondary | ICD-10-CM

## 2019-08-30 DIAGNOSIS — R262 Difficulty in walking, not elsewhere classified: Secondary | ICD-10-CM

## 2019-08-30 DIAGNOSIS — R600 Localized edema: Secondary | ICD-10-CM

## 2019-08-30 DIAGNOSIS — M25662 Stiffness of left knee, not elsewhere classified: Secondary | ICD-10-CM

## 2019-08-30 NOTE — Therapy (Signed)
[facility] [address]  [place], [place], [address] Phone: [phone]   Fax:  [phone]  Physical Therapy Treatment  Patient Details  Name: Stacy Duncan MRN: [id number] Date of Birth: Mar 16, 1951 Referring Provider (PT): Melrose Nakayama, MD   Encounter Date: 08/30/2019  PT End of Session - 08/30/19 1150    Visit Number  4    Number of Visits  13    Date for PT Re-Evaluation  10/02/19    Authorization Type  Medicare & Federal BCBS    PT Start Time  1059    PT Stop Time  1153    PT Time Calculation (min)  54 min    Equipment Utilized During Treatment  Gait belt    Activity Tolerance  Patient tolerated treatment well;Patient limited by pain    Behavior During Therapy  [facility] for tasks assessed/performed       Past Medical History:  Diagnosis Date  . Cataract   . Depression   . Heart murmur   . Hypercholesteremia   . Hypertension     Past Surgical History:  Procedure Laterality Date  . ANKLE SURGERY Right   . BREAST LUMPECTOMY Left    mid 50s  . COLONOSCOPY  07/31/2013  . KNEE SURGERY Left   . POLYPECTOMY    . 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 TOTAL KNEE ARTHROPLASTY Left 08/07/2019   Procedure: LEFT TOTAL KNEE ARTHROPLASTY;  Surgeon: Melrose Nakayama, MD;  Location[facility];  Service: Orthopedics;  Laterality: Left;    There were no vitals filed for this visit.  Subjective Assessment - 08/30/19 1102    Subjective  Took her bandage off for a couplke days but found that it was getting irritated, so she wrapped it up again. Feels that her edema is getting better- wearing ted hose on L leg today.    Pertinent History  HTN, HLD, depression, cataract, L knee surgery, L breast lumpectomy, R ankle surgery    Diagnostic tests  none recent    Patient Stated Goals  get back to  walking    Currently in Pain?  Yes    Pain Score  4    4.5   Pain Location  Knee    Pain Orientation  Left    Pain Descriptors / Indicators  Sharp    Pain Type  Acute pain;Surgical pain                       [facility] Adult PT Treatment/Exercise - 08/30/19 0001      Ambulation/Gait   Ambulation Distance (Feet)  180 Feet    Assistive device  None    Gait Pattern  Step-through pattern;Step-to pattern;Decreased stance time - left;Decreased step length - right;Decreased weight shift to left;Decreased hip/knee flexion - left    Ambulation Surface  Level;Indoor    Gait Comments  good L knee flexion during swing through and heel-toe pattern; still with remaining decreased R step length      Knee/Hip Exercises: Stretches   Passive Hamstring Stretch  Left;2 reps;30 seconds    Passive Hamstring Stretch Limitations  supine with strap    Gastroc Stretch  Right;Left;1 rep;30 seconds    Gastroc Stretch Limitations  at [employer] rail      Knee/Hip Exercises: Aerobic   Recumbent Bike  Lvl 1,  6 min - full revolutions       Knee/Hip Exercises: Standing   Hip Flexion  Stengthening;Both;1 set;20 reps;Knee bent    Hip Flexion Limitations  alr resisted marching with red TB around toes   at TM rail   Hip Abduction  Stengthening;Right;Left;1 set;Knee straight    Abduction Limitations  2# at TM rail    Hip Extension  Stengthening;Right;Left;1 set;10 reps;Knee straight    Extension Limitations  2# at [employer] Step Up  Left;Hand Hold: 1;Step Height: 4";2 sets;5 reps;Step Height: 6"    Forward Step Up Limitations  good eccentric control- increased reliance on UE support on TM rail    Functional Squat  1 set;10 reps    Functional Squat Limitations  mini squat with red loop above knees at TM rail   good alignment    Other Standing Knee Exercises  L LE anterior step up + over with 4" step and 1 UE on TM rail x10   c/o knee pain and intermittent hip drop as compensation     Knee/Hip  Exercises: Supine   Heel Slides  AAROM;Left;1 set;10 reps    Heel Slides Limitations  10x5" with orange pball and strap    Straight Leg Raises  Strengthening;Left;1 set;10 reps    Straight Leg Raises Limitations  1#; no quad lag      Vasopneumatic   Number Minutes Vasopneumatic   10 minutes    Vasopnuematic Location   Knee   L   Vasopneumatic Pressure  Medium    Vasopneumatic Temperature   coldest temp.        Manual Therapy   Manual Therapy  Joint mobilization    Manual therapy comments  supine    Joint Mobilization  L patellar mobs grade III in all directions- moderately hypomobile up/down               PT Short Term Goals - 08/30/19 1151      PT SHORT TERM GOAL #1   Title  Patient to be independent with initial HEP.    Time  3    Period  Weeks    Status  Achieved    Target Date  09/11/19        PT Long Term Goals - 08/23/19 1129      PT LONG TERM GOAL #1   Title  Patient to be independent with advanced HEP.    Time  6    Period  Weeks    Status  On-going      PT LONG TERM GOAL #2   Title  Patient to demonstrate L knee AROM 0-120 degrees.    Time  6    Period  Weeks    Status  On-going      PT LONG TERM GOAL #3   Title  Patient to demonstrate B LE strength >=4+/5.     Time  6    Period  Weeks    Status  On-going      PT LONG TERM GOAL #4   Title  Patient to demonstrate symmetrical strep length, weight shift, and knee flexion with LRAD.    Time  6    Period  Weeks    Status  On-going      PT LONG TERM GOAL #5   Title  Patient to report tolerance of walking 1/2 mile without pain limiting.    Time  6    Period  Weeks  Status  On-going            Plan - 08/30/19 1150    Clinical Impression Statement  Patient reporting no new complaints. Upon inspection, patient without remaining pitting edema in L lower leg. L knee incision well-healing. Worked on patellar mobility with patient demonstrating good movement in mediolateral direction,  moderate restriction in superoinferior direction. Worked on standing hip strengthening with patient able to complete these exercises with ease and good form. Increased height of step with step up/downs with patient demonstrating good form. More pain and compensation from hips with step up/step through. Worked on gait training without SPC with patient demonstrating good L knee flexion during swing through and heel-toe pattern; still with remaining decreased R step length. Ended session with Gameready to L knee for pain and edema relief. No complaints at end of session. Patient continues to demonstrate improvement in strength and exercise tolerance.    Comorbidities  HTN, HLD, depression, cataract, L knee surgery, L breast lumpectomy, R ankle surgery    Rehab Potential  Good    PT Treatment/Interventions  ADLs/Self Care Home Management;Cryotherapy;Electrical Stimulation;Moist Heat;Balance training;Therapeutic exercise;Therapeutic activities;Functional mobility training;Stair training;Gait training;DME Instruction;Ultrasound;Neuromuscular re-education;Patient/family education;Manual techniques;Vasopneumatic Device;Taping;Energy conservation;Dry needling;Passive range of motion;Scar mobilization    PT Next Visit Plan  gait training without SPC, LE strengthening and stretching    Consulted and Agree with Plan of Care  Patient       Patient will benefit from skilled therapeutic intervention in order to improve the following deficits and impairments:  Increased edema, Decreased scar mobility, Decreased activity tolerance, Decreased strength, Pain, Difficulty walking, Decreased balance, Decreased range of motion, Improper body mechanics, Postural dysfunction, Impaired flexibility  Visit Diagnosis: Acute pain of left knee  Stiffness of left knee, not elsewhere classified  Difficulty in walking, not elsewhere classified  Localized edema     Problem List Patient Active Problem List   Diagnosis Date  Noted  . Primary localized osteoarthritis of left knee 08/07/2019  . Primary osteoarthritis of left knee 08/07/2019  . Primary localized osteoarthritis of right knee 11/21/2018  . Primary osteoarthritis of right knee 11/21/2018  . Personal history of colonic adenoma 08/08/2013  . Family history of colon cancer - elderly mother 08/08/2013     Janene Harvey, PT, DPT 08/30/19 11:58 AM   [facility] [address]  [place], [place], [address] Phone: [phone]   Fax:  [phone]  Name: Stacy Duncan MRN: [id number] Date of Birth: 10-Nov-1950

## 2019-09-03 ENCOUNTER — Other Ambulatory Visit: Payer: Self-pay

## 2019-09-03 ENCOUNTER — Ambulatory Visit: Payer: Medicare Other | Admitting: Physical Therapy

## 2019-09-03 ENCOUNTER — Encounter: Payer: Self-pay | Admitting: Physical Therapy

## 2019-09-03 DIAGNOSIS — R600 Localized edema: Secondary | ICD-10-CM

## 2019-09-03 DIAGNOSIS — M25562 Pain in left knee: Secondary | ICD-10-CM | POA: Diagnosis not present

## 2019-09-03 DIAGNOSIS — M25662 Stiffness of left knee, not elsewhere classified: Secondary | ICD-10-CM

## 2019-09-03 DIAGNOSIS — R262 Difficulty in walking, not elsewhere classified: Secondary | ICD-10-CM

## 2019-09-03 NOTE — Therapy (Signed)
[facility] [address]  [place], [place], [address] Phone: [phone]   Fax:  [phone]  Physical Therapy Treatment  Patient Details  Name: Stacy Duncan MRN: [id number] Date of Birth: Dec 11, 1950 Referring Provider (PT): Melrose Nakayama, MD   Encounter Date: 09/03/2019  PT End of Session - 09/03/19 1146    Visit Number  5    Number of Visits  13    Date for PT Re-Evaluation  10/02/19    Authorization Type  Medicare & Federal BCBS    PT Start Time  1100    PT Stop Time  1155    PT Time Calculation (min)  55 min    Equipment Utilized During Treatment  Gait belt    Activity Tolerance  Patient tolerated treatment well    Behavior During Therapy  [facility] for tasks assessed/performed       Past Medical History:  Diagnosis Date  . Cataract   . Depression   . Heart murmur   . Hypercholesteremia   . Hypertension     Past Surgical History:  Procedure Laterality Date  . ANKLE SURGERY Right   . BREAST LUMPECTOMY Left    mid 29s  . COLONOSCOPY  07/31/2013  . KNEE SURGERY Left   . POLYPECTOMY    . TOTAL KNEE ARTHROPLASTY Right 11/21/2018   Procedure: TOTAL KNEE ARTHROPLASTY;  Surgeon: Melrose Nakayama, MD;  Location: [facility];  Service: Orthopedics;  Laterality: Right;  . TOTAL KNEE ARTHROPLASTY Left 08/07/2019   Procedure: LEFT TOTAL KNEE ARTHROPLASTY;  Surgeon: Melrose Nakayama, MD;  [facility];  Service: Orthopedics;  Laterality: Left;    There were no vitals filed for this visit.  Subjective Assessment - 09/03/19 1104    Subjective  Had a good weekend. Skin over her incision feels tight and tingly.    Pertinent History  HTN, HLD, depression, cataract, L knee surgery, L breast lumpectomy, R ankle surgery    Diagnostic tests  none recent    Patient Stated Goals  get back to walking    Currently in Pain?  Yes    Pain Score  6     Pain Location  Knee    Pain Orientation  Left    Pain Descriptors /  Indicators  --   "weak"   Pain Type  Acute pain;Surgical pain                       [facility] Adult PT Treatment/Exercise - 09/03/19 0001      Knee/Hip Exercises: Stretches   Passive Hamstring Stretch  Left;2 reps;30 seconds   cues for form   Passive Hamstring Stretch Limitations  supine with strap      Knee/Hip Exercises: Aerobic   Recumbent Bike  Lvl 1, 6 min - full revolutions       Knee/Hip Exercises: Standing   Heel Raises  Both;1 set;15 reps    Heel Raises Limitations  at counter top    Lateral Step Up  Left;1 set;10 reps;Hand Hold: 2;Step Height: 6";Hand Hold: 1    Lateral Step Up Limitations  step up on L LE with 2/1 ski poles     Forward Step Up  Left;Step Height: 6";Hand Hold: 2;1 set;10 reps    Forward Step Up Limitations  step up/step through with 2 ski poles and CGA   pain when descending but improved control   Functional Squat  10 reps;2 sets    Functional  Squat Limitations  mini squat at TRX   cues for slight wt shift forward; 2nd set red TB above knees   Other Standing Knee Exercises  L knee flexion stretch at counter top 10x5" to tolerance      Knee/Hip Exercises: Supine   Bridges with Diona Foley Squeeze  Strengthening;Both;1 set;10 reps    Bridges with Clamshell  Strengthening;Both;1 set;10 reps   red TB above knees   Other Supine Knee/Hip Exercises  supine resisted alt marching with red loop around toes x20      Vasopneumatic   Number Minutes Vasopneumatic   10 minutes    Vasopnuematic Location   Knee   L   Vasopneumatic Pressure  Medium    Vasopneumatic Temperature   coldest temp.                 PT Short Term Goals - 08/30/19 1151      PT SHORT TERM GOAL #1   Title  Patient to be independent with initial HEP.    Time  3    Period  Weeks    Status  Achieved    Target Date  09/11/19        PT Long Term Goals - 08/23/19 1129      PT LONG TERM GOAL #1   Title  Patient to be independent with advanced HEP.    Time  6    Period   Weeks    Status  On-going      PT LONG TERM GOAL #2   Title  Patient to demonstrate L knee AROM 0-120 degrees.    Time  6    Period  Weeks    Status  On-going      PT LONG TERM GOAL #3   Title  Patient to demonstrate B LE strength >=4+/5.     Time  6    Period  Weeks    Status  On-going      PT LONG TERM GOAL #4   Title  Patient to demonstrate symmetrical strep length, weight shift, and knee flexion with LRAD.    Time  6    Period  Weeks    Status  On-going      PT LONG TERM GOAL #5   Title  Patient to report tolerance of walking 1/2 mile without pain limiting.    Time  6    Period  Weeks    Status  On-going            Plan - 09/03/19 1147    Clinical Impression Statement  Patient arrived to session with no new complaints. L knee demonstrating normal amount of redness and edema at beginning of session. Worked on standing progressive LE strengthening ther-ex with patient demonstrating good tolerance. Reported apprehension with anterior and lateral step ups but with improvement in control and able to slightly decrease UE assistance. Did require infrequent rest breaks during standing ther-ex d/t fatigue. Worked on gentle LE stretching and core stability exercises for improvement in proximal hip strength. Ended session with Gameready to L knee for pain and edema relief. No complaints at end of session.    Comorbidities  HTN, HLD, depression, cataract, L knee surgery, L breast lumpectomy, R ankle surgery    Rehab Potential  Good    PT Treatment/Interventions  ADLs/Self Care Home Management;Cryotherapy;Electrical Stimulation;Moist Heat;Balance training;Therapeutic exercise;Therapeutic activities;Functional mobility training;Stair training;Gait training;DME Instruction;Ultrasound;Neuromuscular re-education;Patient/family education;Manual techniques;Vasopneumatic Device;Taping;Energy conservation;Dry needling;Passive range of motion;Scar mobilization    PT Next Visit  Plan  gait  training without SPC, LE strengthening and stretching    Consulted and Agree with Plan of Care  Patient       Patient will benefit from skilled therapeutic intervention in order to improve the following deficits and impairments:  Increased edema, Decreased scar mobility, Decreased activity tolerance, Decreased strength, Pain, Difficulty walking, Decreased balance, Decreased range of motion, Improper body mechanics, Postural dysfunction, Impaired flexibility  Visit Diagnosis: Acute pain of left knee  Stiffness of left knee, not elsewhere classified  Difficulty in walking, not elsewhere classified  Localized edema     Problem List Patient Active Problem List   Diagnosis Date Noted  . Primary localized osteoarthritis of left knee 08/07/2019  . Primary osteoarthritis of left knee 08/07/2019  . Primary localized osteoarthritis of right knee 11/21/2018  . Primary osteoarthritis of right knee 11/21/2018  . Personal history of colonic adenoma 08/08/2013  . Family history of colon cancer - elderly mother 08/08/2013      Janene Harvey, PT, DPT 09/03/19 12:08 PM   [facility] [address]  [place], [place], [address] Phone: [phone]   Fax:  [phone]  Name: Stacy Duncan MRN: [id number] Date of Birth: 02-04-1951

## 2019-09-06 ENCOUNTER — Other Ambulatory Visit: Payer: Self-pay

## 2019-09-06 ENCOUNTER — Ambulatory Visit: Payer: Medicare Other

## 2019-09-06 DIAGNOSIS — M25562 Pain in left knee: Secondary | ICD-10-CM | POA: Diagnosis not present

## 2019-09-06 DIAGNOSIS — R600 Localized edema: Secondary | ICD-10-CM

## 2019-09-06 DIAGNOSIS — R262 Difficulty in walking, not elsewhere classified: Secondary | ICD-10-CM

## 2019-09-06 DIAGNOSIS — M25662 Stiffness of left knee, not elsewhere classified: Secondary | ICD-10-CM

## 2019-09-06 NOTE — Therapy (Addendum)
[facility] [address]  [place], [place], [address] Phone: [phone]   Fax:  [phone]  Physical Therapy Treatment  Patient Details  Name: Stacy Duncan MRN: [id number] Date of Birth: Sep 09, 1951 Referring Provider (PT): Melrose Nakayama, MD   Encounter Date: 09/06/2019  PT End of Session - 09/06/19 1136    Visit Number  6    Number of Visits  13    Date for PT Re-Evaluation  10/02/19    Authorization Type  Medicare & Federal BCBS    PT Start Time  1058    PT Stop Time  1148    PT Time Calculation (min)  50 min    Equipment Utilized During Treatment  Gait belt    Activity Tolerance  Patient tolerated treatment well    Behavior During Therapy  WFL for tasks assessed/performed       Past Medical History:  Diagnosis Date  . Cataract   . Depression   . Heart murmur   . Hypercholesteremia   . Hypertension     Past Surgical History:  Procedure Laterality Date  . ANKLE SURGERY Right   . BREAST LUMPECTOMY Left    mid 40s  . COLONOSCOPY  07/31/2013  . KNEE SURGERY Left   . POLYPECTOMY    . TOTAL KNEE ARTHROPLASTY Right 11/21/2018   Procedure: TOTAL KNEE ARTHROPLASTY;  Surgeon: Melrose Nakayama, MD;  Location: [facility];  Service: Orthopedics;  Laterality: Right;  . TOTAL KNEE ARTHROPLASTY Left 08/07/2019   Procedure: LEFT TOTAL KNEE ARTHROPLASTY;  Surgeon: Melrose Nakayama, MD;  Location: [facility];  Service: Orthopedics;  Laterality: Left;    There were no vitals filed for this visit.  Subjective Assessment - 09/06/19 1102    Subjective  Annet able to squat down and pick up 30lb dog to put him in tub yesterday without issue.    Pertinent History  HTN, HLD, depression, cataract, L knee surgery, L breast lumpectomy, R ankle surgery    Diagnostic tests  none recent    Patient Stated Goals  get back to walking    Currently in Pain?  Yes    Pain Score  3     Pain Location  Knee    Pain Orientation  Left    Pain  Descriptors / Indicators  --   "stiff"   Pain Type  Acute pain;Surgical pain    Multiple Pain Sites  No         [facility] PT Assessment - 09/06/19 0001      Assessment   Medical Diagnosis  s/p L TKA    Referring Provider (PT)  Melrose Nakayama, MD    Onset Date/Surgical Date  08/07/19    Next MD Visit  09/12/19    Prior Therapy  yes      AROM   AROM Assessment Site  Knee    Right/Left Knee  Left    Left Knee Extension  1    Left Knee Flexion  112      PROM   PROM Assessment Site  Knee    Right/Left Knee  Left    Left Knee Extension  1    Left Knee Flexion  115                   [facility] Adult PT Treatment/Exercise - 09/06/19 0001      Ambulation/Gait   Ambulation/Gait  Yes    Ambulation/Gait Assistance  5: Supervision  Ambulation Distance (Feet)  90 Feet    Assistive device  None    Gait Pattern  Step-through pattern;Step-to pattern;Decreased stance time - left;Decreased step length - right;Decreased hip/knee flexion - left    Ambulation Surface  Level;Indoor    Stairs  Yes    Stairs Assistance  5: Supervision    Stairs Assistance Details (indicate cue type and reason)  Cues provided for step-over-step with pt. able to perform reciprocal pattern ascedning/descending with 1 hand rail and required increased time     Stair Management Technique  One rail Right;Alternating pattern    Number of Stairs  13    Height of Stairs  7    Gait Comments  Pt. with improved B stance time, heel strike, upright posture       Knee/Hip Exercises: Stretches   Hip Flexor Stretch  Left;2 reps;30 seconds    Hip Flexor Stretch Limitations  mod thomas with strap      Knee/Hip Exercises: Aerobic   Recumbent Bike  Lvl 1, 6 min - full revolutions       Knee/Hip Exercises: Machines for Strengthening   Cybex Knee Extension  B LEs: 5# x 15 reps     Cybex Knee Flexion  B LEs: 20# x 15 reps     Cybex Leg Press  B LEs: 25# x 15 rpes       Knee/Hip Exercises: Supine   Straight Leg Raises   Left;Strengthening   x 12 reps    Straight Leg Raises Limitations  1#; no quad lag      Vasopneumatic   Number Minutes Vasopneumatic   10 minutes    Vasopnuematic Location   Knee    Vasopneumatic Pressure  Medium    Vasopneumatic Temperature   coldest temp.        Manual Therapy   Joint Mobilization  L patellar mobs grade III in all directions - good mobility however increased swelling present              PT Education - 09/06/19 1154    Education Details  HEP update; 3  dir hip kicker, counter squat    Person(s) Educated  Patient    Methods  Explanation;Verbal cues;Handout    Comprehension  Verbalized understanding;Verbal cues required       PT Short Term Goals - 08/30/19 1151      PT SHORT TERM GOAL #1   Title  Patient to be independent with initial HEP.    Time  3    Period  Weeks    Status  Achieved    Target Date  09/11/19        PT Long Term Goals - 08/23/19 1129      PT LONG TERM GOAL #1   Title  Patient to be independent with advanced HEP.    Time  6    Period  Weeks    Status  On-going      PT LONG TERM GOAL #2   Title  Patient to demonstrate L knee AROM 0-120 degrees.    Time  6    Period  Weeks    Status  On-going      PT LONG TERM GOAL #3   Title  Patient to demonstrate B LE strength >=4+/5.     Time  6    Period  Weeks    Status  On-going      PT LONG TERM GOAL #4   Title  Patient to demonstrate symmetrical  strep length, weight shift, and knee flexion with LRAD.    Time  6    Period  Weeks    Status  On-going      PT LONG TERM GOAL #5   Title  Patient to report tolerance of walking 1/2 mile without pain limiting.    Time  6    Period  Weeks    Status  On-going            Plan - 09/06/19 1154    Clinical Impression Statement  Stacy Duncan progressing well with physical therapy.  Able to demo nearly symmetrical gait pattern without AD with improved heel strike and L weight shift today.  Navigating stairs with alternating pattern  ascending and descending now only requiring light UE rail use and increased time.  Demonstrating much improved L knee AROM/PROM (see flowsheet).  Focused session on LE machine strengthening which was tolerated well and updated HEP with 3-dir hip kicker and counter squat as pt. already performing these occasionally at home  Ended visit with ice/compression to L knee to reduce post-exercise soreness and swelling.    Comorbidities  HTN, HLD, depression, cataract, L knee surgery, L breast lumpectomy, R ankle surgery    Rehab Potential  Good    PT Treatment/Interventions  ADLs/Self Care Home Management;Cryotherapy;Electrical Stimulation;Moist Heat;Balance training;Therapeutic exercise;Therapeutic activities;Functional mobility training;Stair training;Gait training;DME Instruction;Ultrasound;Neuromuscular re-education;Patient/family education;Manual techniques;Vasopneumatic Device;Taping;Energy conservation;Dry needling;Passive range of motion;Scar mobilization    PT Next Visit Plan  gait training without SPC, LE strengthening and stretching    Consulted and Agree with Plan of Care  Patient       Patient will benefit from skilled therapeutic intervention in order to improve the following deficits and impairments:  Increased edema, Decreased scar mobility, Decreased activity tolerance, Decreased strength, Pain, Difficulty walking, Decreased balance, Decreased range of motion, Improper body mechanics, Postural dysfunction, Impaired flexibility  Visit Diagnosis: Acute pain of left knee  Stiffness of left knee, not elsewhere classified  Difficulty in walking, not elsewhere classified  Localized edema     Problem List Patient Active Problem List   Diagnosis Date Noted  . Primary localized osteoarthritis of left knee 08/07/2019  . Primary osteoarthritis of left knee 08/07/2019  . Primary localized osteoarthritis of right knee 11/21/2018  . Primary osteoarthritis of right knee 11/21/2018  . Personal  history of colonic adenoma 08/08/2013  . Family history of colon cancer - elderly mother 08/08/2013    Bess Harvest, PTA 09/06/19 12:19 PM   [facility] [address]  [place], [place], [address] Phone: [phone]   Fax:  [phone]  Name: Stacy Duncan MRN: [id number] Date of Birth: [birth year]

## 2019-09-10 ENCOUNTER — Encounter: Payer: Self-pay | Admitting: Physical Therapy

## 2019-09-10 ENCOUNTER — Other Ambulatory Visit: Payer: Self-pay

## 2019-09-10 ENCOUNTER — Ambulatory Visit: Payer: Medicare Other | Admitting: Physical Therapy

## 2019-09-10 DIAGNOSIS — R600 Localized edema: Secondary | ICD-10-CM

## 2019-09-10 DIAGNOSIS — R262 Difficulty in walking, not elsewhere classified: Secondary | ICD-10-CM

## 2019-09-10 DIAGNOSIS — M6281 Muscle weakness (generalized): Secondary | ICD-10-CM

## 2019-09-10 DIAGNOSIS — M25562 Pain in left knee: Secondary | ICD-10-CM

## 2019-09-10 DIAGNOSIS — M25662 Stiffness of left knee, not elsewhere classified: Secondary | ICD-10-CM

## 2019-09-10 NOTE — Therapy (Signed)
[facility] [address]  [place], [place], [address] Phone: [phone]   Fax:  [phone]  Physical Therapy Treatment  Patient Details  Name: Stacy Duncan MRN: [id number] Date of Birth: December 22, 1950 Referring Provider (PT): Melrose Nakayama, MD   Encounter Date: 09/10/2019  PT End of Session - 09/10/19 1327    Visit Number  7    Number of Visits  13    Date for PT Re-Evaluation  10/02/19    Authorization Type  Medicare & Federal BCBS    PT Start Time  1315    PT Stop Time  1410    PT Time Calculation (min)  55 min    Activity Tolerance  Patient tolerated treatment well    Behavior During Therapy  [facility] for tasks assessed/performed       Past Medical History:  Diagnosis Date  . Cataract   . Depression   . Heart murmur   . Hypercholesteremia   . Hypertension     Past Surgical History:  Procedure Laterality Date  . ANKLE SURGERY Right   . BREAST LUMPECTOMY Left    mid 38s  . COLONOSCOPY  07/31/2013  . KNEE SURGERY Left   . POLYPECTOMY    . TOTAL KNEE ARTHROPLASTY Right 11/21/2018   Procedure: TOTAL KNEE ARTHROPLASTY;  Surgeon: Melrose Nakayama, MD;  Location: [facility];  Service: Orthopedics;  Laterality: Right;  . TOTAL KNEE ARTHROPLASTY Left 08/07/2019   Procedure: LEFT TOTAL KNEE ARTHROPLASTY;  Surgeon: Melrose Nakayama, MD;  Location: [facility];  Service: Orthopedics;  Laterality: Left;    There were no vitals filed for this visit.  Subjective Assessment - 09/10/19 1323    Subjective  [name] reporting 4/10 pain in Left knee today.    Pertinent History  HTN, HLD, depression, cataract, L knee surgery, L breast lumpectomy, R ankle surgery    Limitations  Sitting;Lifting;Standing;Walking;House hold activities    How long can you sit comfortably?  45 min    How long can you stand comfortably?  20 min    How long can you walk comfortably?  10 min    Diagnostic tests  none recent    Patient Stated Goals  get back  to walking    Currently in Pain?  Yes    Pain Score  4     Pain Location  Knee    Pain Orientation  Left    Pain Descriptors / Indicators  Aching    Pain Type  Acute pain;Surgical pain    Pain Onset  More than a month ago    Pain Frequency  Intermittent         [facility] PT Assessment - 09/10/19 0001      Assessment   Medical Diagnosis  s/p L TKA    Referring Provider (PT)  Melrose Nakayama, MD    Onset Date/Surgical Date  08/07/19    Next MD Visit  09/12/19    Prior Therapy  yes      AROM   AROM Assessment Site  Knee    Right/Left Knee  Left    Left Knee Extension  2    Left Knee Flexion  114      PROM   PROM Assessment Site  Knee    Right/Left Knee  Left    Left Knee Extension  1    Left Knee Flexion  118  [facility] Adult PT Treatment/Exercise - 09/10/19 0001      Knee/Hip Exercises: Stretches   Hip Flexor Stretch  Left;2 reps;30 seconds    Hip Flexor Stretch Limitations  mod thomas with strap      Knee/Hip Exercises: Aerobic   Recumbent Bike  Lvl 2, 6 min - full revolutions       Knee/Hip Exercises: Machines for Strengthening   Cybex Knee Extension  B LEs: 5# x 15 reps, 10# x 2 sets of 5    Cybex Knee Flexion  B LEs: 20# x 15 reps     Cybex Leg Press  B LEs: 25# x 15 rpes       Knee/Hip Exercises: Standing   Heel Raises  Both;15 reps    Heel Raises Limitations  using UE support    Other Standing Knee Exercises  Standing 4 way hip exercises all x 15 reps with 2# ankle weight      Knee/Hip Exercises: Supine   Straight Leg Raises  Left;Strengthening   x 12 reps    Straight Leg Raises Limitations  1#; no quad lag      Vasopneumatic   Number Minutes Vasopneumatic   10 minutes    Vasopnuematic Location   Knee    Vasopneumatic Pressure  Medium    Vasopneumatic Temperature   34 degees      Manual Therapy   Joint Mobilization  L patellar mobs grade III in all directions - good mobility however increased swelling present                 PT Short Term Goals - 08/30/19 1151      PT SHORT TERM GOAL #1   Title  Patient to be independent with initial HEP.    Time  3    Period  Weeks    Status  Achieved    Target Date  09/11/19        PT Long Term Goals - 09/10/19 1333      PT LONG TERM GOAL #1   Title  Patient to be independent with advanced HEP.    Time  6    Period  Weeks    Status  On-going      PT LONG TERM GOAL #2   Title  Patient to demonstrate L knee AROM 0-120 degrees.    Time  6    Period  Weeks      PT LONG TERM GOAL #3   Title  Patient to demonstrate B LE strength >=4+/5.     Time  6    Period  Weeks      PT LONG TERM GOAL #4   Title  Patient to demonstrate symmetrical strep length, weight shift, and knee flexion with LRAD.    Time  6    Period  Weeks    Status  On-going      PT LONG TERM GOAL #5   Title  Patient to report tolerance of walking 1/2 mile without pain limiting.    Time  6    Period  Weeks    Status  On-going            Plan - 09/10/19 1329    Clinical Impression Statement  Pt tolreating all exercises well. Pt able to demonstrate correct techniques. Pt amb with no assisitve device, still progressing with heel to toe gait pattern. Continue to progress LE strengthening and stair navigation. Pt with normal response to vasopneumatic at end of session  for pain and swelling. Continued skilled PT to progress toward pt's PLOF.    Personal Factors and Comorbidities  Age;Comorbidity 3+;Time since onset of injury/illness/exacerbation;Fitness;Past/Current Experience    Comorbidities  HTN, HLD, depression, cataract, L knee surgery, L breast lumpectomy, R ankle surgery    Examination-Activity Limitations  Sit;Sleep;Bend;Squat;Stairs;Stand;Carry;Toileting;Transfers;Dressing;Hygiene/Grooming;Lift;Locomotion Level    Examination-Participation Restrictions  Church;Cleaning;Shop;Community Activity;Driving;Yard Work;Interpersonal Relationship;Laundry;Meal Prep     Stability/Clinical Decision Making  Stable/Uncomplicated    Rehab Potential  Good    PT Frequency  2x / week    PT Treatment/Interventions  ADLs/Self Care Home Management;Cryotherapy;Electrical Stimulation;Moist Heat;Balance training;Therapeutic exercise;Therapeutic activities;Functional mobility training;Stair training;Gait training;DME Instruction;Ultrasound;Neuromuscular re-education;Patient/family education;Manual techniques;Vasopneumatic Device;Taping;Energy conservation;Dry needling;Passive range of motion;Scar mobilization    PT Next Visit Plan  gait training without SPC, LE strengthening and stretching    Consulted and Agree with Plan of Care  Patient       Patient will benefit from skilled therapeutic intervention in order to improve the following deficits and impairments:  Increased edema, Decreased scar mobility, Decreased activity tolerance, Decreased strength, Pain, Difficulty walking, Decreased balance, Decreased range of motion, Improper body mechanics, Postural dysfunction, Impaired flexibility  Visit Diagnosis: Stiffness of left knee, not elsewhere classified  Acute pain of left knee  Difficulty in walking, not elsewhere classified  Localized edema  Muscle weakness (generalized)     Problem List Patient Active Problem List   Diagnosis Date Noted  . Primary localized osteoarthritis of left knee 08/07/2019  . Primary osteoarthritis of left knee 08/07/2019  . Primary localized osteoarthritis of right knee 11/21/2018  . Primary osteoarthritis of right knee 11/21/2018  . Personal history of colonic adenoma 08/08/2013  . Family history of colon cancer - elderly mother 08/08/2013    Oretha Caprice, PT 09/10/2019, 2:01 PM  [facility] [address]  [place], [place], [address] Phone: [phone]   Fax:  [phone]  Name: Stacy Duncan MRN: [id number] Date of Birth: 28-Aug-1951

## 2019-09-11 ENCOUNTER — Ambulatory Visit: Payer: Medicare Other | Admitting: Physical Therapy

## 2019-09-13 ENCOUNTER — Ambulatory Visit: Payer: Medicare Other

## 2019-09-13 ENCOUNTER — Other Ambulatory Visit: Payer: Self-pay

## 2019-09-13 DIAGNOSIS — R600 Localized edema: Secondary | ICD-10-CM

## 2019-09-13 DIAGNOSIS — M25562 Pain in left knee: Secondary | ICD-10-CM

## 2019-09-13 DIAGNOSIS — M25662 Stiffness of left knee, not elsewhere classified: Secondary | ICD-10-CM

## 2019-09-13 DIAGNOSIS — R262 Difficulty in walking, not elsewhere classified: Secondary | ICD-10-CM

## 2019-09-13 NOTE — Therapy (Signed)
[facility] [address]  [place], [place], [address] Phone: [phone]   Fax:  [phone]  Physical Therapy Treatment  Patient Details  Name: Stacy Duncan MRN: [id number] Date of Birth: May 28, 1951 Referring Provider (PT): Melrose Nakayama, MD   Encounter Date: 09/13/2019  PT End of Session - 09/13/19 1126    Visit Number  8    Number of Visits  13    Date for PT Re-Evaluation  10/02/19    Authorization Type  Medicare & Federal BCBS    PT Start Time  1102    PT Stop Time  1145    PT Time Calculation (min)  43 min    Activity Tolerance  Patient tolerated treatment well    Behavior During Therapy  [facility] for tasks assessed/performed       Past Medical History:  Diagnosis Date  . Cataract   . Depression   . Heart murmur   . Hypercholesteremia   . Hypertension     Past Surgical History:  Procedure Laterality Date  . ANKLE SURGERY Right   . BREAST LUMPECTOMY Left    mid 53s  . COLONOSCOPY  07/31/2013  . KNEE SURGERY Left   . POLYPECTOMY    . TOTAL KNEE ARTHROPLASTY Right 11/21/2018   Procedure: TOTAL KNEE ARTHROPLASTY;  Surgeon: Melrose Nakayama, MD;  Location: [facility];  Service: Orthopedics;  Laterality: Right;  . TOTAL KNEE ARTHROPLASTY Left 08/07/2019   Procedure: LEFT TOTAL KNEE ARTHROPLASTY;  Surgeon: Melrose Nakayama, MD;  Location: [facility];  Service: Orthopedics;  Laterality: Left;    There were no vitals filed for this visit.  Subjective Assessment - 09/13/19 1130    Subjective  Pt. reporting she saw MD yesterday with MD pleased with her ROM progresss.    Pertinent History  HTN, HLD, depression, cataract, L knee surgery, L breast lumpectomy, R ankle surgery    Diagnostic tests  none recent    Patient Stated Goals  get back to walking    Currently in Pain?  Yes    Pain Score  2     Pain Location  Knee    Pain Orientation  Left    Pain Descriptors / Indicators  Aching    Pain Type  Acute  pain;Surgical pain    Pain Onset  More than a month ago    Multiple Pain Sites  No                       [facility] Adult PT Treatment/Exercise - 09/13/19 0001      Knee/Hip Exercises: Aerobic   Recumbent Bike  Lvl 1, 6 min - full revolutions       Knee/Hip Exercises: Standing   Hip Flexion  10 reps;Knee straight;Stengthening;Right;Left;1 set    Hip Flexion Limitations  yellow TB at ankles; 1 ski pole    Hip Abduction  10 reps;Knee straight;Right;Left;Stengthening    Abduction Limitations  yellow TB at ankle; 1 ski pole    Hip Extension  10 reps;Knee straight;Right;Left    Extension Limitations  yellow TB at ankle; 1 ski pole    Lateral Step Up  Right;Left;10 reps;Hand Hold: 2    Lateral Step Up Limitations  7" step in stairwell     Forward Step Up  Right;Left;10 reps;Hand Hold: 1    Forward Step Up Limitations  7" step in stairwell     SLS  R/L SLS 3 x 10 sec each;  1 ski pole      Knee/Hip Exercises: Supine   Bridges  Both;15 reps      Knee/Hip Exercises: Sidelying   Hip ABduction  Left;10 reps;Strengthening    Hip ADduction  Left;10 reps;Strengthening             PT Education - 09/13/19 1153    Education Details  HEP update; 3-way SLR standing with yellow looped TB issue to pt., SLS balance    Person(s) Educated  Patient    Methods  Explanation;Demonstration;Verbal cues;Handout    Comprehension  Verbalized understanding;Returned demonstration;Verbal cues required       PT Short Term Goals - 08/30/19 1151      PT SHORT TERM GOAL #1   Title  Patient to be independent with initial HEP.    Time  3    Period  Weeks    Status  Achieved    Target Date  09/11/19        PT Long Term Goals - 09/13/19 1144      PT LONG TERM GOAL #1   Title  Patient to be independent with advanced HEP.    Time  6    Period  Weeks    Status  On-going      PT LONG TERM GOAL #2   Title  Patient to demonstrate L knee AROM 0-120 degrees.    Time  6    Period  Weeks     Status  On-going      PT LONG TERM GOAL #3   Title  Patient to demonstrate B LE strength >=4+/5.     Time  6    Period  Weeks    Status  On-going      PT LONG TERM GOAL #4   Title  Patient to demonstrate symmetrical strep length, weight shift, and knee flexion with LRAD.    Time  6    Period  Weeks    Status  On-going      PT LONG TERM GOAL #5   Title  Patient to report tolerance of walking 1/2 mile without pain limiting.    Time  6    Period  Weeks    Status  On-going            Plan - 09/13/19 1127    Clinical Impression Statement  Doing well with no new complaints.  Tolerated progression of standing 3-way hip kicker to yellow TB resistance well today thus updated HEP with this and SLS balance as pt. able to safety perform.  Notes MD pleased with her ROM progress at his recent f/u.  Deferred ice/compression to end session as pt. planning to ice at home.    Comorbidities  HTN, HLD, depression, cataract, L knee surgery, L breast lumpectomy, R ankle surgery    Rehab Potential  Good    PT Treatment/Interventions  ADLs/Self Care Home Management;Cryotherapy;Electrical Stimulation;Moist Heat;Balance training;Therapeutic exercise;Therapeutic activities;Functional mobility training;Stair training;Gait training;DME Instruction;Ultrasound;Neuromuscular re-education;Patient/family education;Manual techniques;Vasopneumatic Device;Taping;Energy conservation;Dry needling;Passive range of motion;Scar mobilization    PT Next Visit Plan  LE strengthening and stretching, SLS activities, wall squats, 4" step-downs    Consulted and Agree with Plan of Care  Patient       Patient will benefit from skilled therapeutic intervention in order to improve the following deficits and impairments:  Increased edema, Decreased scar mobility, Decreased activity tolerance, Decreased strength, Pain, Difficulty walking, Decreased balance, Decreased range of motion, Improper body mechanics, Postural dysfunction,  Impaired flexibility  Visit  Diagnosis: Stiffness of left knee, not elsewhere classified  Acute pain of left knee  Difficulty in walking, not elsewhere classified  Localized edema     Problem List Patient Active Problem List   Diagnosis Date Noted  . Primary localized osteoarthritis of left knee 08/07/2019  . Primary osteoarthritis of left knee 08/07/2019  . Primary localized osteoarthritis of right knee 11/21/2018  . Primary osteoarthritis of right knee 11/21/2018  . Personal history of colonic adenoma 08/08/2013  . Family history of colon cancer - elderly mother 08/08/2013    Bess Harvest, PTA 09/13/19 12:05 PM   [facility] [address]  [place], [place], [address] Phone: [phone]   Fax:  [phone]  Name: Christien Schroth MRN: [id number] Date of Birth: 05/12/1951

## 2019-09-17 ENCOUNTER — Ambulatory Visit: Payer: Medicare Other | Admitting: Physical Therapy

## 2019-09-17 ENCOUNTER — Encounter: Payer: Self-pay | Admitting: Physical Therapy

## 2019-09-17 ENCOUNTER — Other Ambulatory Visit: Payer: Self-pay

## 2019-09-17 DIAGNOSIS — M25562 Pain in left knee: Secondary | ICD-10-CM

## 2019-09-17 DIAGNOSIS — R262 Difficulty in walking, not elsewhere classified: Secondary | ICD-10-CM

## 2019-09-17 DIAGNOSIS — R600 Localized edema: Secondary | ICD-10-CM

## 2019-09-17 DIAGNOSIS — M25662 Stiffness of left knee, not elsewhere classified: Secondary | ICD-10-CM

## 2019-09-17 NOTE — Therapy (Signed)
[facility] [address]  [place], [place], [address] Phone: [phone]   Fax:  [phone]  Physical Therapy Progress Note/Discharge Summary  Patient Details  Name: Stacy Duncan MRN: [id number] Date of Birth: [birth year] Referring Provider (PT): Melrose Nakayama, MD   Progress Note Reporting Period 08/21/19 to 09/17/19  See note below for Objective Data and Assessment of Progress/Goals.     Encounter Date: 09/17/2019  PT End of Session - 09/17/19 1142    Visit Number  9    Number of Visits  13    Date for PT Re-Evaluation  10/02/19    Authorization Type  Medicare & Federal BCBS    PT Start Time  1057    PT Stop Time  1137    PT Time Calculation (min)  40 min    Activity Tolerance  Patient tolerated treatment well    Behavior During Therapy  WFL for tasks assessed/performed       Past Medical History:  Diagnosis Date  . Cataract   . Depression   . Heart murmur   . Hypercholesteremia   . Hypertension     Past Surgical History:  Procedure Laterality Date  . ANKLE SURGERY Right   . BREAST LUMPECTOMY Left    mid 68s  . COLONOSCOPY  07/31/2013  . KNEE SURGERY Left   . POLYPECTOMY    . TOTAL KNEE ARTHROPLASTY Right 11/21/2018   Procedure: TOTAL KNEE ARTHROPLASTY;  Surgeon: Melrose Nakayama, MD;  Location: [facility];  Service: Orthopedics;  Laterality: Right;  . TOTAL KNEE ARTHROPLASTY Left 08/07/2019   Procedure: LEFT TOTAL KNEE ARTHROPLASTY;  Surgeon: Melrose Nakayama, MD;  Location: [facility];  Service: Orthopedics;  Laterality: Left;    There were no vitals filed for this visit.  Subjective Assessment - 09/17/19 1059    Subjective  Thinks that she has been doing good. Feels like her skin is stretching back into place. Reports 85% improvement.    Pertinent History  HTN, HLD, depression, cataract, L knee surgery, L breast lumpectomy, R ankle surgery    Diagnostic tests  none recent    Patient Stated Goals   get back to walking    Currently in Pain?  Yes    Pain Score  4     Pain Location  Knee    Pain Orientation  Left    Pain Descriptors / Indicators  Aching    Pain Type  Acute pain;Surgical pain         [facility] PT Assessment - 09/17/19 0001      Observation/Other Assessments   Focus on Therapeutic Outcomes (FOTO)   Knee: 68 (32% limited, 23% predicted)      AROM   AROM Assessment Site  Knee    Right/Left Knee  Left    Left Knee Extension  0    Left Knee Flexion  118      PROM   PROM Assessment Site  Knee    Right/Left Knee  Left    Left Knee Extension  0    Left Knee Flexion  120      Strength   Right Hip Flexion  4+/5    Right Hip ABduction  4+/5    Right Hip ADduction  4+/5    Left Hip Flexion  4+/5    Left Hip ABduction  4+/5   lateral knee pain   Left Hip ADduction  4+/5    Right Knee Flexion  5/5  Right Knee Extension  5/5    Left Knee Flexion  5/5    Left Knee Extension  5/5    Right Ankle Dorsiflexion  4+/5    Right Ankle Plantar Flexion  4+/5    Left Ankle Dorsiflexion  4+/5    Left Ankle Plantar Flexion  4+/5                   [facility] Adult PT Treatment/Exercise - 09/17/19 0001      Ambulation/Gait   Ambulation/Gait  Yes    Ambulation/Gait Assistance  7: Independent    Ambulation Distance (Feet)  90 Feet    Assistive device  None    Gait Pattern  Step-through pattern;Step-to pattern;Decreased stance time - left;Decreased step length - right    Ambulation Surface  Level;Indoor      Knee/Hip Exercises: Stretches   Passive Hamstring Stretch  Left;2 reps;30 seconds    Passive Hamstring Stretch Limitations  supine with strap    Hip Flexor Stretch  Left;2 reps;30 seconds    Hip Flexor Stretch Limitations  mod thomas with strap      Knee/Hip Exercises: Aerobic   Recumbent Bike  Lvl 1, 6 min - full revolutions              PT Education - 09/17/19 1141    Education Details  update and consolidation of HEP;discussion on incremental  walking progression to tolerance    Person(s) Educated  Patient    Methods  Explanation;Demonstration;Tactile cues;Verbal cues;Handout    Comprehension  Verbalized understanding;Returned demonstration       PT Short Term Goals - 09/17/19 1102      PT SHORT TERM GOAL #1   Title  Patient to be independent with initial HEP.    Time  3    Period  Weeks    Status  Achieved    Target Date  09/11/19        PT Long Term Goals - 09/17/19 1103      PT LONG TERM GOAL #1   Title  Patient to be independent with advanced HEP.    Time  6    Period  Weeks    Status  Achieved      PT LONG TERM GOAL #2   Title  Patient to demonstrate L knee AROM 0-120 degrees.    Time  6    Period  Weeks    Status  Achieved      PT LONG TERM GOAL #3   Title  Patient to demonstrate B LE strength >=4+/5.     Time  6    Period  Weeks    Status  Achieved      PT LONG TERM GOAL #4   Title  Patient to demonstrate symmetrical strep length, weight shift, and knee flexion with LRAD.    Time  6    Period  Weeks    Status  Partially Met   slightly decreased step length remaining on R LE without AD     PT LONG TERM GOAL #5   Title  Patient to report tolerance of walking 1/2 mile without pain limiting.    Time  6    Period  Weeks    Status  Partially Met   able to walk 1/4 mile without pain- has not tried 1/2 mile yet           Plan - 09/17/19 1147    Clinical Impression Statement  Patient reporting 85%  improvement in L knee since initial eval. Notes that she feels that she is no longer functionally limited, but rather having some pain remaining. Patient has now met ROM goal- with L knee PROM measuring 0-120 degrees. Strength goal has also been met, with all muscle groups at least 4+/5. Patient's gait pattern is near normal without AD, however with slight decreased in R step length remaining. Patient notes that she is able to walk 1/4 mile without pain but has not tried her goal of a 1/2 mile yet.  Patient notes that she feels comfortable with wrapping up with PT today. Updated and consolidated HEP for max benefit- patient reported understanding. Patient has met or partially met all goals at this time, noting no remaining functional limitations. Patient is ready for D/C.    Comorbidities  HTN, HLD, depression, cataract, L knee surgery, L breast lumpectomy, R ankle surgery    Rehab Potential  Good    PT Treatment/Interventions  ADLs/Self Care Home Management;Cryotherapy;Electrical Stimulation;Moist Heat;Balance training;Therapeutic exercise;Therapeutic activities;Functional mobility training;Stair training;Gait training;DME Instruction;Ultrasound;Neuromuscular re-education;Patient/family education;Manual techniques;Vasopneumatic Device;Taping;Energy conservation;Dry needling;Passive range of motion;Scar mobilization    PT Next Visit Plan  DC at this time    Consulted and Agree with Plan of Care  Patient       Patient will benefit from skilled therapeutic intervention in order to improve the following deficits and impairments:  Increased edema, Decreased scar mobility, Decreased activity tolerance, Decreased strength, Pain, Difficulty walking, Decreased balance, Decreased range of motion, Improper body mechanics, Postural dysfunction, Impaired flexibility  Visit Diagnosis: Stiffness of left knee, not elsewhere classified  Acute pain of left knee  Difficulty in walking, not elsewhere classified  Localized edema     Problem List Patient Active Problem List   Diagnosis Date Noted  . Primary localized osteoarthritis of left knee 08/07/2019  . Primary osteoarthritis of left knee 08/07/2019  . Primary localized osteoarthritis of right knee 11/21/2018  . Primary osteoarthritis of right knee 11/21/2018  . Personal history of colonic adenoma 08/08/2013  . Family history of colon cancer - elderly mother 08/08/2013      PHYSICAL THERAPY DISCHARGE SUMMARY  Visits from Start of Care:  9  Current functional level related to goals / functional outcomes: See above clinical impression   Remaining deficits: Gait deviations, decreased walking tolerance   Education / Equipment: HEP  Plan: Patient agrees to discharge.  Patient goals were partially met. Patient is being discharged due to meeting the stated rehab goals.  ?????     Janene Harvey, PT, DPT 09/17/19 11:54 AM   [facility] [address]  [place], [place], [address] Phone: [phone]   Fax:  [phone]  Name: Stacy Duncan MRN: [id number] Date of Birth: 11/30/1950

## 2019-09-19 ENCOUNTER — Ambulatory Visit: Payer: Medicare Other

## 2019-09-24 ENCOUNTER — Ambulatory Visit: Payer: Medicare Other

## 2019-09-27 ENCOUNTER — Encounter: Payer: Medicare Other | Admitting: Physical Therapy

## 2019-10-01 ENCOUNTER — Ambulatory Visit: Payer: Federal, State, Local not specified - PPO | Admitting: Physical Therapy

## 2019-12-21 ENCOUNTER — Ambulatory Visit: Payer: Federal, State, Local not specified - PPO | Attending: Internal Medicine

## 2019-12-21 DIAGNOSIS — Z23 Encounter for immunization: Secondary | ICD-10-CM | POA: Insufficient documentation

## 2019-12-21 NOTE — Progress Notes (Signed)
   Covid-19 Vaccination Clinic  Name:  Stacy Duncan    MRN: [id number] DOB: [birth year]  12/21/2019  Stacy Duncan was observed post Covid-19 immunization for 30 minutes based on pre-vaccination screening without incidence. She was provided with Vaccine Information Sheet and instruction to access the V-Safe system.   Stacy Duncan was instructed to call 911 with any severe reactions post vaccine: Marland Kitchen Difficulty breathing  . Swelling of your face and throat  . A fast heartbeat  . A bad rash all over your body  . Dizziness and weakness    Immunizations Administered    Name Date Dose VIS Date Route   Pfizer COVID-19 Vaccine 12/21/2019 10:23 AM 0.3 mL 10/05/2019 Intramuscular   Manufacturer: [place]   Lot: [id number]   [place]: [id number]

## 2020-01-15 ENCOUNTER — Ambulatory Visit: Payer: Federal, State, Local not specified - PPO | Attending: Internal Medicine

## 2020-01-15 DIAGNOSIS — Z23 Encounter for immunization: Secondary | ICD-10-CM

## 2020-01-15 NOTE — Progress Notes (Signed)
   Covid-19 Vaccination Clinic  Name:  Stacy Duncan    MRN: [id number] DOB: 1950-10-27  01/15/2020  [name] was observed post Covid-19 immunization for 15 minutes without incident. She was provided with Vaccine Information Sheet and instruction to access the V-Safe system.   [name] was instructed to call 911 with any severe reactions post vaccine: Marland Kitchen Difficulty breathing  . Swelling of face and throat  . A fast heartbeat  . A bad rash all over body  . Dizziness and weakness   Immunizations Administered    Name Date Dose VIS Date Route   Pfizer COVID-19 Vaccine 01/15/2020  2:37 PM 0.3 mL 10/05/2019 Intramuscular   Manufacturer: [place]   Lot: [id number]   [place]: [id number]

## 2020-03-25 ENCOUNTER — Emergency Department (HOSPITAL_BASED_OUTPATIENT_CLINIC_OR_DEPARTMENT_OTHER)
Admission: EM | Admit: 2020-03-25 | Discharge: 2020-03-25 | Disposition: A | Discharge status: Other Acute Inpatient Hospital | Payer: Medicare Other | Attending: Emergency Medicine | Admitting: Emergency Medicine

## 2020-03-25 ENCOUNTER — Emergency Department (HOSPITAL_BASED_OUTPATIENT_CLINIC_OR_DEPARTMENT_OTHER): Payer: Medicare Other

## 2020-03-25 ENCOUNTER — Encounter (HOSPITAL_BASED_OUTPATIENT_CLINIC_OR_DEPARTMENT_OTHER): Payer: Self-pay | Admitting: *Deleted

## 2020-03-25 ENCOUNTER — Emergency Department (HOSPITAL_COMMUNITY): Payer: Medicare Other | Admitting: Certified Registered Nurse Anesthetist

## 2020-03-25 ENCOUNTER — Emergency Department (HOSPITAL_COMMUNITY): Payer: Medicare Other

## 2020-03-25 ENCOUNTER — Other Ambulatory Visit: Payer: Self-pay

## 2020-03-25 ENCOUNTER — Encounter (HOSPITAL_COMMUNITY)
Admission: EM | Disposition: A | Discharge status: Other Acute Inpatient Hospital | Payer: Self-pay | Source: Home / Self Care | Attending: Emergency Medicine

## 2020-03-25 DIAGNOSIS — Y92531 Health care provider office as the place of occurrence of the external cause: Secondary | ICD-10-CM | POA: Insufficient documentation

## 2020-03-25 DIAGNOSIS — Y9389 Activity, other specified: Secondary | ICD-10-CM | POA: Insufficient documentation

## 2020-03-25 DIAGNOSIS — Z79899 Other long term (current) drug therapy: Secondary | ICD-10-CM | POA: Insufficient documentation

## 2020-03-25 DIAGNOSIS — K449 Diaphragmatic hernia without obstruction or gangrene: Secondary | ICD-10-CM | POA: Diagnosis not present

## 2020-03-25 DIAGNOSIS — T189XXA Foreign body of alimentary tract, part unspecified, initial encounter: Secondary | ICD-10-CM | POA: Diagnosis not present

## 2020-03-25 DIAGNOSIS — Z96653 Presence of artificial knee joint, bilateral: Secondary | ICD-10-CM | POA: Insufficient documentation

## 2020-03-25 DIAGNOSIS — W458XXA Other foreign body or object entering through skin, initial encounter: Secondary | ICD-10-CM | POA: Insufficient documentation

## 2020-03-25 DIAGNOSIS — T182XXA Foreign body in stomach, initial encounter: Secondary | ICD-10-CM

## 2020-03-25 DIAGNOSIS — Y999 Unspecified external cause status: Secondary | ICD-10-CM | POA: Diagnosis not present

## 2020-03-25 DIAGNOSIS — F17210 Nicotine dependence, cigarettes, uncomplicated: Secondary | ICD-10-CM | POA: Insufficient documentation

## 2020-03-25 DIAGNOSIS — Z7982 Long term (current) use of aspirin: Secondary | ICD-10-CM | POA: Diagnosis not present

## 2020-03-25 DIAGNOSIS — K2100 Gastro-esophageal reflux disease with esophagitis, without bleeding: Secondary | ICD-10-CM | POA: Insufficient documentation

## 2020-03-25 DIAGNOSIS — K2970 Gastritis, unspecified, without bleeding: Secondary | ICD-10-CM | POA: Insufficient documentation

## 2020-03-25 DIAGNOSIS — I10 Essential (primary) hypertension: Secondary | ICD-10-CM | POA: Insufficient documentation

## 2020-03-25 DIAGNOSIS — Z20822 Contact with and (suspected) exposure to covid-19: Secondary | ICD-10-CM | POA: Diagnosis not present

## 2020-03-25 HISTORY — PX: FOREIGN BODY REMOVAL: SHX962

## 2020-03-25 HISTORY — PX: ESOPHAGOGASTRODUODENOSCOPY (EGD) WITH PROPOFOL: SHX5813

## 2020-03-25 LAB — CBC WITH DIFFERENTIAL/PLATELET
Abs Immature Granulocytes: 0.03 10*3/uL (ref 0.00–0.07)
Basophils Absolute: 0.1 10*3/uL (ref 0.0–0.1)
Basophils Relative: 1 %
Eosinophils Absolute: 0.2 10*3/uL (ref 0.0–0.5)
Eosinophils Relative: 2 %
HCT: 42.5 % (ref 36.0–46.0)
Hemoglobin: 14.6 g/dL (ref 12.0–15.0)
Immature Granulocytes: 0 %
Lymphocytes Relative: 28 %
Lymphs Abs: 2.5 10*3/uL (ref 0.7–4.0)
MCH: 29.5 pg (ref 26.0–34.0)
MCHC: 34.4 g/dL (ref 30.0–36.0)
MCV: 85.9 fL (ref 80.0–100.0)
Monocytes Absolute: 0.5 10*3/uL (ref 0.1–1.0)
Monocytes Relative: 6 %
Neutro Abs: 5.7 10*3/uL (ref 1.7–7.7)
Neutrophils Relative %: 63 %
Platelets: 271 10*3/uL (ref 150–400)
RBC: 4.95 MIL/uL (ref 3.87–5.11)
RDW: 14.1 % (ref 11.5–15.5)
WBC: 9.1 10*3/uL (ref 4.0–10.5)
nRBC: 0 % (ref 0.0–0.2)

## 2020-03-25 LAB — BASIC METABOLIC PANEL
Anion gap: 11 (ref 5–15)
BUN: 14 mg/dL (ref 8–23)
CO2: 26 mmol/L (ref 22–32)
Calcium: 9 mg/dL (ref 8.9–10.3)
Chloride: 103 mmol/L (ref 98–111)
Creatinine, Ser: 1.09 mg/dL — ABNORMAL HIGH (ref 0.44–1.00)
GFR calc Af Amer: 60 mL/min — ABNORMAL LOW (ref >60)
GFR calc non Af Amer: 52 mL/min — ABNORMAL LOW (ref >60)
Glucose, Bld: 124 mg/dL — ABNORMAL HIGH (ref 70–99)
Potassium: 3.2 mmol/L — ABNORMAL LOW (ref 3.5–5.1)
Sodium: 140 mmol/L (ref 135–145)

## 2020-03-25 LAB — SARS CORONAVIRUS 2 BY RT PCR (HOSPITAL ORDER, PERFORMED IN ~~LOC~~ HOSPITAL LAB): SARS Coronavirus 2: NEGATIVE

## 2020-03-25 SURGERY — ESOPHAGOGASTRODUODENOSCOPY (EGD) WITH PROPOFOL
Anesthesia: General

## 2020-03-25 MED ORDER — ONDANSETRON HCL 4 MG/2ML IJ SOLN
INTRAMUSCULAR | Status: DC | PRN
  Administered 2020-03-25: 4 mg via INTRAVENOUS

## 2020-03-25 MED ORDER — PROPOFOL 10 MG/ML IV BOLUS
INTRAVENOUS | Status: AC
  Filled 2020-03-25: qty 20

## 2020-03-25 MED ORDER — PROPOFOL 10 MG/ML IV BOLUS
INTRAVENOUS | Status: DC | PRN
  Administered 2020-03-25: 200 mg via INTRAVENOUS

## 2020-03-25 MED ORDER — SODIUM CHLORIDE 0.9 % IV SOLN: INTRAVENOUS | Status: DC

## 2020-03-25 MED ORDER — PANTOPRAZOLE SODIUM 40 MG PO TBEC
40.0000 mg | DELAYED_RELEASE_TABLET | Freq: Every day | ORAL | 2 refills | Status: DC
Start: 2020-03-25 — End: 2020-04-10

## 2020-03-25 MED ORDER — SUCCINYLCHOLINE CHLORIDE 20 MG/ML IJ SOLN
INTRAMUSCULAR | Status: DC | PRN
  Administered 2020-03-25: 120 mg via INTRAVENOUS

## 2020-03-25 MED ORDER — MIDAZOLAM HCL 2 MG/2ML IJ SOLN
INTRAMUSCULAR | Status: AC
  Filled 2020-03-25: qty 2

## 2020-03-25 MED ORDER — LIDOCAINE 2% (20 MG/ML) 5 ML SYRINGE
INTRAMUSCULAR | Status: DC | PRN
  Administered 2020-03-25: 80 mg via INTRAVENOUS

## 2020-03-25 MED ORDER — LACTATED RINGERS IV SOLN
INTRAVENOUS | Status: DC
  Administered 2020-03-25: 1000 mL via INTRAVENOUS

## 2020-03-25 MED ORDER — MIDAZOLAM HCL 5 MG/5ML IJ SOLN
INTRAMUSCULAR | Status: DC | PRN
  Administered 2020-03-25: 2 mg via INTRAVENOUS

## 2020-03-25 SURGICAL SUPPLY — 15 items

## 2020-03-25 NOTE — ED Triage Notes (Signed)
While at the dentist this am she swallowed an "implant screw driver" during a procedure.

## 2020-03-25 NOTE — Progress Notes (Signed)
Report given to ED RN from Endo.

## 2020-03-25 NOTE — Anesthesia Preprocedure Evaluation (Signed)
Anesthesia Evaluation  Patient identified by MRN, date of birth, ID band Patient awake    Reviewed: Allergy & Precautions, NPO status , Patient's Chart, lab work & pertinent test results  Airway Mallampati: II  TM Distance: >3 FB Neck ROM: Full    Dental no notable dental hx.    Pulmonary Current Smoker and Patient abstained from smoking.,    Pulmonary exam normal breath sounds clear to auscultation       Cardiovascular hypertension, Normal cardiovascular exam Rhythm:Regular Rate:Normal     Neuro/Psych negative neurological ROS  negative psych ROS   GI/Hepatic negative GI ROS, Neg liver ROS,   Endo/Other  negative endocrine ROS  Renal/GU negative Renal ROS  negative genitourinary   Musculoskeletal negative musculoskeletal ROS (+)   Abdominal   Peds negative pediatric ROS (+)  Hematology negative hematology ROS (+)   Anesthesia Other Findings   Reproductive/Obstetrics negative OB ROS                             Anesthesia Physical Anesthesia Plan  ASA: II and emergent  Anesthesia Plan: General   Post-op Pain Management:    Induction: Intravenous and Rapid sequence  PONV Risk Score and Plan: Ondansetron, Dexamethasone and Treatment may vary due to age or medical condition  Airway Management Planned: Oral ETT  Additional Equipment:   Intra-op Plan:   Post-operative Plan: Extubation in OR  Informed Consent: I have reviewed the patients History and Physical, chart, labs and discussed the procedure including the risks, benefits and alternatives for the proposed anesthesia with the patient or authorized representative who has indicated his/her understanding and acceptance.     Dental advisory given  Plan Discussed with: CRNA and Surgeon  Anesthesia Plan Comments:         Anesthesia Quick Evaluation

## 2020-03-25 NOTE — ED Provider Notes (Signed)
[facility] EMERGENCY DEPARTMENT Provider Note   CSN: [id number] Arrival date & time: 03/25/20  1203     History Chief Complaint  Patient presents with  . Swallowed Foreign Body    Stacy Duncan is a 69 y.o. female.  The history is provided by the patient.  Illness Severity:  Mild Onset quality:  Gradual Duration:  2 hours Chronicity:  New Context:  Patient while at dentist having a procedure done swallowed an implant screw driver that is 2.5 cm x 0.7 cm.  Per the dentist object does not have a sharp and, not magnetic and all one piece.  Patient has no abdominal pain, no respiratory symptoms. Relieved by:  Nothing Worsened by:  Nothing Associated symptoms: no abdominal pain, no chest pain, no cough, no ear pain, no fever, no rash, no shortness of breath, no sore throat and no vomiting        Past Medical History:  Diagnosis Date  . Cataract   . Depression   . Heart murmur   . Hypercholesteremia   . Hypertension     Patient Active Problem List   Diagnosis Date Noted  . Primary localized osteoarthritis of left knee 08/07/2019  . Primary osteoarthritis of left knee 08/07/2019  . Primary localized osteoarthritis of right knee 11/21/2018  . Primary osteoarthritis of right knee 11/21/2018  . Personal history of colonic adenoma 08/08/2013  . Family history of colon cancer - elderly mother 08/08/2013    Past Surgical History:  Procedure Laterality Date  . ANKLE SURGERY Right   . BREAST LUMPECTOMY Left    mid 109s  . COLONOSCOPY  07/31/2013  . KNEE SURGERY Left   . POLYPECTOMY    . TOTAL KNEE ARTHROPLASTY Right 11/21/2018   Procedure: TOTAL KNEE ARTHROPLASTY;  Surgeon: Melrose Nakayama, MD;  Location: [facility];  Service: Orthopedics;  Laterality: Right;  . TOTAL KNEE ARTHROPLASTY Left 08/07/2019   Procedure: LEFT TOTAL KNEE ARTHROPLASTY;  Surgeon: Melrose Nakayama, MD;  Location: [facility];  Service: Orthopedics;  Laterality: Left;     OB History   No obstetric  history on file.     Family History  Problem Relation Age of Onset  . Colon cancer Mother 83  . Colon polyps Neg Hx   . Esophageal cancer Neg Hx   . Rectal cancer Neg Hx   . Stomach cancer Neg Hx     Social History   Tobacco Use  . Smoking status: Current Every Day Smoker    Packs/day: 0.50    Types: Cigarettes    Last attempt to quit: 10/25/2006    Years since quitting: 13.4  . Smokeless tobacco: Never Used  Substance Use Topics  . Alcohol use: Yes    Alcohol/week: 2.0 standard drinks    Types: 2 Glasses of wine per week    Comment: per week  . Drug use: No    Home Medications Prior to Admission medications   Medication Sig Start Date End Date Taking? Authorizing Provider  acetaminophen (TYLENOL) 500 MG tablet Take 500 mg by mouth every 6 (six) hours as needed for moderate pain.    [provider]  aspirin EC 81 MG tablet Take 1 tablet (81 mg total) by mouth 2 (two) times daily after a meal. 08/08/19   Loni Dolly, PA-C  chlorthalidone (HYGROTON) 25 MG tablet Take 25 mg by mouth daily.  05/31/17   [provider]  escitalopram (LEXAPRO) 10 MG tablet Take 10 mg by mouth daily.  05/31/17  [provider]  HYDROcodone-acetaminophen (NORCO/VICODIN) 5-325 MG tablet Take 1-2 tablets by mouth every 6 (six) hours as needed for moderate pain (pain score 4-6). 08/08/19   Loni Dolly, PA-C  mometasone (NASONEX) 50 MCG/ACT nasal spray Place 1 spray into the nose daily as needed (allergies.).  06/20/18   [provider]  Multiple Vitamin (MULTIVITAMIN WITH MINERALS) TABS tablet Take 1 tablet by mouth daily.    [provider]  rosuvastatin (CRESTOR) 20 MG tablet Take 20 mg by mouth daily.  05/31/17 07/25/20  [provider]  tiZANidine (ZANAFLEX) 4 MG tablet Take 1 tablet (4 mg total) by mouth every 6 (six) hours as needed. 08/08/19 08/07/20  Loni Dolly, PA-C  Vitamin D, Ergocalciferol, (DRISDOL) 1.25 MG (50000 UT) CAPS capsule Take  50,000 Units by mouth every Sunday.  06/20/18   [provider]    Allergies    Patient has no known allergies.  Review of Systems   Review of Systems  Constitutional: Negative for chills and fever.  HENT: Negative for ear pain and sore throat.   Eyes: Negative for pain and visual disturbance.  Respiratory: Negative for cough and shortness of breath.   Cardiovascular: Negative for chest pain and palpitations.  Gastrointestinal: Negative for abdominal pain and vomiting.  Genitourinary: Negative for dysuria and hematuria.  Musculoskeletal: Negative for arthralgias and back pain.  Skin: Negative for color change and rash.  Neurological: Negative for seizures and syncope.  All other systems reviewed and are negative.   Physical Exam Updated Vital Signs  ED Triage Vitals  Enc Vitals Group     BP 03/25/20 1212 (!) 166/84     Pulse Rate 03/25/20 1212 88     Resp 03/25/20 1212 18     Temp 03/25/20 1212 98.5 F (36.9 C)     Temp Source 03/25/20 1212 Oral     SpO2 03/25/20 1212 99 %     Weight 03/25/20 1210 203 lb 0.7 oz (92.1 kg)     Height 03/25/20 1210 5\' 5"  (1.651 m)     Head Circumference --      Peak Flow --      Pain Score 03/25/20 1210 0     Pain Loc --      Pain Edu? --      Excl. in [place]? --     Physical Exam Vitals and nursing note reviewed.  Constitutional:      General: She is not in acute distress.    Appearance: She is well-developed. She is not ill-appearing.  HENT:     Head: Normocephalic and atraumatic.  Eyes:     Conjunctiva/sclera: Conjunctivae normal.  Cardiovascular:     Rate and Rhythm: Normal rate and regular rhythm.     Pulses: Normal pulses.     Heart sounds: No murmur.  Pulmonary:     Effort: Pulmonary effort is normal. No respiratory distress.     Breath sounds: Normal breath sounds.  Abdominal:     General: There is no distension.     Palpations: Abdomen is soft.     Tenderness: There is no abdominal tenderness.  Musculoskeletal:      Cervical back: Normal range of motion and neck supple.  Skin:    General: Skin is warm and dry.  Neurological:     Mental Status: She is alert.     ED Results / Procedures / Treatments   Labs (all labs ordered are listed, but only abnormal results are displayed) Labs Reviewed  SARS CORONAVIRUS 2 BY RT PCR (HOSPITAL ORDER, [facility])  CBC WITH DIFFERENTIAL/PLATELET  BASIC METABOLIC PANEL    EKG None  Radiology DG Abd 1 View  Result Date: 03/25/2020 CLINICAL DATA:  Ingested foreign body EXAM: ABDOMEN - 1 VIEW COMPARISON:  None. FINDINGS: 3.0 x 0.8 cm metallic foreign body projects within the right hemiabdomen, in the region of the distal stomach or proximal duodenum. The bowel gas pattern is normal. No radio-opaque calculi or other significant radiographic abnormality are seen. Mild lower lumbar spondylosis. IMPRESSION: 3.0 x 0.8 cm metallic foreign body projects within the right hemiabdomen, likely in the region of the distal stomach or proximal duodenum. Electronically Signed   By: Davina Poke D.O.   On: 03/25/2020 13:01    Procedures Procedures (including critical care time)  Medications Ordered in ED Medications - No data to display  ED Course  I have reviewed the triage vital signs and the nursing notes.  Pertinent labs & imaging results that were available during my care of the patient were reviewed by me and considered in my medical decision making (see chart for details).    MDM Rules/Calculators/A&P                      Stacy Duncan is a 69 year old female with no significant medical history presents the ED after accidentally swallowing foreign body at dentist office.  Patient with normal vitals.  No fever.  Patient while under some sedation swallowed an implant screwdriver.  I talked with the dentist on the phone who states that the objects about 2.5 x 1 cm.  1 and is cylindrical and screwdriver type shape and the other end is flat.   Object is not magnetic.  It is all 1 piece.  Patient has no respiratory symptoms.  No GI symptoms.  States that she actually ate mashed potatoes prior to coming here.  X-ray showed 3 cm x 0.8 cm metallic foreign body within the right hemiabdomen likely in the distal stomach or proximal duodenum.  I talked with [facility] team at [facility] [place] and they recommend an ED to ED transfer for repeat x-ray.  If metallic object is still at similar spot will likely perform endoscopy to remove.  If it has progressed she would likely recommend observation admission to ensure that object passes given its shape.  Basic labs were ordered and Covid screening were ordered in case patient needs admission/procedure.  Dr. Sherry Ruffing at [facility] has accepted patient in transfer.  Hemodynamically stable throughout my care.  This chart was dictated using voice recognition software.  Despite best efforts to proofread,  errors can occur which can change the documentation meaning.    Final Clinical Impression(s) / ED Diagnoses Final diagnoses:  Swallowed foreign body, initial encounter    Rx / DC Orders ED Discharge Orders    None       Lennice Sites, DO 03/25/20 1347

## 2020-03-25 NOTE — Transfer of Care (Signed)
Immediate Anesthesia Transfer of Care Note  Patient: Stacy Duncan  Procedure(s) Performed: ESOPHAGOGASTRODUODENOSCOPY (EGD) WITH PROPOFOL (N/A ) FOREIGN BODY REMOVAL  Patient Location: PACU  Anesthesia Type:General  Level of Consciousness: sedated, patient cooperative and responds to stimulation  Airway & Oxygen Therapy: Patient Spontanous Breathing and Patient connected to face mask oxygen  Post-op Assessment: Report given to RN and Post -op Vital signs reviewed and stable  Post vital signs: Reviewed and stable  Last Vitals:  Vitals Value Taken Time  BP    Temp    Pulse    Resp    SpO2      Last Pain:  Vitals:   03/25/20 1637  TempSrc: Oral  PainSc: 5          Complications: No apparent anesthesia complications

## 2020-03-25 NOTE — ED Notes (Signed)
ED Provider at bedside. 

## 2020-03-25 NOTE — ED Provider Notes (Signed)
Care of the patient assumed on arrival to [facility] with swallowed FB at Dentist earlier today. Had FB on xray at [facility], GI recommended reimaging on arrival and if not moved distal, plan extraction. Otherwise admit for observation due to risk of perforation. Patient at mashed potatoes prior to eval at [facility].   Patient awake and alert Abdomen is soft, no peritoneal signs  DG Abdomen 1 View  Result Date: 03/25/2020 CLINICAL DATA:  Evaluate foreign body location EXAM: ABDOMEN - 1 VIEW COMPARISON:  Earlier today FINDINGS: Metallic foreign body again projects over the right abdomen, in similar location to prior study. It is difficult to determine if this is within the distal stomach, duodenum, or less likely hepatic flexure of the colon. No free air or organomegaly. Calcified phleboliths in the left lower pelvis. IMPRESSION: Metallic foreign body remains in the right abdomen in similar position to prior study. No free air. Electronically Signed   By: Rolm Baptise M.D.   On: 03/25/2020 15:36   DG Abd 1 View  Result Date: 03/25/2020 CLINICAL DATA:  Ingested foreign body EXAM: ABDOMEN - 1 VIEW COMPARISON:  None. FINDINGS: 3.0 x 0.8 cm metallic foreign body projects within the right hemiabdomen, in the region of the distal stomach or proximal duodenum. The bowel gas pattern is normal. No radio-opaque calculi or other significant radiographic abnormality are seen. Mild lower lumbar spondylosis. IMPRESSION: 3.0 x 0.8 cm metallic foreign body projects within the right hemiabdomen, likely in the region of the distal stomach or proximal duodenum. Electronically Signed   By: Davina Poke D.O.   On: 03/25/2020 13:01   3:50 PM  Xray repeated, no change in position. Will discuss with GI.   4:00 PM Spoke with Alonza Bogus, PA with GI who will come evaluate the patient, plan endoscopy with FB removal later today.   6:16 PM Patient back from endoscopy with successful removal of her FB. Patient is awake and  alert, ready to go home. Given Rx for PPI by GI.    Truddie Hidden, MD 03/25/20 1816

## 2020-03-25 NOTE — Discharge Instructions (Addendum)
Upper Endoscopy, Adult, Care After  This sheet gives you information about how to care for yourself after your procedure. Your health care provider may also give you more specific instructions. If you have problems or questions, contact your health care provider.  What can I expect after the procedure?  After the procedure, it is common to have:  · A sore throat.  · Mild stomach pain or discomfort.  · Bloating.  · Nausea.  Follow these instructions at home:    · Follow instructions from your health care provider about what to eat or drink after your procedure.  · Return to your normal activities as told by your health care provider. Ask your health care provider what activities are safe for you.  · Take over-the-counter and prescription medicines only as told by your health care provider.  · Do not drive for 24 hours if you were given a sedative during your procedure.  · Keep all follow-up visits as told by your health care provider. This is important.  Contact a health care provider if you have:  · A sore throat that lasts longer than one day.  · Trouble swallowing.  Get help right away if:  · You vomit blood or your vomit looks like coffee grounds.  · You have:  ? A fever.  ? Bloody, black, or tarry stools.  ? A severe sore throat or you cannot swallow.  ? Difficulty breathing.  ? Severe pain in your chest or abdomen.  Summary  · After the procedure, it is common to have a sore throat, mild stomach discomfort, bloating, and nausea.  · Do not drive for 24 hours if you were given a sedative during the procedure.  · Follow instructions from your health care provider about what to eat or drink after your procedure.  · Return to your normal activities as told by your health care provider.  This information is not intended to replace advice given to you by your health care provider. Make sure you discuss any questions you have with your health care provider.  Document Revised: 04/04/2018 Document Reviewed:  03/13/2018  Elsevier Patient Education © 2020 [employer] Inc[employer]

## 2020-03-25 NOTE — Op Note (Addendum)
[facility] Patient Name: Stacy Duncan Procedure Date: 03/25/2020 MRN: [id number] Attending MD: Mauri Pole , MD Date of Birth: 12/23/1950 CSN: [id number] Age: 69 Admit Type: Outpatient Procedure:                Upper GI endoscopy Indications:              Removal of foreign body in the stomach, Removal of                            foreign body in the small bowel, Removal of foreign                            body in the GI tract Providers:                Mauri Pole, MD, Benetta Spar RN, RN,                            Doristine Johns, RN, Elspeth Cho Tech.,                            Technician, Herbie Drape, CRNA Referring MD:              Medicines:                Monitored Anesthesia Care, General Anesthesia Complications:            No immediate complications. Estimated Blood Loss:     Estimated blood loss was minimal. Procedure:                Pre-Anesthesia Assessment:                           - Prior to the procedure, a History and Physical                            was performed, and patient medications and                            allergies were reviewed. The patient's tolerance of                            previous anesthesia was also reviewed. The risks                            and benefits of the procedure and the sedation                            options and risks were discussed with the patient.                            All questions were answered, and informed consent                            was obtained. Prior Anticoagulants: The patient has  taken no previous anticoagulant or antiplatelet                            agents. ASA Grade Assessment: II - A patient with                            mild systemic disease. After reviewing the risks                            and benefits, the patient was deemed in                            satisfactory condition to undergo the procedure.                 After obtaining informed consent, the endoscope was                            passed under direct vision. Throughout the                            procedure, the patient's blood pressure, pulse, and                            oxygen saturations were monitored continuously. The                            [id number]) was introduced through the                            mouth, and advanced to the second part of duodenum.                            The upper GI endoscopy was accomplished without                            difficulty. The patient tolerated the procedure                            well. Scope In: Scope Out: Findings:      LA Grade C (one or more mucosal breaks continuous between tops of 2 or       more mucosal folds, less than 75% circumference) esophagitis was found       30 to 38 cm from the incisors.      A large hiatal hernia was present.      Patchy mild inflammation characterized by congestion (edema), erosions       and erythema was found at the gastroesophageal junction, in the cardia,       in the gastric antrum and in the prepyloric region of the stomach.      Dental instrument was found in the prepyloric region of the stomach.      Removal was accomplished with a Roth net.      The examined duodenum was normal. Impression:               - LA Grade C reflux  esophagitis.                           - Large hiatal hernia.                           - Gastritis.                           - Dental instrument was found in the prepyloric                            region of the stomach. Removal was successful.                           - Normal examined duodenum. Moderate Sedation:      Not Applicable - Patient had care per Anesthesia. Recommendation:           - Patient has a contact number available for                            emergencies. The signs and symptoms of potential                            delayed complications were discussed with  the                            patient. Return to normal activities tomorrow.                            Written discharge instructions were provided to the                            patient.                           - Resume previous diet.                           - Continue present medications.                           - Follow an antireflux regimen indefinitely.                           - Use Protonix (pantoprazole) 40 mg PO daily. Procedure Code(s):        --- Professional ---                           [phone], Esophagogastroduodenoscopy, flexible,                            transoral; with removal of foreign body(s) Diagnosis Code(s):        --- Professional ---                           K21.00, Gastro-esophageal reflux disease with  esophagitis, without bleeding                           K44.9, Diaphragmatic hernia without obstruction or                            gangrene                           K29.70, Gastritis, unspecified, without bleeding                           T18.2XXA, Foreign body in stomach, initial encounter                           T18.3XXA, Foreign body in small intestine, initial                            encounter                           T18.9XXA, Foreign body of alimentary tract, part                            unspecified, initial encounter CPT copyright 2019 American Medical Association. All rights reserved. The codes documented in this report are preliminary and upon coder review may  be revised to meet current compliance requirements. Mauri Pole, MD 03/25/2020 5:35:35 PM This report has been signed electronically. Number of Addenda: 0

## 2020-03-25 NOTE — Anesthesia Procedure Notes (Signed)
Procedure Name: Intubation Performed by: Odis Turck J, CRNA Pre-anesthesia Checklist: Patient identified, Emergency Drugs available, Suction available, Patient being monitored and Timeout performed Patient Re-evaluated:Patient Re-evaluated prior to induction Oxygen Delivery Method: Circle system utilized Preoxygenation: Pre-oxygenation with 100% oxygen Induction Type: IV induction and Rapid sequence Laryngoscope Size: Mac and 3 Grade View: Grade I Tube type: Oral Tube size: 7.0 mm Number of attempts: 1 Airway Equipment and Method: Stylet Placement Confirmation: ETT inserted through vocal cords under direct vision,  positive ETCO2 and breath sounds checked- equal and bilateral Secured at: 21 cm Tube secured with: Tape Dental Injury: Teeth and Oropharynx as per pre-operative assessment        

## 2020-03-25 NOTE — Consult Note (Signed)
Referring Provider:  Dr. Ronnald Nian, EDP Primary Care Physician:  System, Pcp Not In Primary Gastroenterologist:  Dr. Carlean Purl  Reason for Consultation:  Swallowed foreign body  HPI: Stacy Duncan is a 69 y.o. female with limited past medical history as listed below.  Apparently she was at the dentist today and a very small screwdriver type instrument was accidentally let go of by the dentist during the procedure and the patient swallowed the item.  She presented to [facility] where abdominal x-ray showed a 3 cm x 0.8 cm idem in the distal stomach/proximal duodenum.  Unfortunately, the patient ate lunch, consisting of mashed potatoes after the incident.  She denies any complaints and looks well.   Past Medical History:  Diagnosis Date  . Cataract   . Depression   . Heart murmur   . Hypercholesteremia   . Hypertension     Past Surgical History:  Procedure Laterality Date  . ANKLE SURGERY Right   . BREAST LUMPECTOMY Left    mid 54s  . COLONOSCOPY  07/31/2013  . KNEE SURGERY Left   . POLYPECTOMY    . 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 TOTAL KNEE ARTHROPLASTY Left 08/07/2019   Procedure: LEFT TOTAL KNEE ARTHROPLASTY;  Surgeon: Melrose Nakayama, MD;  Location: [facility];  Service: Orthopedics;  Laterality: Left;    Prior to Admission medications   Medication Sig Start Date End Date Taking? Authorizing Provider  acetaminophen (TYLENOL) 500 MG tablet Take 500 mg by mouth every 6 (six) hours as needed for moderate pain.    [provider]  aspirin EC 81 MG tablet Take 1 tablet (81 mg total) by mouth 2 (two) times daily after a meal. 08/08/19   Loni Dolly, PA-C  chlorthalidone (HYGROTON) 25 MG tablet Take 25 mg by mouth daily.  05/31/17   [provider]  escitalopram (LEXAPRO) 10 MG tablet Take 10 mg by mouth daily.  05/31/17    [provider]  HYDROcodone-acetaminophen (NORCO/VICODIN) 5-325 MG tablet Take 1-2 tablets by mouth every 6 (six) hours as needed for moderate pain (pain score 4-6). 08/08/19   Loni Dolly, PA-C  mometasone (NASONEX) 50 MCG/ACT nasal spray Place 1 spray into the nose daily as needed (allergies.).  06/20/18   [provider]  Multiple Vitamin (MULTIVITAMIN WITH MINERALS) TABS tablet Take 1 tablet by mouth daily.    [provider]  rosuvastatin (CRESTOR) 20 MG tablet Take 20 mg by mouth daily.  05/31/17 07/25/20  [provider]  tiZANidine (ZANAFLEX) 4 MG tablet Take 1 tablet (4 mg total) by mouth every 6 (six) hours as needed. 08/08/19 08/07/20  Loni Dolly, PA-C  Vitamin D, Ergocalciferol, (DRISDOL) 1.25 MG (50000 UT) CAPS capsule Take 50,000 Units by mouth every Sunday.  06/20/18   [provider]    No current facility-administered medications for this encounter.   Current Outpatient Medications  Medication Sig Dispense Refill  . acetaminophen (TYLENOL) 500 MG tablet Take 500 mg by mouth every 6 (six) hours as needed for moderate pain.    Marland Kitchen aspirin EC 81 MG tablet Take 1 tablet (81 mg total) by mouth 2 (two) times daily after a meal. 60 tablet 0  . chlorthalidone (HYGROTON) 25 MG tablet Take 25 mg by mouth daily.     Marland Kitchen escitalopram (LEXAPRO) 10 MG tablet Take 10 mg by mouth daily.     Marland Kitchen  HYDROcodone-acetaminophen (NORCO/VICODIN) 5-325 MG tablet Take 1-2 tablets by mouth every 6 (six) hours as needed for moderate pain (pain score 4-6). 40 tablet 0  . mometasone (NASONEX) 50 MCG/ACT nasal spray Place 1 spray into the nose daily as needed (allergies.).     Marland Kitchen Multiple Vitamin (MULTIVITAMIN WITH MINERALS) TABS tablet Take 1 tablet by mouth daily.    . rosuvastatin (CRESTOR) 20 MG tablet Take 20 mg by mouth daily.     Marland Kitchen tiZANidine (ZANAFLEX) 4 MG tablet Take 1 tablet (4 mg total) by mouth every 6 (six) hours as needed. 40 tablet 1  . Vitamin D,  Ergocalciferol, (DRISDOL) 1.25 MG (50000 UT) CAPS capsule Take 50,000 Units by mouth every Sunday.       Allergies as of 03/25/2020  . (No Known Allergies)    Family History  Problem Relation Age of Onset  . Colon cancer Mother 45  . Colon polyps Neg Hx   . Esophageal cancer Neg Hx   . Rectal cancer Neg Hx   . Stomach cancer Neg Hx     Social History   Socioeconomic History  . Marital status: Widowed    Spouse name: Not on file  . Number of children: Not on file  . Years of education: Not on file  . Highest education level: Not on file  Occupational History  . Not on file  Tobacco Use  . Smoking status: Current Every Day Smoker    Packs/day: 0.50    Types: Cigarettes    Last attempt to quit: 10/25/2006    Years since quitting: 13.4  . Smokeless tobacco: Never Used  Substance and Sexual Activity  . Alcohol use: Yes    Alcohol/week: 2.0 standard drinks    Types: 2 Glasses of wine per week    Comment: per week  . Drug use: No  . Sexual activity: Not on file  Other Topics Concern  . Not on file  Social History Narrative  . Not on file   Social Determinants of Health   Financial Resource Strain:   . Difficulty of Paying Living Expenses:   Food Insecurity:   . Worried About Charity fundraiser in the Last Year:   . Arboriculturist in the Last Year:   Transportation Needs:   . Film/video editor (Medical):   Marland Kitchen Lack of Transportation (Non-Medical):   Physical Activity:   . Days of Exercise per Week:   . Minutes of Exercise per Session:   Stress:   . Feeling of Stress :   Social Connections:   . Frequency of Communication with Friends and Family:   . Frequency of Social Gatherings with Friends and Family:   . Attends Religious Services:   . Active Member of Clubs or Organizations:   . Attends Archivist Meetings:   Marland Kitchen Marital Status:   Intimate Partner Violence:   . Fear of Current or Ex-Partner:   . Emotionally Abused:   Marland Kitchen Physically Abused:     . Sexually Abused:     Review of Systems: ROS is O/W negative except as mentioned in HPI.  Physical Exam: Vital signs in last 24 hours: Temp:  [98.5 F (36.9 C)] 98.5 F (36.9 C) (06/01 1212) Pulse Rate:  [88] 88 (06/01 1212) Resp:  [18] 18 (06/01 1212) BP: (166)/(84) 166/84 (06/01 1212) SpO2:  [99 %] 99 % (06/01 1212) Weight:  [92.1 kg] 92.1 kg (06/01 1210)   General:   Alert,  Well-developed, well-nourished,  pleasant and cooperative in NAD Head:  Normocephalic and atraumatic. Eyes:  Sclera clear, no icterus.   Conjunctiva pink. Ears:  Normal auditory acuity. Mouth:  No deformity or lesions.   Lungs:  Clear throughout to auscultation.   No wheezes, crackles, or rhonchi.  Heart:  Regular rate and rhythm; no murmurs, clicks, rubs,  or gallops. Abdomen:  Soft,nontender, BS active,nonpalp mass or hsm.   Rectal:  Deferred  Msk:  Symmetrical without gross deformities. Pulses:  Normal pulses noted. Extremities:  Without clubbing or edema. Neurologic:  Alert and  oriented x4;  grossly normal neurologically. Skin:  Intact without significant lesions or rashes. Psych:  Alert and cooperative. Normal mood and affect.  Studies/Results: DG Abd 1 View  Result Date: 03/25/2020 CLINICAL DATA:  Ingested foreign body EXAM: ABDOMEN - 1 VIEW COMPARISON:  None. FINDINGS: 3.0 x 0.8 cm metallic foreign body projects within the right hemiabdomen, in the region of the distal stomach or proximal duodenum. The bowel gas pattern is normal. No radio-opaque calculi or other significant radiographic abnormality are seen. Mild lower lumbar spondylosis. IMPRESSION: 3.0 x 0.8 cm metallic foreign body projects within the right hemiabdomen, likely in the region of the distal stomach or proximal duodenum. Electronically Signed   By: Davina Poke D.O.   On: 03/25/2020 13:01    IMPRESSION:  *Swallowed foreign body: Accidentally swallowed a small dental tool during dental procedure earlier today.  Measures 3 cm  x 0.8 cm in size.  Currently in the distal stomach/proximal duodenum.  PLAN: -We will await transfer to [facility] long ED at which time they will repeat abdominal x-ray.  If it is still within reach then we will plan for attempted removal.  The only issue is that after she swallowed the item she ate mashed potatoes around 11:00 AM. -If it is not reachable then will likely need observation as inpatient until she passes the object.   Laban Emperor. Zehr  03/25/2020, 1:41 PM     Attending physician's note   I have taken a history, examined the patient and reviewed the chart. I agree with the Advanced Practitioner's note, impression and recommendations.   69 year old female accidentally swallowed one of the dental instruments at dentist this morning.  3 X 0.8 cm in dimension, one at the tip appears to be pointed, push pin appearance. On serial x-ray, appears to be in similar position, distal stomach or proximal duodenum Patient ate mashed potatoes around 11 AM-noon Plan to proceed with EGD with MAC to retrieve the foreign body The risks and benefits as well as alternatives of endoscopic procedure(s) have been discussed and reviewed. All questions answered. The patient agrees to proceed.   Damaris Hippo , MD [phone]

## 2020-03-26 ENCOUNTER — Encounter: Payer: Self-pay | Admitting: *Deleted

## 2020-03-27 NOTE — Anesthesia Postprocedure Evaluation (Signed)
Anesthesia Post Note  Patient: Stacy Duncan  Procedure(s) Performed: ESOPHAGOGASTRODUODENOSCOPY (EGD) WITH PROPOFOL (N/A ) FOREIGN BODY REMOVAL     Patient location during evaluation: PACU Anesthesia Type: General Level of consciousness: awake and alert Pain management: pain level controlled Vital Signs Assessment: post-procedure vital signs reviewed and stable Respiratory status: spontaneous breathing, nonlabored ventilation, respiratory function stable and patient connected to nasal cannula oxygen Cardiovascular status: blood pressure returned to baseline and stable Postop Assessment: no apparent nausea or vomiting Anesthetic complications: no    Last Vitals:  Vitals:   03/25/20 1740 03/25/20 1900  BP: (!) 163/80 (!) 138/58  Pulse: 72 74  Resp: 16 12  Temp:    SpO2: 97% 94%    Last Pain:  Vitals:   03/25/20 1957  TempSrc:   PainSc: 0-No pain                 Emanuel Campos S

## 2020-04-10 ENCOUNTER — Other Ambulatory Visit: Payer: Self-pay | Admitting: Gastroenterology

## 2020-09-03 DIAGNOSIS — K449 Diaphragmatic hernia without obstruction or gangrene: Secondary | ICD-10-CM | POA: Insufficient documentation

## 2021-03-20 ENCOUNTER — Other Ambulatory Visit: Payer: Self-pay | Admitting: Internal Medicine

## 2021-06-02 ENCOUNTER — Other Ambulatory Visit: Payer: Self-pay

## 2021-06-02 ENCOUNTER — Encounter (HOSPITAL_BASED_OUTPATIENT_CLINIC_OR_DEPARTMENT_OTHER): Payer: Self-pay | Admitting: Emergency Medicine

## 2021-06-02 ENCOUNTER — Emergency Department (HOSPITAL_BASED_OUTPATIENT_CLINIC_OR_DEPARTMENT_OTHER)
Admission: EM | Admit: 2021-06-02 | Discharge: 2021-06-02 | Disposition: A | Discharge status: Home / Self Care | Payer: Medicare Other | Attending: Emergency Medicine | Admitting: Emergency Medicine

## 2021-06-02 DIAGNOSIS — R197 Diarrhea, unspecified: Secondary | ICD-10-CM

## 2021-06-02 DIAGNOSIS — Z96653 Presence of artificial knee joint, bilateral: Secondary | ICD-10-CM | POA: Diagnosis not present

## 2021-06-02 DIAGNOSIS — F17210 Nicotine dependence, cigarettes, uncomplicated: Secondary | ICD-10-CM | POA: Diagnosis not present

## 2021-06-02 DIAGNOSIS — I10 Essential (primary) hypertension: Secondary | ICD-10-CM | POA: Diagnosis not present

## 2021-06-02 DIAGNOSIS — E860 Dehydration: Secondary | ICD-10-CM

## 2021-06-02 DIAGNOSIS — Z7982 Long term (current) use of aspirin: Secondary | ICD-10-CM | POA: Diagnosis not present

## 2021-06-02 LAB — URINALYSIS, ROUTINE W REFLEX MICROSCOPIC
Bilirubin Urine: NEGATIVE
Glucose, UA: NEGATIVE mg/dL
Ketones, ur: 40 mg/dL — AB
Nitrite: NEGATIVE
Protein, ur: NEGATIVE mg/dL
Specific Gravity, Urine: 1.01 (ref 1.005–1.030)
pH: 7.5 (ref 5.0–8.0)

## 2021-06-02 LAB — COMPREHENSIVE METABOLIC PANEL
ALT: 38 U/L (ref 0–44)
AST: 36 U/L (ref 15–41)
Albumin: 4.8 g/dL (ref 3.5–5.0)
Alkaline Phosphatase: 66 U/L (ref 38–126)
Anion gap: 14 (ref 5–15)
BUN: 22 mg/dL (ref 8–23)
CO2: 23 mmol/L (ref 22–32)
Calcium: 9.7 mg/dL (ref 8.9–10.3)
Chloride: 99 mmol/L (ref 98–111)
Creatinine, Ser: 1.22 mg/dL — ABNORMAL HIGH (ref 0.44–1.00)
GFR, Estimated: 48 mL/min — ABNORMAL LOW (ref >60)
Glucose, Bld: 110 mg/dL — ABNORMAL HIGH (ref 70–99)
Potassium: 3.4 mmol/L — ABNORMAL LOW (ref 3.5–5.1)
Sodium: 136 mmol/L (ref 135–145)
Total Bilirubin: 1.2 mg/dL (ref 0.3–1.2)
Total Protein: 8.5 g/dL — ABNORMAL HIGH (ref 6.5–8.1)

## 2021-06-02 LAB — C DIFFICILE QUICK SCREEN W PCR REFLEX
C Diff antigen: NEGATIVE
C Diff interpretation: NOT DETECTED
C Diff toxin: NEGATIVE

## 2021-06-02 LAB — CBC
HCT: 43 % (ref 36.0–46.0)
Hemoglobin: 15 g/dL (ref 12.0–15.0)
MCH: 29 pg (ref 26.0–34.0)
MCHC: 34.9 g/dL (ref 30.0–36.0)
MCV: 83.2 fL (ref 80.0–100.0)
Platelets: 374 10*3/uL (ref 150–400)
RBC: 5.17 MIL/uL — ABNORMAL HIGH (ref 3.87–5.11)
RDW: 13.8 % (ref 11.5–15.5)
WBC: 11.9 10*3/uL — ABNORMAL HIGH (ref 4.0–10.5)
nRBC: 0 % (ref 0.0–0.2)

## 2021-06-02 LAB — URINALYSIS, MICROSCOPIC (REFLEX): WBC, UA: NONE SEEN WBC/hpf (ref 0–5)

## 2021-06-02 MED ORDER — LACTATED RINGERS IV BOLUS
1000.0000 mL | Freq: Once | INTRAVENOUS | Status: AC
  Administered 2021-06-02: 1000 mL via INTRAVENOUS

## 2021-06-02 NOTE — ED Triage Notes (Signed)
Pt sent by urgent care due to dehydration. Pt reports diarrhea x 2 weeks. Denies recent antibiotic use. Denies pain.

## 2021-06-02 NOTE — Discharge Instructions (Addendum)
Follow-up with your primary care doctor regarding results of stool studies.  Continue to drink lots of fluids at home to replace fluid losses from diarrhea.  Return to the ED for any worsening symptoms.

## 2021-06-02 NOTE — ED Provider Notes (Signed)
[facility] EMERGENCY DEPARTMENT Provider Note   CSN: [id number] Arrival date & time: 06/02/21  0947     History Chief Complaint  Patient presents with   Diarrhea    Stacy Duncan is a 70 y.o. female.   Diarrhea Associated symptoms: no abdominal pain, no arthralgias, no chills, no fever, no headaches and no vomiting   Patient presents for concern of dehydration.  She has had ongoing watery diarrhea over the past 2 weeks.  She states that she is often using the bathroom every 1-2 hours.  She has not had any abdominal pain, nausea, or vomiting.  She has continued to be able to tolerate p.o. intake.  She has been attempting to drink plenty of fluids but is concerned that she has not been drinking enough.  She has had some intermittent episodes of lightheadedness upon standing.  Prior to her arrival in the ED, she was seen at urgent care.  She states that they did blood work at the urgent care and gave her a cup for stool sample, to be obtained at home.  They were not able to provide IV fluids and so was advised her to come to the ED for IV fluids.  Patient denies any recent fevers or chills.  She denies any significant history of diarrhea prior to the past 2 weeks.  She has not had any recent antibiotic use.  She denies any presence of blood in her stool.    Past Medical History:  Diagnosis Date   Cataract    Depression    Heart murmur    Hypercholesteremia    Hypertension     Patient Active Problem List   Diagnosis Date Noted   Foreign body in stomach    Swallowed foreign body    Primary localized osteoarthritis of left knee 08/07/2019   Primary osteoarthritis of left knee 08/07/2019   Primary localized osteoarthritis of right knee 11/21/2018   Primary osteoarthritis of right knee 11/21/2018   Personal history of colonic adenoma 08/08/2013   Family history of colon cancer - elderly mother 08/08/2013    Past Surgical History:  Procedure Laterality Date   ANKLE SURGERY  Right    BREAST LUMPECTOMY Left    mid 90s   COLONOSCOPY  07/31/2013   ESOPHAGOGASTRODUODENOSCOPY (EGD) WITH PROPOFOL N/A 03/25/2020   Procedure: ESOPHAGOGASTRODUODENOSCOPY (EGD) WITH PROPOFOL;  Surgeon: Mauri Pole, MD;  Location: [facility];  Service: Endoscopy;  Laterality: N/A;   FOREIGN BODY REMOVAL  03/25/2020   Procedure: FOREIGN BODY REMOVAL;  Surgeon: Mauri Pole, MD;  Location: [facility];  Service: Endoscopy;;   KNEE SURGERY Left    POLYPECTOMY     TOTAL KNEE ARTHROPLASTY Right 11/21/2018   Procedure: TOTAL KNEE ARTHROPLASTY;  Surgeon: Melrose Nakayama, MD;  Location: [facility];  Service: Orthopedics;  Laterality: Right;   TOTAL KNEE ARTHROPLASTY Left 08/07/2019   Procedure: LEFT TOTAL KNEE ARTHROPLASTY;  Surgeon: Melrose Nakayama, MD;  Location: [facility];  Service: Orthopedics;  Laterality: Left;     OB History   No obstetric history on file.     Family History  Problem Relation Age of Onset   Colon cancer Mother 83   Colon polyps Neg Hx    Esophageal cancer Neg Hx    Rectal cancer Neg Hx    Stomach cancer Neg Hx     Social History   Tobacco Use   Smoking status: Every Day    Packs/day: 0.50    Types: Cigarettes  Last attempt to quit: 10/25/2006    Years since quitting: 14.6   Smokeless tobacco: Never  Vaping Use   Vaping Use: Never used  Substance Use Topics   Alcohol use: Yes    Alcohol/week: 2.0 standard drinks    Types: 2 Glasses of wine per week    Comment: per week   Drug use: No    Home Medications Prior to Admission medications   Medication Sig Start Date End Date Taking? Authorizing Provider  acetaminophen (TYLENOL) 500 MG tablet Take 500 mg by mouth every 6 (six) hours as needed for moderate pain.    [provider]  aspirin EC 81 MG tablet Take 1 tablet (81 mg total) by mouth 2 (two) times daily after a meal. 08/08/19   Loni Dolly, PA-C  chlorthalidone (HYGROTON) 25 MG tablet Take 25 mg by mouth daily.  05/31/17   [provider]  escitalopram (LEXAPRO) 10 MG tablet Take 10 mg by mouth daily.  05/31/17   [provider]  mometasone (NASONEX) 50 MCG/ACT nasal spray Place 1 spray into the nose daily as needed (allergies.).  06/20/18   [provider]  Multiple Vitamin (MULTIVITAMIN WITH MINERALS) TABS tablet Take 1 tablet by mouth daily.    [provider]  pantoprazole (PROTONIX) 40 MG tablet TAKE 1 TABLET BY MOUTH EVERY DAY 03/20/21   Gatha Mayer, MD  rosuvastatin (CRESTOR) 20 MG tablet Take 20 mg by mouth daily.  05/31/17 07/25/20  [provider]  Vitamin D, Ergocalciferol, (DRISDOL) 1.25 MG (50000 UT) CAPS capsule Take 50,000 Units by mouth every Sunday.  06/20/18   [provider]    Allergies    Patient has no known allergies.  Review of Systems   Review of Systems  Constitutional:  Negative for activity change, appetite change, chills, fatigue and fever.  HENT:  Negative for ear pain and sore throat.   Eyes:  Negative for pain and visual disturbance.  Respiratory:  Negative for cough and shortness of breath.   Cardiovascular:  Negative for chest pain and palpitations.  Gastrointestinal:  Positive for diarrhea. Negative for abdominal distention, abdominal pain, blood in stool and vomiting.  Genitourinary:  Negative for dysuria, flank pain, frequency, hematuria and pelvic pain.  Musculoskeletal:  Negative for arthralgias and back pain.  Skin:  Negative for color change and rash.  Neurological:  Positive for light-headedness. Negative for dizziness, seizures, syncope, weakness, numbness and headaches.  All other systems reviewed and are negative.  Physical Exam Updated Vital Signs BP (!) 137/91 (BP Location: Right Arm)   Pulse 88   Temp 98.3 F (36.8 C) (Oral)   Resp 18   Ht '5\' 6"'$  (1.676 m)   Wt 93.4 kg   SpO2 99%   BMI 33.25 kg/m   Physical Exam Vitals and nursing note reviewed.  Constitutional:      General: She is not in acute distress.     Appearance: Normal appearance. She is well-developed. She is not ill-appearing, toxic-appearing or diaphoretic.  HENT:     Head: Normocephalic and atraumatic.     Right Ear: External ear normal.     Left Ear: External ear normal.     Nose: Nose normal.     Mouth/Throat:     Mouth: Mucous membranes are moist.     Pharynx: Oropharynx is clear.  Eyes:     Conjunctiva/sclera: Conjunctivae normal.  Cardiovascular:     Rate and Rhythm: Normal rate and regular rhythm.  Heart sounds: No murmur heard. Pulmonary:     Effort: Pulmonary effort is normal. No respiratory distress.     Breath sounds: Normal breath sounds.  Abdominal:     Palpations: Abdomen is soft.     Tenderness: There is no abdominal tenderness. There is no right CVA tenderness, left CVA tenderness or guarding.  Musculoskeletal:     Cervical back: Neck supple.     Right lower leg: No edema.     Left lower leg: No edema.  Skin:    General: Skin is warm and dry.     Coloration: Skin is not pale.  Neurological:     General: No focal deficit present.     Mental Status: She is alert and oriented to person, place, and time.     Cranial Nerves: No cranial nerve deficit.     Sensory: No sensory deficit.     Motor: No weakness.  Psychiatric:        Mood and Affect: Mood normal.        Behavior: Behavior normal.        Thought Content: Thought content normal.        Judgment: Judgment normal.    ED Results / Procedures / Treatments   Labs (all labs ordered are listed, but only abnormal results are displayed) Labs Reviewed  CBC - Abnormal; Notable for the following components:      Result Value   WBC 11.9 (*)    RBC 5.17 (*)    All other components within normal limits  COMPREHENSIVE METABOLIC PANEL - Abnormal; Notable for the following components:   Potassium 3.4 (*)    Glucose, Bld 110 (*)    Creatinine, Ser 1.22 (*)    Total Protein 8.5 (*)    GFR, Estimated 48 (*)    All other components within normal limits   URINALYSIS, ROUTINE W REFLEX MICROSCOPIC - Abnormal; Notable for the following components:   APPearance CLOUDY (*)    Hgb urine dipstick LARGE (*)    Ketones, ur 40 (*)    Leukocytes,Ua LARGE (*)    All other components within normal limits  URINALYSIS, MICROSCOPIC (REFLEX) - Abnormal; Notable for the following components:   Bacteria, UA FEW (*)    All other components within normal limits  C DIFFICILE QUICK SCREEN W PCR REFLEX    GASTROINTESTINAL PANEL BY PCR, STOOL (REPLACES STOOL CULTURE)    EKG None  Radiology No results found.  Procedures Procedures   Medications Ordered in ED Medications  lactated ringers bolus 1,000 mL (0 mLs Intravenous Stopped 06/02/21 1452)    ED Course  I have reviewed the triage vital signs and the nursing notes.  Pertinent labs & imaging results that were available during my care of the patient were reviewed by me and considered in my medical decision making (see chart for details).    MDM Rules/Calculators/A&P                           Patient is a 70 year old female who presents for persistent watery diarrhea over the past 2 weeks and concern of dehydration.  She was seen in urgent care prior to arrival.  She states that they did lab work at that time.  Lab work not available in [place].  Will obtain blood work here to assess for electrolyte abnormalities, given her persistent diarrhea.  Patient was also encouraged to provide stool sample which can be sent for testing.  Patient is afebrile upon arrival.  Vital signs notable for hypertension.  Patient is well-appearing.  Abdomen is soft and nontender.  1 L of IV fluids ordered for rehydration.  Lab work showed creatinine of 1.22.  This is only slightly elevated from most recent lab work from a year ago.  Electrolytes notable for low-normal potassium.  Following IVF bolus, patient was able to ambulate without any symptoms of lightheadedness.  She was able to provide stool sample in the ED.  She was  encouraged to continue p.o. intake, including foods and plenty of fluids.  She was advised to follow-up with a primary care doctor regarding results of stool studies.  She is also encouraged to return to the ED for any new or worsening symptoms.  Patient was discharged in good condition.  Final Clinical Impression(s) / ED Diagnoses Final diagnoses:  Diarrhea, unspecified type  Dehydration    Rx / DC Orders ED Discharge Orders     None        Godfrey Pick, MD 06/03/21 [phone]

## 2021-06-05 LAB — GASTROINTESTINAL PANEL BY PCR, STOOL (REPLACES STOOL CULTURE)

## 2021-06-21 NOTE — Progress Notes (Signed)
[id number]    06/21/2021 Stacy Duncan [id number] January 15, 1951   Chief Complaint:  Hemorrhoids, constipation   History of Present Illness: Stacy Duncan is a 70 year old female with a past medical history of  depression, hypertension, hypercholesterolemia, carotid artery stenosis, GERD and colon polyps.   She presented to [facility] ED on 06/02/2021 due to having watery diarrhea x 2 weeks with dehydration. No associated abdominal pain, nausea or vomiting. Labs in the ED showed WBC 11.9. Cr 1.22. Urine ketones large, urine leukocytes large.  Urine culture was not done and she was not treated for a UTI.  C. Diff antigen and toxin negative. GI pathogen panel was negative. She received 1L IV fluids and she was discharged home with the instructions to follow up with her primary care physician.   She presents to our office today for further evaluation regarding diarrhea.  She continues to have diarrhea daily.  She initially had diarrhea every hour 10 times daily.  She described passing small quantities of brown watery diarrhea with increased urgency at times.  He has passed 3 episodes of watery diarrhea so far this morning.  No rectal bleeding.  She had intermittent mid right and left abdominal pain, no severe abdominal pain.  She took a few doses of Imodium and Pepto-Bismol without improvement but did not take these medications on a consistent basis.  She last took Amoxicillin 4 tabs approximately 1 month ago prior to a dental exam.  No new medications within the past 6 months.  After further questioning, she actually noticed a change in her bowel pattern may be 3 months ago when she started passing thicker mud like stools when she previously passed a normal formed solid stool most days.  She has intermittent low back pain which has been more noticeable for the past 2 months.  She complains of having rectal pain for the past 3 weeks, sometimes hurts to sit.  No rectal bleeding.  She underwent an EGD  03/25/2020 which evidence of a dental foreign body which was removed and showed grade C reflux esophagitis for which she was prescribed pantoprazole 40 mg daily.  She stopped taking the Pantoprazole 1 month ago to her prescription ran out.  Her most recent colonoscopy was 05/30/2019 which identified 2 small tubular adenomatous polyps which were removed from the transverse colon, no evidence of colitis.  Mother with history of colon cancer.  CBC Latest Ref Rng & Units 06/02/2021 03/25/2020 07/31/2019  WBC 4.0 - 10.5 K/uL 11.9(H) 9.1 9.7  Hemoglobin 12.0 - 15.0 g/dL 15.0 14.6 15.2(H)  Hematocrit 36.0 - 46.0 % 43.0 42.5 46.8(H)  Platelets 150 - 400 K/uL 374 271 285    CMP Latest Ref Rng & Units 06/02/2021 03/25/2020 07/31/2019  Glucose 70 - 99 mg/dL 110(H) 124(H) 102(H)  BUN 8 - 23 mg/dL '22 14 18  '$ Creatinine 0.44 - 1.00 mg/dL 1.22(H) 1.09(H) 0.74  Sodium 135 - 145 mmol/L 136 140 139  Potassium 3.5 - 5.1 mmol/L 3.4(L) 3.2(L) 3.9  Chloride 98 - 111 mmol/L 99 103 102  CO2 22 - 32 mmol/L '23 26 26  '$ Calcium 8.9 - 10.3 mg/dL 9.7 9.0 9.2  Total Protein 6.5 - 8.1 g/dL 8.5(H) - -  Total Bilirubin 0.3 - 1.2 mg/dL 1.2 - -  Alkaline Phos 38 - 126 U/L 66 - -  AST 15 - 41 U/L 36 - -  ALT 0 - 44 U/L 38 - -    EGD 03/25/2020 by Dr. Silverio Decamp: - LA  Grade C reflux esophagitis. - LA Grade C reflux esophagitis. Outpatient - Large hiatal hernia. - Gastritis. - Dental instrument was found in the prepyloric region of the stomach. Removal was successful. - Normal examined duodenum. - Prescribed Pantoprazole '40mg'$  QD  Colonoscopy 05/30/2019 by Dr. Carlean Purl  - Two diminutive polyps in the transverse colon, removed with a cold snare. Resected and retrieved. - The examination was otherwise normal on direct and retroflexion views. - Personal history of colonic polyp diminiutive adenoma 2014 - also elderly mom had colon cancer. - Recall colonoscopy 05/2024 Biopsy result: - TUBULAR ADENOMA (X2 FRAGMENTS). - NO HIGH GRADE  DYSPLASIA OR MALIGNANCY  Past Medical History:  Diagnosis Date   Cataract    Depression    Heart murmur    Hypercholesteremia    Hypertension    Past Surgical History:  Procedure Laterality Date   ANKLE SURGERY Right    BREAST LUMPECTOMY Left    mid 51s   COLONOSCOPY  07/31/2013   ESOPHAGOGASTRODUODENOSCOPY (EGD) WITH PROPOFOL N/A 03/25/2020   Procedure: ESOPHAGOGASTRODUODENOSCOPY (EGD) WITH PROPOFOL;  Surgeon: Mauri Pole, MD;  [facility];  Service: Endoscopy;  Laterality: N/A;   FOREIGN BODY REMOVAL  03/25/2020   Procedure: FOREIGN BODY REMOVAL;  Surgeon: Mauri Pole, MD;  Location: [facility];  Service: Endoscopy;;   KNEE SURGERY Left    POLYPECTOMY     TOTAL KNEE ARTHROPLASTY Right 11/21/2018   Procedure: TOTAL KNEE ARTHROPLASTY;  Surgeon: Melrose Nakayama, MD;  Location: [facility];  Service: Orthopedics;  Laterality: Right;   TOTAL KNEE ARTHROPLASTY Left 08/07/2019   Procedure: LEFT TOTAL KNEE ARTHROPLASTY;  Surgeon: Melrose Nakayama, MD;  Location: [facility];  Service: Orthopedics;  Laterality: Left;     Current Medications, Allergies, Past Medical History, Past Surgical History, Family History and Social History were reviewed in [employer] record.   Review of Systems:   Constitutional: Negative for fever, sweats, chills or weight loss.  Respiratory: Negative for shortness of breath.   Cardiovascular: Negative for chest pain, palpitations and leg swelling.  Gastrointestinal: See HPI.  Musculoskeletal: Negative for back pain or muscle aches.  Neurological: Negative for dizziness, headaches or paresthesias.    Physical Exam: There were no vitals taken for this visit. General: 70 year old female in no acute distress. Head: Normocephalic and atraumatic. Eyes: No scleral icterus. Conjunctiva pink . Ears: Normal auditory acuity. Mouth: Dentition intact. No ulcers or lesions.  Lungs: Clear throughout to auscultation. Heart:  Regular rate and rhythm, no murmur. Abdomen: Soft, nontender and nondistended. No masses or hepatomegaly. Normal bowel sounds x 4 quadrants.  Rectal: Anal hemorrhoids without significant prolapse. Solid formed brown stool filled the rectal vault, the stool was mobile. Melissa CMA present during exam.  Musculoskeletal: Symmetrical with no gross deformities. Extremities: No edema. Neurological: Alert oriented x 4. No focal deficits.  Psychological: Alert and cooperative. Normal mood and affect  Assessment and Recommendations:  1) Suspect overflow diarrhea with a large amount of solid stool in the rectum on exam today -Fleet enema this afternoon, may also use a water enema -Dulcolax suppository tonight -Abdominal xray now to assess extent of constipation/obstipation  -CBC, BMP, TSH -Further recommendations to be provided after abdominal xray reviewed  -Hold Imodium/Pepto for now -Push fluids   2) History of tubular adenomatous colon polyps. Mother with history of colon cancer.  -Next colonoscopy due 05/2024  3) GERD -Restart Pantoprazole '40mg'$  QD

## 2021-06-22 ENCOUNTER — Other Ambulatory Visit: Payer: Self-pay

## 2021-06-22 ENCOUNTER — Ambulatory Visit (INDEPENDENT_AMBULATORY_CARE_PROVIDER_SITE_OTHER): Payer: Medicare Other | Admitting: Nurse Practitioner

## 2021-06-22 ENCOUNTER — Other Ambulatory Visit (INDEPENDENT_AMBULATORY_CARE_PROVIDER_SITE_OTHER): Payer: Medicare Other

## 2021-06-22 ENCOUNTER — Encounter: Payer: Self-pay | Admitting: Nurse Practitioner

## 2021-06-22 ENCOUNTER — Ambulatory Visit (INDEPENDENT_AMBULATORY_CARE_PROVIDER_SITE_OTHER)
Admission: RE | Admit: 2021-06-22 | Discharge: 2021-06-22 | Disposition: A | Discharge status: Home / Self Care | Payer: Medicare Other | Source: Ambulatory Visit | Attending: Nurse Practitioner | Admitting: Nurse Practitioner

## 2021-06-22 VITALS — BP 136/70 | HR 100 | Ht 66.0 in | Wt 200.5 lb

## 2021-06-22 DIAGNOSIS — K5900 Constipation, unspecified: Secondary | ICD-10-CM | POA: Diagnosis not present

## 2021-06-22 DIAGNOSIS — K644 Residual hemorrhoidal skin tags: Secondary | ICD-10-CM | POA: Diagnosis not present

## 2021-06-22 DIAGNOSIS — K6289 Other specified diseases of anus and rectum: Secondary | ICD-10-CM

## 2021-06-22 DIAGNOSIS — R197 Diarrhea, unspecified: Secondary | ICD-10-CM | POA: Diagnosis not present

## 2021-06-22 LAB — CBC WITH DIFFERENTIAL/PLATELET
Basophils Absolute: 0.1 10*3/uL (ref 0.0–0.1)
Basophils Relative: 0.7 % (ref 0.0–3.0)
Eosinophils Absolute: 0.2 10*3/uL (ref 0.0–0.7)
Eosinophils Relative: 1.8 % (ref 0.0–5.0)
HCT: 43.2 % (ref 36.0–46.0)
Hemoglobin: 14.6 g/dL (ref 12.0–15.0)
Lymphocytes Relative: 38.8 % (ref 12.0–46.0)
Lymphs Abs: 3.4 10*3/uL (ref 0.7–4.0)
MCHC: 33.8 g/dL (ref 30.0–36.0)
MCV: 84.6 fl (ref 78.0–100.0)
Monocytes Absolute: 0.6 10*3/uL (ref 0.1–1.0)
Monocytes Relative: 6.6 % (ref 3.0–12.0)
Neutro Abs: 4.5 10*3/uL (ref 1.4–7.7)
Neutrophils Relative %: 52.1 % (ref 43.0–77.0)
Platelets: 324 10*3/uL (ref 150.0–400.0)
RBC: 5.11 Mil/uL (ref 3.87–5.11)
RDW: 14.4 % (ref 11.5–15.5)
WBC: 8.7 10*3/uL (ref 4.0–10.5)

## 2021-06-22 LAB — TSH: TSH: 0.88 u[IU]/mL (ref 0.35–5.50)

## 2021-06-22 LAB — BASIC METABOLIC PANEL
BUN: 16 mg/dL (ref 6–23)
CO2: 27 mEq/L (ref 19–32)
Calcium: 10.1 mg/dL (ref 8.4–10.5)
Chloride: 100 mEq/L (ref 96–112)
Creatinine, Ser: 1 mg/dL (ref 0.40–1.20)
GFR: 56.98 mL/min — ABNORMAL LOW (ref >60.00)
Glucose, Bld: 95 mg/dL (ref 70–99)
Potassium: 3.4 mEq/L — ABNORMAL LOW (ref 3.5–5.1)
Sodium: 139 mEq/L (ref 135–145)

## 2021-06-22 MED ORDER — PANTOPRAZOLE SODIUM 40 MG PO TBEC: 40.0000 mg | DELAYED_RELEASE_TABLET | Freq: Every day | ORAL | 1 refills | Status: DC

## 2021-06-22 NOTE — Patient Instructions (Signed)
LABS:  Lab work has been ordered for you today. Our lab is located in the basement. Press "B" on the elevator. The lab is located at the first door on the left as you exit the elevator.  HEALTHCARE LAWS AND MY CHART RESULTS: Due to recent changes in healthcare laws, you may see the results of your imaging and laboratory studies on MyChart before your provider has had a chance to review them.   We understand that in some cases there may be results that are confusing or concerning to you. Not all laboratory results come back in the same time frame and the provider may be waiting for multiple results in order to interpret others.  Please give [place] 48 hours in order for your provider to thoroughly review all the results before contacting the office for clarification of your results.   IMAGING Your provider has requested that you have an abdominal x ray before leaving today. Please go to the basement floor to our Radiology department for the test.  RECOMMENDATIONS: Fleet enema, use once. Dulcolax suppository (over the counter) use one at bedtime tonight.  Further instructions to be determined after x-ray and lab results reviewed.  It was great seeing you today! Thank you for entrusting me with your care and choosing [facility].  Noralyn Pick, CRNP  The [facility] providers would like to encourage you to use [facility] to communicate with providers for non-urgent requests or questions.  Due to long hold times on the telephone, sending your provider a message by [facility] may be faster and more efficient way to get a response. Please allow 48 business hours for a response.  Please remember that this is for non-urgent requests/questions.  If you are age 22 or older, your body mass index should be between 23-30. Your Body mass index is 32.36 kg/m. If this is out of the aforementioned range listed, please consider follow up with your Primary Care Provider.  If you are age 40 or  younger, your body mass index should be between 19-25. Your Body mass index is 32.36 kg/m. If this is out of the aformentioned range listed, please consider follow up with your Primary Care Provider.

## 2021-06-23 ENCOUNTER — Other Ambulatory Visit: Payer: Self-pay | Admitting: *Deleted

## 2021-06-23 DIAGNOSIS — R197 Diarrhea, unspecified: Secondary | ICD-10-CM

## 2021-06-25 ENCOUNTER — Telehealth: Payer: Self-pay | Admitting: Nurse Practitioner

## 2021-06-25 NOTE — Telephone Encounter (Signed)
Patient called still having diarrhea seeking further advise.

## 2021-06-25 NOTE — Telephone Encounter (Signed)
I called the patient, in review I saw her in office on Monday 8/29 and she had a moderate amount of stool in the rectum but not impacted. An abd xray did not show evidence of constipation/obstipation. She took Pepto liquid on Sun 8/28. She used a saline enema following her appointment and passed "particles of stool" but no significant amount of solid stool. Tuesday she didn't have any diarrhea and felt "stopped up" so she used a Dulcolax suppository without passing any significant amount of solid stool. Wed 8/31 she had similar diarrhea and today passed watery brown diarrhea Q 30 minutes x 4 to 5 episodes then took Pepto and diarrhea has slowed down. Diarrhea episodes are low volume, no excessive amounts of diarrhea. No abdominal pain or fever.   This is a challenging situation to sort through.  Lauren, please add the patient to my schedule tomorrow Friday 9/2 at 3:30pm but if there are any earlier cancellations please schedule patient earlier in the day.  Patient was informed I will repeat a rectal exam to see if she still has solid stool in the rectum or not.   I advised her to start taking a probiotic today.

## 2021-06-25 NOTE — Telephone Encounter (Signed)
Spoke patient. Patient states her diarrhea has return and she going to restroom every 92mn. Patient reports she took Doculax, fleet enema and Pepto Bismol. Patient says she started feeling better on Tuesday, but today diarrhea has returned. Please advise, thank you

## 2021-06-25 NOTE — Telephone Encounter (Signed)
Pt has been scheduled 9/2 at [place] with pt to ensure she was aware. Nothing further at this time

## 2021-06-26 ENCOUNTER — Encounter: Payer: Self-pay | Admitting: Nurse Practitioner

## 2021-06-26 ENCOUNTER — Ambulatory Visit (INDEPENDENT_AMBULATORY_CARE_PROVIDER_SITE_OTHER): Payer: Medicare Other | Admitting: Nurse Practitioner

## 2021-06-26 ENCOUNTER — Telehealth: Payer: Self-pay | Admitting: Nurse Practitioner

## 2021-06-26 VITALS — BP 128/70 | HR 96 | Ht 64.0 in | Wt 198.5 lb

## 2021-06-26 DIAGNOSIS — K5900 Constipation, unspecified: Secondary | ICD-10-CM | POA: Diagnosis not present

## 2021-06-26 DIAGNOSIS — R197 Diarrhea, unspecified: Secondary | ICD-10-CM

## 2021-06-26 NOTE — Telephone Encounter (Signed)
Stacy Duncan, Refer to my phone msg 06/25/2021. I requested for the patient to be scheduled today at 3:30pm for a repeat exam. Looks like she was scheduled 07/28/2021 at 2:30pm.  Please schedule her for an appointment today 9/2 at 3:30pm and call patient to make sure she is aware of this appointment time.  If we get any earlier cancellations in clinic today maybe we can work her in sooner.  THX

## 2021-06-26 NOTE — Telephone Encounter (Signed)
Spoke with patient this morning and let her know of the scheduling error and that we will be seeing her today at 3:30 pm, not on October 4th.  Patient expressed understanding and agreement. She will come in today at 3:30 pm to see [facility]. Appointment in October has been canceled.

## 2021-06-26 NOTE — Progress Notes (Signed)
06/26/2021 Randell Salais [id number] 1950/12/08   Chief Complaint: diarrhea/constipation   History of Present Illness:  Stacy Duncan is a 70 year old female with a past medical history of  depression, hypertension, hypercholesterolemia, carotid artery stenosis, GERD and colon polyps.   Refer to office visit 06/22/2021 for comprehensive history, seen at that time due to having diarrhea. On exam, I assessed she likely had constipation with overflow diarrhea as she had a rectum full of stool. She was instructed to use a Fleet enema or water enema and a Dulcolax suppository which she did without relief. An abdomina xray was done 8/29 which showed a normal gas pattern without evidence of severe constipation.   She was called for further follow with complaints of persistent rectal discomfort and passing frequent small amounts of watery brown diarrhea. No fever. No abdominal pain. No bloody diarrhea. She took one dose of Pepto bismol yesterday. She was advised to return the our GI clinic today for a repeat rectal exam.    Current Medications, Allergies, Past Medical History, Past Surgical History, Family History and Social History were reviewed in [employer] record.   Review of Systems:   Constitutional: Negative for fever, sweats, chills or weight loss.  Respiratory: Negative for shortness of breath.   Cardiovascular: Negative for chest pain, palpitations and leg swelling.  Gastrointestinal: See HPI.  Musculoskeletal: Negative for back pain or muscle aches.  Neurological: Negative for dizziness, headaches or paresthesias.    Physical Exam: BP 128/70 (BP Location: Left Arm, Patient Position: Sitting, Cuff Size: Normal)   Pulse 96   Ht '5\' 4"'$  (1.626 m) Comment: height measured without shoes  Wt 198 lb 8 oz (90 kg)   BMI 34.07 kg/m  General: 70 year old female in no acute distress. Lungs: Clear throughout to auscultation. Heart: Regular rate and rhythm, no  murmur. Abdomen: Soft, nontender and nondistended. No masses or hepatomegaly. Normal bowel sounds x 4 quadrants.  Rectal: External hemorrhoids without gross inflammation. Rectum filled with soft mobile brown stool. A moderate amount of formed stool was removed from the rectum, a fleet enema was administered and the patient sat on the commode for 10 to 15 minutes and passed a moderate amount of solid and loose stool with relief. Repeat rectal exam identified an empty rectum, no stool within reach. Sophia CMA present during rectal exam.  Musculoskeletal: Symmetrical with no gross deformities. Extremities: No edema. Neurological: Alert oriented x 4. No focal deficits.  Psychological: Alert and cooperative. Normal mood and affect  Assessment and Recommendations:  1) Constipation with overflow diarrhea. Prior GI pathogen panel and C. Diff testing negative. Rectal exam 8/29 showed a stool filled rectum. She took a saline enema and a Dulcolax suppository without relief, continued to pass frequent watery brown diarrhea stool. She took Pepto x 1 on 9/1. She passed 3 small volume watery diarrhea stools this morning. Rectal exam today showed mobile formed brown stool, partial manual disimpaction followed by a Fleet enema then patient passed some solid stool directly into the commode with relief. Abd xray did not show evidence of obstipation.  -Miralax 1 dose Q hr x 3 tonight  -Probiotic of choice  -No Pepto bismol  -Apply a small amount of Desitin inside the anal opening and to the external anal area tid as needed for anal or hemorrhoidal irritation/bleeding.  -To the ED if she develops severe abdominal or rectal pain or severe diarrhea  -Patient to call office on Tuesday after Labor  Day holiday for an update  -Patient as discharged home in stable condition   2) Prior AKI resolved, BUN 16. Cr. 1.0. K+ 3.4.   Today's encounter included 45 minutes including symptoms update, repeat rectal exam, administration  of enema in office with continued observation, discharge instructions and documentation.

## 2021-06-26 NOTE — Patient Instructions (Addendum)
RECOMMENDATIONS: I want you to use Miralax tonight 4 times, one hour apart. Mix 1 capful in 8 ounces of water or Gatorade and drink.  No Pepto Bismol  Go to emergency room if your symptoms worsen.  It was great seeing you today! Thank you for entrusting me with your care and choosing [facility].  Noralyn Pick, CRNP  The [facility] providers would like to encourage you to use [facility] to communicate with providers for non-urgent requests or questions.  Due to long hold times on the telephone, sending your provider a message by [facility] may be faster and more efficient way to get a response. Please allow 48 business hours for a response.  Please remember that this is for non-urgent requests/questions.  If you are age 20 or older, your body mass index should be between 23-30. Your Body mass index is 34.07 kg/m. If this is out of the aforementioned range listed, please consider follow up with your Primary Care Provider.  If you are age 57 or younger, your body mass index should be between 19-25. Your Body mass index is 34.07 kg/m. If this is out of the aformentioned range listed, please consider follow up with your Primary Care Provider.

## 2021-07-28 ENCOUNTER — Ambulatory Visit: Payer: Medicare Other | Admitting: Nurse Practitioner

## 2021-09-15 ENCOUNTER — Other Ambulatory Visit: Payer: Self-pay | Admitting: Nurse Practitioner

## 2022-01-27 ENCOUNTER — Other Ambulatory Visit: Payer: Self-pay | Admitting: Nurse Practitioner

## 2022-04-09 IMAGING — DX DG ABDOMEN 1V
2 series · 2 of 2 positions shown · non-contrast
Comparison: March 25, 2020

CLINICAL DATA: Constipation x1 month.

EXAM:
ABDOMEN - 1 VIEW

[abdomen kub (1 of 2)]
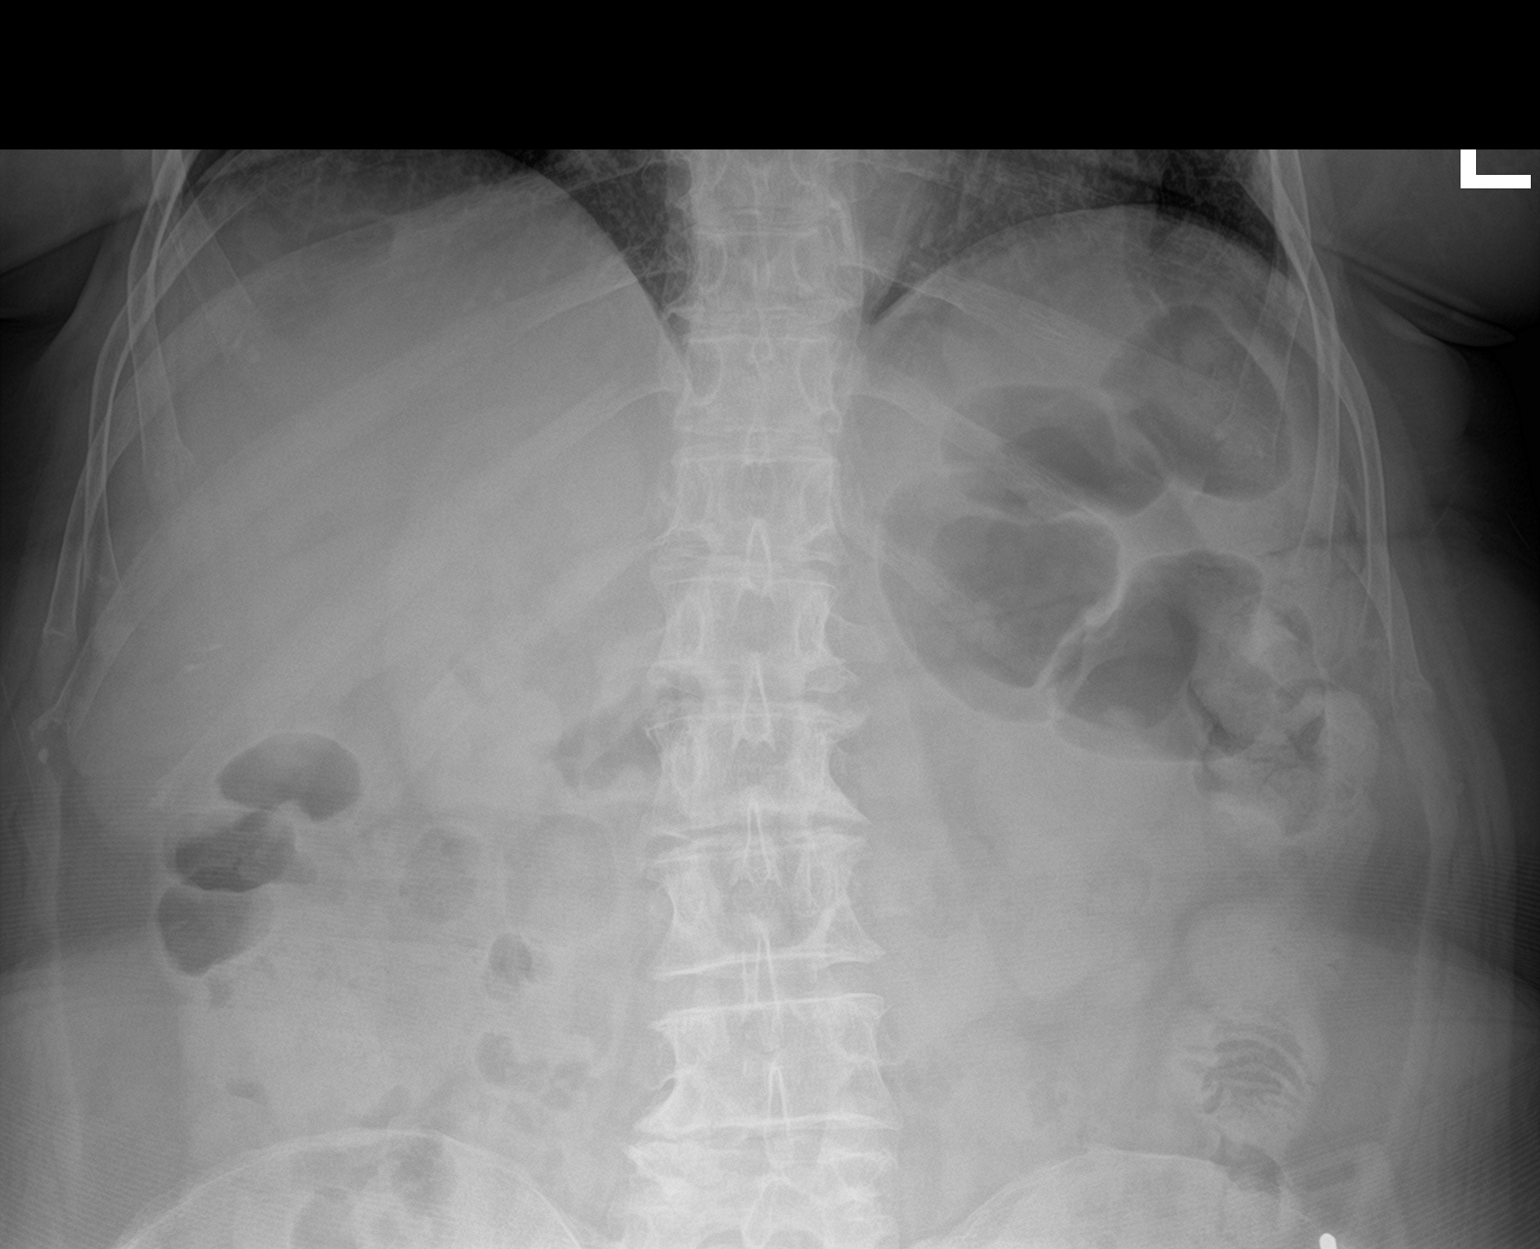

[abdomen kub (2 of 2)]
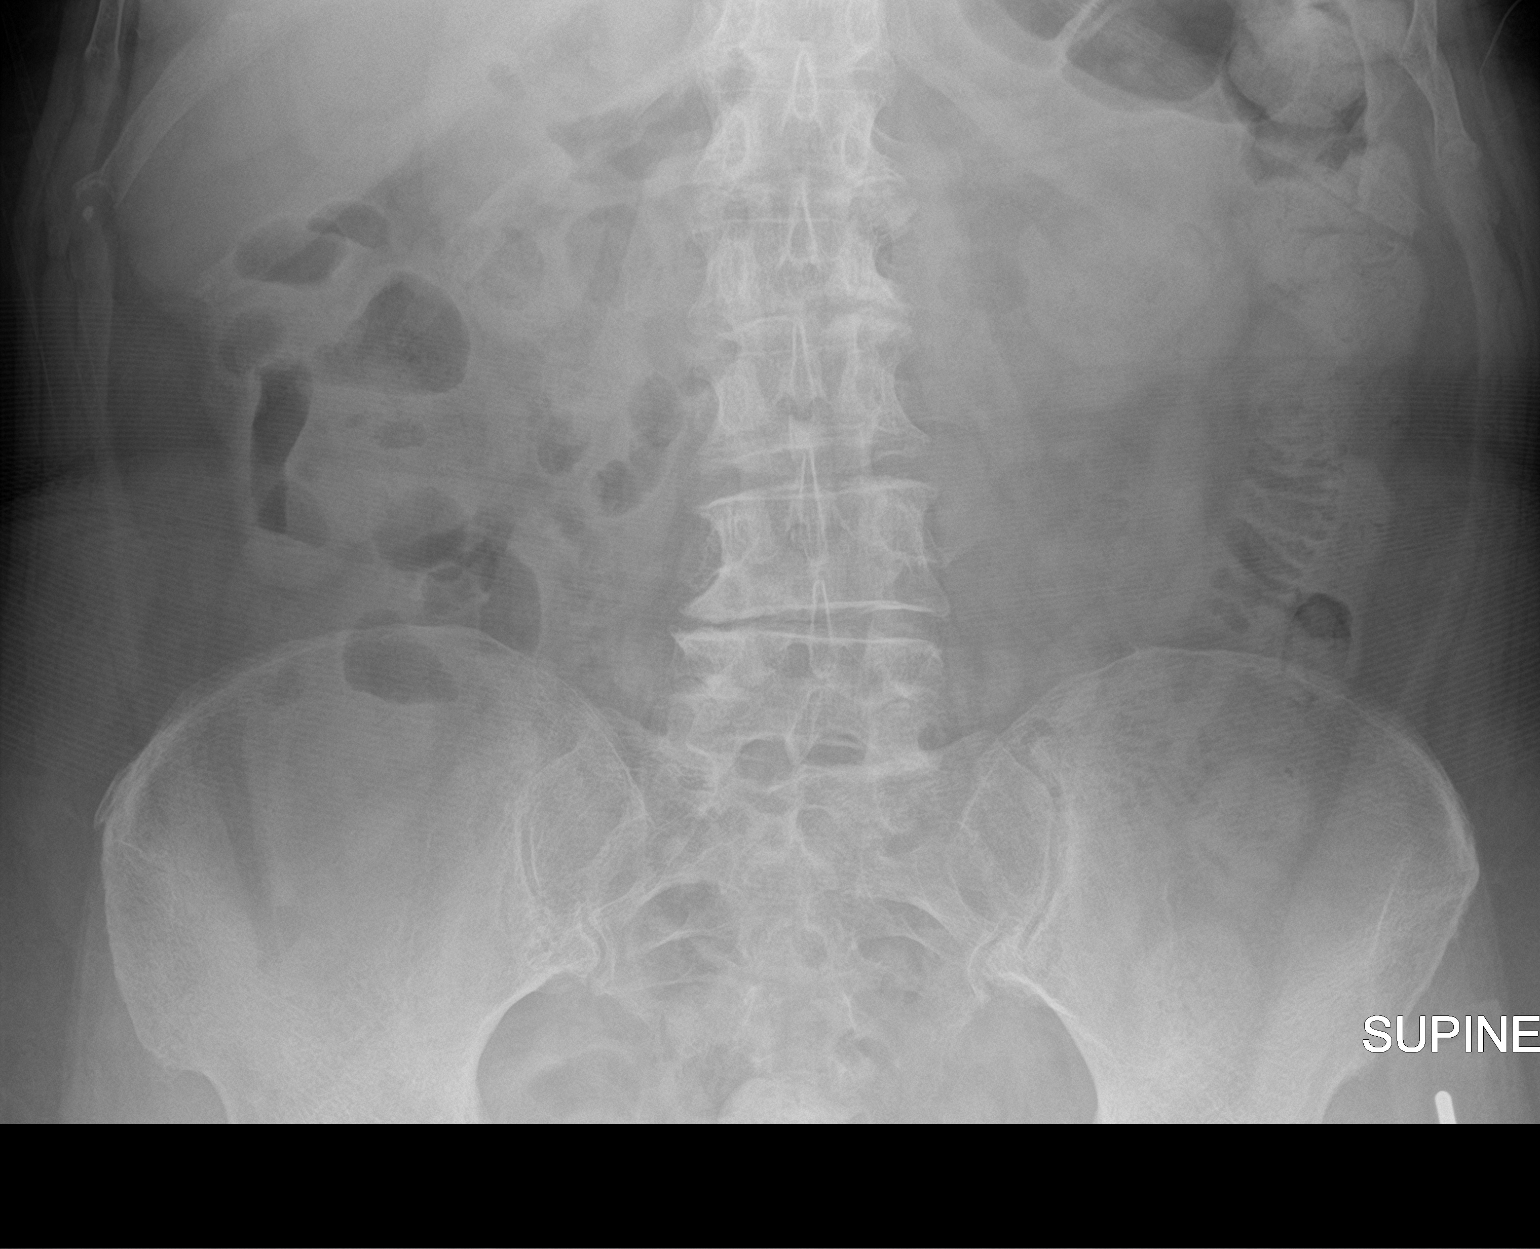

[2 of 2 positions shown; findings below may reference images not displayed]

FINDINGS: The bowel gas pattern is normal. No radio-opaque calculi or other
significant radiographic abnormality are seen.
IMPRESSION: Negative.

## 2022-08-31 ENCOUNTER — Other Ambulatory Visit: Payer: Self-pay

## 2022-08-31 ENCOUNTER — Other Ambulatory Visit: Payer: Self-pay | Admitting: Nurse Practitioner

## 2022-08-31 MED ORDER — PANTOPRAZOLE SODIUM 40 MG PO TBEC: 40.0000 mg | DELAYED_RELEASE_TABLET | Freq: Every day | ORAL | 1 refills | Status: DC

## 2022-10-07 ENCOUNTER — Encounter: Payer: Self-pay | Admitting: Nurse Practitioner

## 2022-10-07 ENCOUNTER — Ambulatory Visit (INDEPENDENT_AMBULATORY_CARE_PROVIDER_SITE_OTHER): Payer: Medicare Other | Admitting: Nurse Practitioner

## 2022-10-07 VITALS — BP 110/68 | HR 89 | Ht 66.0 in | Wt 209.0 lb

## 2022-10-07 DIAGNOSIS — K5900 Constipation, unspecified: Secondary | ICD-10-CM | POA: Diagnosis not present

## 2022-10-07 MED ORDER — LINACLOTIDE 72 MCG PO CAPS: 72.0000 ug | ORAL_CAPSULE | Freq: Every day | ORAL | 1 refills | Status: DC

## 2022-10-07 MED ORDER — PANTOPRAZOLE SODIUM 40 MG PO TBEC: 40.0000 mg | DELAYED_RELEASE_TABLET | Freq: Every day | ORAL | 3 refills | Status: DC

## 2022-10-07 NOTE — Progress Notes (Signed)
10/07/2022 Stacy Duncan [id number] 1950-12-10   Chief Complaint: Constipation  History of Present Illness: Stacy Duncan is a 71 year old female with a past medical history of  depression, hypertension, hypercholesterolemia, carotid artery stenosis, GERD and colon polyps.    I last saw her 06/26/2021 in office due to having constipation with overflow diarrhea which resolved after she took multiple doses of MiraLAX.    She presents today for further evaluation regarding chronic constipation.  She is concerned she is blocked in the rectum as she is passing loose stools after she took a Dulcolax suppository 3 days ago. She reported passing 12 loose mushy nonbloody stools yesterday.  No abdominal or rectal pain.  Her most recent antibiotic use was 2 months ago prior to her dental procedure.  Her most recent colonoscopy was 05/30/2019 which identified 2 small tubular adenomatous polyps which were removed from the transverse colon, no evidence of colitis.  Repeat colonoscopy in 5 years was recommended.  Mother with history of colon cancer.  Her GERD symptoms are well-controlled on Pantoprazole 40 mg once daily.     Latest Ref Rng & Units 06/22/2021   12:16 PM 06/02/2021   10:52 AM 03/25/2020    1:53 PM  CBC  WBC 4.0 - 10.5 K/uL 8.7  11.9  9.1   Hemoglobin 12.0 - 15.0 g/dL 14.6  15.0  14.6   Hematocrit 36.0 - 46.0 % 43.2  43.0  42.5   Platelets 150.0 - 400.0 K/uL 324.0  374  271        Latest Ref Rng & Units 06/22/2021   12:16 PM 06/02/2021   10:52 AM 03/25/2020    1:53 PM  CMP  Glucose 70 - 99 mg/dL 95  110  124   BUN 6 - 23 mg/dL '16  22  14   '$ Creatinine 0.40 - 1.20 mg/dL 1.00  1.22  1.09   Sodium 135 - 145 mEq/L 139  136  140   Potassium 3.5 - 5.1 mEq/L 3.4  3.4  3.2   Chloride 96 - 112 mEq/L 100  99  103   CO2 19 - 32 mEq/L '27  23  26   '$ Calcium 8.4 - 10.5 mg/dL 10.1  9.7  9.0   Total Protein 6.5 - 8.1 g/dL  8.5    Total Bilirubin 0.3 - 1.2 mg/dL  1.2    Alkaline Phos 38 - 126 U/L  66     AST 15 - 41 U/L  36    ALT 0 - 44 U/L  38      Current Outpatient Medications on File Prior to Visit  Medication Sig Dispense Refill   acetaminophen (TYLENOL) 500 MG tablet Take 500 mg by mouth every 6 (six) hours as needed for moderate pain.     alendronate (FOSAMAX) 70 MG tablet Take 70 mg by mouth daily.     aspirin EC 81 MG tablet Take 1 tablet (81 mg total) by mouth 2 (two) times daily after a meal. 60 tablet 0   buPROPion (WELLBUTRIN XL) 150 MG 24 hr tablet Take 150 mg by mouth daily.     escitalopram (LEXAPRO) 10 MG tablet Take 10 mg by mouth daily.      mometasone (NASONEX) 50 MCG/ACT nasal spray Place 1 spray into the nose daily as needed (allergies.).      Multiple Vitamin (MULTIVITAMIN WITH MINERALS) TABS tablet Take 1 tablet by mouth daily.     pantoprazole (PROTONIX) 40 MG tablet  TAKE 1 TABLET BY MOUTH EVERY DAY 15 tablet 11   Probiotic Product (PROBIOTIC PO) Take 1 tablet by mouth daily.     Vitamin D, Ergocalciferol, (DRISDOL) 1.25 MG (50000 UT) CAPS capsule Take 50,000 Units by mouth every Sunday.      chlorthalidone (HYGROTON) 25 MG tablet Take 25 mg by mouth daily.  (Patient not taking: Reported on 10/07/2022)     losartan (COZAAR) 25 MG tablet Take 25 mg by mouth daily.     rosuvastatin (CRESTOR) 20 MG tablet Take 20 mg by mouth daily.      No current facility-administered medications on file prior to visit.   No Known Allergies  Current Medications, Allergies, Past Medical History, Past Surgical History, Family History and Social History were reviewed in [employer] record.  Review of Systems:   Constitutional: Negative for fever, sweats, chills or weight loss.  Respiratory: Negative for shortness of breath.   Cardiovascular: Negative for chest pain, palpitations and leg swelling.  Gastrointestinal: See HPI.  Musculoskeletal: Negative for back pain or muscle aches.  Neurological: Negative for dizziness, headaches or paresthesias.    Physical Exam: BP 110/68   Pulse 89   Ht '5\' 6"'$  (1.676 m)   Wt 209 lb (94.8 kg)   BMI 33.73 kg/m  General: 71 year old female in no acute distress. Head: Normocephalic and atraumatic. Eyes: No scleral icterus. Conjunctiva pink . Ears: Normal auditory acuity. Mouth: Dentition intact. No ulcers or lesions.  Lungs: Clear throughout to auscultation. Heart: Regular rate and rhythm, no murmur. Abdomen: Soft, nontender and nondistended. No masses or hepatomegaly. Normal bowel sounds x 4 quadrants.  Rectal: Moderate amount of thick stool in the rectal vault.  Estill Bamberg CMA present during exam. Musculoskeletal: Symmetrical with no gross deformities. Extremities: No edema. Neurological: Alert oriented x 4. No focal deficits.  Psychological: Alert and cooperative. Normal mood and affect  Assessment and Recommendations:  1) Chronic constipation with suspected overflow diarrhea.  No abdominal pain. -MiraLAX one dose at 2pm, 6pm and 10pm today -Start Linzess 102mg 1 tab p.o. to be taken 30 minutes before breakfast, start on 10/08/2022 -Patient to contact office if symptoms worsen  2) GERD, stable -Continue Pantoprazole 40 mg 1 p.o. daily -GERD diet recommended  3) History of colon polyps. Her most recent colonoscopy was 05/30/2019 which identified 2 small tubular adenomatous polyps which were removed from the transverse colon, no evidence of colitis. Mother with history of colon cancer.   -Next colonoscopy due 05/2024

## 2022-10-07 NOTE — Patient Instructions (Addendum)
Miralax- take at 2 pm, 6 pm and 10 pm (1 capful mixed in 8 oz of clear liquids such as water, Gatorade, tea)  We have sent the following medications to your pharmacy for you to pick up at your convenience: Linzess 72 mcg & Pantoprazole 40 mg  Thank you for trusting me with your gastrointestinal care!   Carl Best, CRNP

## 2022-10-08 ENCOUNTER — Encounter: Payer: Self-pay | Admitting: Nurse Practitioner

## 2022-10-12 ENCOUNTER — Other Ambulatory Visit (HOSPITAL_COMMUNITY): Payer: Self-pay

## 2022-10-12 ENCOUNTER — Telehealth: Payer: Self-pay | Admitting: Pharmacy Technician

## 2022-10-12 NOTE — Telephone Encounter (Signed)
Patient Advocate Encounter  Received notification from [facility] that prior authorization for [facility] is required.   PA submitted on 12.19.23 Key [id number] Status is pending    Luciano Cutter, CPhT Patient Advocate Phone: [phone]

## 2022-10-19 NOTE — Telephone Encounter (Signed)
Patient Advocate Encounter  Prior Authorization for Linzess 72MCG capsules has been approved through [employer].  KEY [id number]    Effective: 09-13-2022 to 10-13-2023

## 2022-10-20 NOTE — Telephone Encounter (Signed)
Left message for pt to call back  °

## 2022-10-21 NOTE — Telephone Encounter (Signed)
Pt was contacted and made aware that her Linzess PA  has been approved:  Pt verbalized understanding with all questions answered.

## 2022-12-28 ENCOUNTER — Other Ambulatory Visit: Payer: Self-pay | Admitting: Nurse Practitioner

## 2022-12-28 NOTE — Telephone Encounter (Signed)
Please advise 

## 2023-12-02 ENCOUNTER — Other Ambulatory Visit: Payer: Self-pay | Admitting: Nurse Practitioner

## 2024-06-03 ENCOUNTER — Other Ambulatory Visit: Payer: Self-pay | Admitting: Nurse Practitioner

## 2024-06-19 ENCOUNTER — Other Ambulatory Visit: Payer: Self-pay | Admitting: Nurse Practitioner

## 2024-07-16 ENCOUNTER — Encounter: Payer: Self-pay | Admitting: Internal Medicine

## 2024-08-29 ENCOUNTER — Ambulatory Visit (AMBULATORY_SURGERY_CENTER): Admitting: *Deleted

## 2024-08-29 VITALS — Ht 66.0 in | Wt 184.0 lb

## 2024-08-29 DIAGNOSIS — Z860100 Personal history of colon polyps, unspecified: Secondary | ICD-10-CM

## 2024-08-29 DIAGNOSIS — Z800 Family history of malignant neoplasm of digestive organs: Secondary | ICD-10-CM

## 2024-08-29 MED ORDER — NA SULFATE-K SULFATE-MG SULF 17.5-3.13-1.6 GM/177ML PO SOLN: 1.0000 | Freq: Once | ORAL | 0 refills | Status: AC

## 2024-08-29 NOTE — Progress Notes (Signed)
  Pt's name and DOB verified at the beginning of the pre-visit with 2 identifiers     Pt denies any difficulty with ambulating,sitting, laying down or rolling side to side   Pt has no issues moving head neck or swallowing   No egg or soy allergy known to patient    No issues known to pt with past sedation   No FH of Malignant Hyperthermia   Pt is not on home 02    Pt is not on blood thinners    Pt has frequent issues with constipation RN instructed pt to use Miralax per bottles instructions a week before prep days. Pt states they will   Pt is not on dialysis   Pt denise any abnormal heart rhythms    Pt denies any upcoming cardiac testing   Patient's chart reviewed by Norleen Schillings CNRA prior to pre-visit and patient appropriate for the [facility].  Pre-visit completed and red dot placed by patient's name on their procedure day (on provider's schedule).       Visit in person   Pt states weight is 184 lb   Pt given  both [facility] main # and MD on call # prior to instructions.  Informed pt to come in at the time discussed and is shown on PV instructions.  Pt instructed to use Single[employer].com or GoodRx for a price reduction on prep  Instructed pt where to find PV instructions in My Ch. Copy of instructions  to be sent in mail and address read back to pt to verify correct on envelope. Instructed pt on all aspects of written instructions including med holds clothing to wear and foods to eat and not eat as well as after procedure legal restrictions and to call MD on call if needed.. Pt states understanding. Instructed pt to review instructions again prior to procedure and call main # given if has any questions or any issues. Pt states they will.

## 2024-09-11 NOTE — Progress Notes (Unsigned)
  Gastroenterology History and Physical   Primary Care Physician:  Dwane Alfonso RIGGERS   Reason for Procedure:  History of colon polyps  Plan:    Colonoscopy   The patient was provided an opportunity to ask questions and all were answered. The patient agreed with the plan.   HPI: Stacy Duncan is a 73 y.o. female presenting for a surveillance colonoscopy.  She had 2 diminutive adenomas removed in 2020.  Her mother had colon cancer at an advanced age.  The patient has also had a diminutive adenoma removed previously.  That was in 2014.   Past Medical History:  Diagnosis Date   Arthritis    Carotid artery stenosis    Cataract    Constipation    Depression    Heart murmur    Hypercholesteremia    Hypertension    Low vitamin D level    Osteoporosis     Past Surgical History:  Procedure Laterality Date   ANKLE SURGERY Right    BREAST LUMPECTOMY Left    mid 70s   COLONOSCOPY  07/31/2013   ESOPHAGOGASTRODUODENOSCOPY (EGD) WITH PROPOFOL  N/A 03/25/2020   Procedure: ESOPHAGOGASTRODUODENOSCOPY (EGD) WITH PROPOFOL ;  Surgeon: Shila Gustav GAILS, MD;  Location: [facility];  Service: Endoscopy;  Laterality: N/A;   FOREIGN BODY REMOVAL  03/25/2020   Procedure: FOREIGN BODY REMOVAL;  Surgeon: Shila Gustav GAILS, MD;  Location: [facility];  Service: Endoscopy;;   KNEE SURGERY Left    POLYPECTOMY     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 60 tablet 0   buPROPion (WELLBUTRIN XL) 150 MG 24 hr tablet Take 150 mg  by mouth daily.     chlorthalidone  (HYGROTON ) 25 MG tablet Take 25 mg by mouth daily.      LINZESS  72 MCG capsule TAKE 1 CAPSULE (72 MCG TOTAL) BY MOUTH DAILY BEFORE BREAKFAST. TAKE 30 MINUTES BEFORE BREAKFAST 30 capsule 1   losartan (COZAAR) 25 MG tablet Take 25 mg by mouth daily.     mometasone (NASONEX) 50 MCG/ACT nasal spray Place 1 spray into the nose daily as needed (allergies.).      Multiple Vitamin (MULTIVITAMIN WITH MINERALS) TABS tablet Take 1 tablet by mouth daily.     Probiotic Product (PROBIOTIC PO) Take 1 tablet by mouth daily.     rosuvastatin  (CRESTOR ) 20 MG tablet Take 20 mg by mouth daily.      Vitamin D, Ergocalciferol, (DRISDOL) 1.25 MG (50000 UT) CAPS capsule Take 50,000 Units by mouth every Sunday.      acetaminophen  (TYLENOL ) 500 MG tablet Take 500 mg by mouth every 6 (six) hours as needed for moderate pain.     alendronate (FOSAMAX) 70 MG tablet Take 70 mg by mouth daily. (Patient not taking: Reported on 09/12/2024)     Cholecalciferol 125 MCG (5000 UT) TABS TAKE 1 EACH (5,000 UNITS TOTAL) BY MOUTH DAILY.     escitalopram  (LEXAPRO ) 10 MG tablet Take 10 mg by mouth daily.  (Patient not taking: Reported on 08/29/2024)  Current Facility-Administered Medications  Medication Dose Route Frequency Provider Last Rate Last Admin   0.9 %  sodium chloride  infusion  500 mL Intravenous Continuous Avram Lupita BRAVO, MD        Allergies as of 09/12/2024   (No Known Allergies)    Family History  Problem Relation Age of Onset   Colon cancer Mother 82   Colon polyps Neg Hx    Esophageal cancer Neg Hx    Rectal cancer Neg Hx    Stomach cancer Neg Hx     Social History   Socioeconomic History   Marital status: Widowed    Spouse name: Not on file   Number of children: Not on file   Years of education: Not on file   Highest education level: Not on file  Occupational History   Not on file  Tobacco Use   Smoking status: Former    Current packs/day: 0.00    Types:  Cigarettes    Quit date: 10/25/2006    Years since quitting: 17.8   Smokeless tobacco: Never  Vaping Use   Vaping status: Never Used  Substance and Sexual Activity   Alcohol use: Yes    Alcohol/week: 2.0 standard drinks of alcohol    Types: 2 Glasses of wine per week    Comment: per week   Drug use: No   Sexual activity: Not on file  Other Topics Concern   Not on file  Social History Narrative   Not on file   Social Drivers of Health   Financial Resource Strain: Not on file  Food Insecurity: Not on file  Transportation Needs: Not on file  Physical Activity: Not on file  Stress: Not on file  Social Connections: Not on file  Intimate Partner Violence: Not on file    Review of Systems:  All other review of systems negative except as mentioned in the HPI.  Physical Exam: Vital signs BP (!) 149/86   Pulse 66   Temp 97.7 F (36.5 C)   Resp 14   Ht 5' 6 (1.676 m)   Wt 184 lb (83.5 kg)   SpO2 99%   BMI 29.70 kg/m   General:   Alert,  Well-developed, well-nourished, pleasant and cooperative in NAD Lungs:  Clear throughout to auscultation.   Heart:  Regular rate and rhythm; no murmurs, clicks, rubs,  or gallops. Abdomen:  Soft, nontender and nondistended. Normal bowel sounds.   Neuro/Psych:  Alert and cooperative. Normal mood and affect. A and O x 3   @Gimena Buick  CHARLENA Avram, MD, [facility] [phone] (pager) 09/12/2024 11:32 AM@

## 2024-09-12 ENCOUNTER — Ambulatory Visit (AMBULATORY_SURGERY_CENTER): Admitting: Internal Medicine

## 2024-09-12 ENCOUNTER — Encounter: Payer: Self-pay | Admitting: Internal Medicine

## 2024-09-12 VITALS — BP 157/67 | HR 63 | Temp 97.7°F | Resp 14 | Ht 66.0 in | Wt 184.0 lb

## 2024-09-12 DIAGNOSIS — Z800 Family history of malignant neoplasm of digestive organs: Secondary | ICD-10-CM

## 2024-09-12 DIAGNOSIS — D123 Benign neoplasm of transverse colon: Secondary | ICD-10-CM | POA: Diagnosis not present

## 2024-09-12 DIAGNOSIS — D124 Benign neoplasm of descending colon: Secondary | ICD-10-CM

## 2024-09-12 DIAGNOSIS — K649 Unspecified hemorrhoids: Secondary | ICD-10-CM | POA: Diagnosis not present

## 2024-09-12 DIAGNOSIS — Z1211 Encounter for screening for malignant neoplasm of colon: Secondary | ICD-10-CM | POA: Diagnosis not present

## 2024-09-12 DIAGNOSIS — K6289 Other specified diseases of anus and rectum: Secondary | ICD-10-CM

## 2024-09-12 DIAGNOSIS — Z860101 Personal history of adenomatous and serrated colon polyps: Secondary | ICD-10-CM

## 2024-09-12 DIAGNOSIS — Z860100 Personal history of colon polyps, unspecified: Secondary | ICD-10-CM

## 2024-09-12 MED ORDER — SODIUM CHLORIDE 0.9 % IV SOLN: 500.0000 mL | INTRAVENOUS | Status: DC

## 2024-09-12 NOTE — Progress Notes (Unsigned)
 Called to room to assist during endoscopic procedure.  Patient ID and intended procedure confirmed with present staff. Received instructions for my participation in the procedure from the performing physician.

## 2024-09-12 NOTE — Progress Notes (Unsigned)
 Report given to PACU, vss

## 2024-09-12 NOTE — Patient Instructions (Addendum)
 I found and removed 1 tiny polyp.  That was the only significant abnormality.  Since your mother had colon cancer at an advanced age, you may want to consider repeating a colonoscopy in 5 years.  Although I do not think you need to.  I will let you know pathology results and when/if to have another routine colonoscopy by mail and/or My Chart.  I appreciate the opportunity to care for you. Lupita CHARLENA Commander, MD, [facility]  Resume previous diet.  Continue present medications.  Await pathology results.   YOU HAD AN ENDOSCOPIC PROCEDURE TODAY AT THE [facility]:   Refer to the procedure report that was given to you for any specific questions about what was found during the examination.  If the procedure report does not answer your questions, please call your gastroenterologist to clarify.  If you requested that your care partner not be given the details of your procedure findings, then the procedure report has been included in a sealed envelope for you to review at your convenience later.  YOU SHOULD EXPECT: Some feelings of bloating in the abdomen. Passage of more gas than usual.  Walking can help get rid of the air that was put into your GI tract during the procedure and reduce the bloating. If you had a lower endoscopy (such as a colonoscopy or flexible sigmoidoscopy) you may notice spotting of blood in your stool or on the toilet paper. If you underwent a bowel prep for your procedure, you may not have a normal bowel movement for a few days.  Please Note:  You might notice some irritation and congestion in your nose or some drainage.  This is from the oxygen used during your procedure.  There is no need for concern and it should clear up in a day or so.  SYMPTOMS TO REPORT IMMEDIATELY:  Following lower endoscopy (colonoscopy or flexible sigmoidoscopy):  Excessive amounts of blood in the stool  Significant tenderness or worsening of abdominal pains  Swelling of the abdomen that is new,  acute  Fever of 100F or higher  For urgent or emergent issues, a gastroenterologist can be reached at any hour by calling [phone]. Do not use MyChart messaging for urgent concerns.    DIET:  We do recommend a small meal at first, but then you may proceed to your regular diet.  Drink plenty of fluids but you should avoid alcoholic beverages for 24 hours.  ACTIVITY:  You should plan to take it easy for the rest of today and you should NOT DRIVE or use heavy machinery until tomorrow (because of the sedation medicines used during the test).    FOLLOW UP: Our staff will call the number listed on your records the next business day following your procedure.  We will call around 7:15- 8:00 am to check on you and address any questions or concerns that you may have regarding the information given to you following your procedure. If we do not reach you, we will leave a message.     If any biopsies were taken you will be contacted by phone or by letter within the next 1-3 weeks.  Please call us  at [phone] if you have not heard about the biopsies in 3 weeks.    SIGNATURES/CONFIDENTIALITY: You and/or your care partner have signed paperwork which will be entered into your electronic medical record.  These signatures attest to the fact that that the information above on your After Visit Summary has been reviewed and  is understood.  Full responsibility of the confidentiality of this discharge information lies with you and/or your care-partner.

## 2024-09-12 NOTE — Op Note (Signed)
 [facility] Patient Name: Stacy Duncan Procedure Date: 09/12/2024 11:24 AM MRN: [id number] Endoscopist: Lupita FORBES Commander , MD, [id number] Age: 73 Referring MD:  Date of Birth: [birth year] Gender: Female Account #: [id number] Procedure:                Colonoscopy Indications:              Surveillance: Personal history of adenomatous                            polyps on last colonoscopy 5 years ago, Last                            colonoscopy: 2020 Medicines:                Monitored Anesthesia Care Procedure:                Pre-Anesthesia Assessment:                           - Prior to the procedure, a History and Physical                            was performed, and patient medications and                            allergies were reviewed. The patient's tolerance of                            previous anesthesia was also reviewed. The risks                            and benefits of the procedure and the sedation                            options and risks were discussed with the patient.                            All questions were answered, and informed consent                            was obtained. Prior Anticoagulants: The patient has                            taken no anticoagulant or antiplatelet agents. ASA                            Grade Assessment: II - A patient with mild systemic                            disease. After reviewing the risks and benefits,                            the patient was deemed in satisfactory condition to  undergo the procedure.                           After obtaining informed consent, the colonoscope                            was passed under direct vision. Throughout the                            procedure, the patient's blood pressure, pulse, and                            oxygen saturations were monitored continuously. The                            PCF-HQ190L Colonoscope [id number] was introduced                             through the anus and advanced to the the cecum,                            identified by appendiceal orifice and ileocecal                            valve. The colonoscopy was performed without                            difficulty. The patient tolerated the procedure                            well. The quality of the bowel preparation was                            good. The ileocecal valve, appendiceal orifice, and                            rectum were photographed. The bowel preparation                            used was SUPREP via split dose instruction. Scope In: 11:40:15 AM Scope Out: 11:54:42 AM Scope Withdrawal Time: 0 hours 11 minutes 9 seconds  Total Procedure Duration: 0 hours 14 minutes 27 seconds  Findings:                 Hemorrhoids were found on perianal exam.                           A 5 mm polyp was found in the transverse colon. The                            polyp was sessile. The polyp was removed with a                            cold snare. Resection and retrieval were complete.  Verification of patient identification for the                            specimen was done. Estimated blood loss was minimal.                           Anal papilla(e) were hypertrophied.                           The exam was otherwise without abnormality on                            direct and retroflexion views. Complications:            No immediate complications. Estimated Blood Loss:     Estimated blood loss was minimal. Impression:               - Hemorrhoids found on perianal exam.                           - One 5 mm polyp in the transverse colon, removed                            with a cold snare. Resected and retrieved.                           - Anal papilla(e) were hypertrophied.                           - The examination was otherwise normal on direct                            and retroflexion views.                            - Personal history of colonic polyps. 07/2013 -                            diminutive adenoma                           05/30/2019 2 diminutive transverse polyps - adenomas                           Also FHx CRCA in elderly mother Recommendation:           - Patient has a contact number available for                            emergencies. The signs and symptoms of potential                            delayed complications were discussed with the                            patient. Return to normal activities tomorrow.  Written discharge instructions were provided to the                            patient.                           - Resume previous diet.                           - Continue present medications.                           - Await pathology results.                           - No recommendation at this time regarding repeat                            colonoscopy due to age. She may want to repeat in 5                            years given family hx. Lupita FORBES Commander, MD 09/12/2024 12:05:56 PM This report has been signed electronically.

## 2024-09-13 ENCOUNTER — Telehealth: Payer: Self-pay | Admitting: *Deleted

## 2024-09-13 NOTE — Telephone Encounter (Signed)
  Follow up Call-     09/12/2024   10:48 AM  Call back number  Post procedure Call Back phone  # [phone]  Permission to leave phone message Yes     Patient questions:  Do you have a fever, pain , or abdominal swelling? No. Pain Score  0 *  Have you tolerated food without any problems? Yes.    Have you been able to return to your normal activities? Yes.    Do you have any questions about your discharge instructions: Diet   No. Medications  No. Follow up visit  No.  Do you have questions or concerns about your Care? No.  Actions: * If pain score is 4 or above: No action needed, pain <4.

## 2024-09-14 LAB — SURGICAL PATHOLOGY

## 2024-09-19 ENCOUNTER — Ambulatory Visit: Payer: Self-pay | Admitting: Internal Medicine

## 2024-09-19 DIAGNOSIS — Z860100 Personal history of colon polyps, unspecified: Secondary | ICD-10-CM

## 2024-09-24 ENCOUNTER — Other Ambulatory Visit: Payer: Self-pay | Admitting: Internal Medicine
